# Patient Record
Sex: Female | Born: 1960 | Race: Black or African American | Hispanic: No | Marital: Single | Smoking: Former smoker
Health system: Southern US, Community
[De-identification: ages and names within clinical notes are randomized; demographics above are authoritative.]

## PROBLEM LIST (undated history)

## (undated) DIAGNOSIS — K219 Gastro-esophageal reflux disease without esophagitis: Secondary | ICD-10-CM

## (undated) DIAGNOSIS — I10 Essential (primary) hypertension: Secondary | ICD-10-CM

## (undated) DIAGNOSIS — IMO0001 Reserved for inherently not codable concepts without codable children: Secondary | ICD-10-CM

## (undated) DIAGNOSIS — K279 Peptic ulcer, site unspecified, unspecified as acute or chronic, without hemorrhage or perforation: Secondary | ICD-10-CM

## (undated) HISTORY — PX: UPPER GI ENDOSCOPY: SHX6162

## (undated) HISTORY — PX: COLONOSCOPY: SHX174

## (undated) HISTORY — PX: TUBAL LIGATION: SHX77

## (undated) HISTORY — PX: ESOPHAGEAL DILATION: SHX303

---

## 2002-02-09 ENCOUNTER — Emergency Department (HOSPITAL_COMMUNITY): Admission: EM | Admit: 2002-02-09 | Discharge: 2002-02-09 | Payer: Self-pay | Admitting: Emergency Medicine

## 2003-12-27 ENCOUNTER — Emergency Department (HOSPITAL_COMMUNITY): Admission: EM | Admit: 2003-12-27 | Discharge: 2003-12-27 | Payer: Self-pay | Admitting: Emergency Medicine

## 2004-03-10 ENCOUNTER — Emergency Department (HOSPITAL_COMMUNITY): Admission: EM | Admit: 2004-03-10 | Discharge: 2004-03-10 | Payer: Self-pay | Admitting: Emergency Medicine

## 2004-06-29 ENCOUNTER — Emergency Department (HOSPITAL_COMMUNITY): Admission: EM | Admit: 2004-06-29 | Discharge: 2004-06-29 | Payer: Self-pay | Admitting: Emergency Medicine

## 2004-07-14 ENCOUNTER — Encounter: Payer: Self-pay | Admitting: Internal Medicine

## 2004-07-18 ENCOUNTER — Encounter: Payer: Self-pay | Admitting: Internal Medicine

## 2004-07-18 ENCOUNTER — Encounter: Admission: RE | Admit: 2004-07-18 | Discharge: 2004-07-18 | Payer: Self-pay | Admitting: Gastroenterology

## 2004-07-21 ENCOUNTER — Encounter: Payer: Self-pay | Admitting: Internal Medicine

## 2005-12-31 ENCOUNTER — Emergency Department (HOSPITAL_COMMUNITY): Admission: EM | Admit: 2005-12-31 | Discharge: 2005-12-31 | Payer: Self-pay | Admitting: Emergency Medicine

## 2006-10-22 ENCOUNTER — Emergency Department (HOSPITAL_COMMUNITY): Admission: EM | Admit: 2006-10-22 | Discharge: 2006-10-22 | Payer: Self-pay | Admitting: Emergency Medicine

## 2007-01-09 ENCOUNTER — Emergency Department (HOSPITAL_COMMUNITY): Admission: EM | Admit: 2007-01-09 | Discharge: 2007-01-09 | Payer: Self-pay | Admitting: Emergency Medicine

## 2007-04-23 ENCOUNTER — Encounter: Payer: Self-pay | Admitting: Internal Medicine

## 2007-06-05 ENCOUNTER — Ambulatory Visit (HOSPITAL_COMMUNITY): Admission: RE | Admit: 2007-06-05 | Discharge: 2007-06-05 | Payer: Self-pay | Admitting: Obstetrics

## 2008-09-10 ENCOUNTER — Encounter: Payer: Self-pay | Admitting: Internal Medicine

## 2008-09-10 ENCOUNTER — Ambulatory Visit: Payer: Self-pay | Admitting: Internal Medicine

## 2008-09-10 DIAGNOSIS — D509 Iron deficiency anemia, unspecified: Secondary | ICD-10-CM | POA: Insufficient documentation

## 2008-09-10 DIAGNOSIS — I10 Essential (primary) hypertension: Secondary | ICD-10-CM | POA: Insufficient documentation

## 2008-09-10 DIAGNOSIS — K219 Gastro-esophageal reflux disease without esophagitis: Secondary | ICD-10-CM | POA: Insufficient documentation

## 2008-09-10 LAB — CONVERTED CEMR LAB
BUN: 14 mg/dL (ref 6–23)
CO2: 25 meq/L (ref 19–32)
Calcium: 9.1 mg/dL (ref 8.4–10.5)
Chloride: 106 meq/L (ref 96–112)
Creatinine, Ser: 0.7 mg/dL (ref 0.40–1.20)
Glucose, Bld: 98 mg/dL (ref 70–99)
HCT: 37.4 % (ref 36.0–46.0)
Hemoglobin: 11.4 g/dL — ABNORMAL LOW (ref 12.0–15.0)
MCHC: 30.5 g/dL (ref 30.0–36.0)
MCV: 83.7 fL (ref 78.0–100.0)
Platelets: 333 10*3/uL (ref 150–400)
Potassium: 5 meq/L (ref 3.5–5.3)
RBC: 4.47 M/uL (ref 3.87–5.11)
RDW: 20.3 % — ABNORMAL HIGH (ref 11.5–15.5)
Sodium: 141 meq/L (ref 135–145)
WBC: 7.9 10*3/uL (ref 4.0–10.5)

## 2009-11-09 ENCOUNTER — Emergency Department (HOSPITAL_COMMUNITY): Admission: EM | Admit: 2009-11-09 | Discharge: 2009-11-09 | Payer: Self-pay | Admitting: Family Medicine

## 2010-05-27 LAB — POCT URINALYSIS DIPSTICK
Glucose, UA: 100 mg/dL — AB
Ketones, ur: 15 mg/dL — AB
Nitrite: POSITIVE — AB
Protein, ur: >=300 mg/dL — AB
Specific Gravity, Urine: >=1.03 (ref 1.005–1.030)
Urobilinogen, UA: 2 mg/dL — ABNORMAL HIGH (ref 0.0–1.0)
pH: 5 (ref 5.0–8.0)

## 2010-07-29 NOTE — Op Note (Signed)
NAMEMARIABELEN, Wendy Jensen NO.:  [id number]   MEDICAL RECORD NO.:  [id number]          PATIENT TYPE:  EMS   LOCATION:  MAJO                         FACILITY:  [facility]   PHYSICIAN:  Anselmo Rod, M.D.  DATE OF BIRTH:  1961/01/31   DATE OF PROCEDURE:  06/29/2004  DATE OF DISCHARGE:  06/29/2004                                 OPERATIVE REPORT   PROCEDURE PERFORMED:  Esophagogastroduodenoscopy with disimpaction of a meat  bolus.   ENDOSCOPIST:  Charna Elizabeth, M.D.   INSTRUMENT USED:  Olympus video panendoscope.   INDICATIONS FOR PROCEDURE:  50 year old African American female with a  history of recurrent meat impactions undergoing EGD to remove the meat  bolus.   PREPROCEDURE PREPARATION:  Informed consent was procured from the patient.  The patient was in a fasting state for four hours prior to the procedure and  received Glucagon which did not help her symptoms.   PREPROCEDURE PHYSICAL:  Patient with stable vital signs.  Neck supple.  Chest clear to auscultation.  S1 and S2 regular.  Abdomen soft with normal  bowel sounds.   DESCRIPTION OF PROCEDURE:  The patient was placed in the left lateral  decubitus position, sedated with 75 mg of Demerol and 10 mg Versed in slow,  incremental doses.  Once the patient was adequately sedated, maintained on  low flow oxygen and continuous cardiac monitoring, the Olympus video  panendoscope was advanced through the mouth piece over the tongue into the  esophagus under direct vision.  A large piece of chicken was impacted in the  distal esophagus and was gently pushed into the stomach.  There was a large  amount of debris in the stomach and the procedure was aborted at this point  with plans to rescope the patient at a later date.   IMPRESSION:  1.  A large piece of chicken moved from distal esophagus into the stomach.  2.  A large amount of food in the stomach.  3.  Procedure aborted at that point.   RECOMMENDATIONS:  OTC  Prilosec has been advised.  Outpatient follow up  advised in the next two weeks, earlier if need be.  A soft diet is  recommended in the interim.      JNM/MEDQ  D:  07/01/2004  T:  07/01/2004  Job:  [id number]   cc:   Janine Limbo, M.D.  [address]  [place]  [place] [address]  Fax: [phone]

## 2010-12-26 LAB — WET PREP, GENITAL
Trich, Wet Prep: NONE SEEN
WBC, Wet Prep HPF POC: NONE SEEN
Yeast Wet Prep HPF POC: NONE SEEN

## 2010-12-26 LAB — CBC
HCT: 34.6 — ABNORMAL LOW
Hemoglobin: 11.5 — ABNORMAL LOW
MCHC: 33.3
MCV: 86.5
Platelets: 369
RBC: 4.01
RDW: 18.4 — ABNORMAL HIGH
WBC: 7.2

## 2010-12-26 LAB — DIFFERENTIAL
Basophils Absolute: 0.1
Basophils Relative: 1
Eosinophils Absolute: 0.3
Eosinophils Relative: 4
Lymphocytes Relative: 36
Lymphs Abs: 2.6
Monocytes Absolute: 0.5
Monocytes Relative: 6
Neutro Abs: 3.8
Neutrophils Relative %: 54

## 2010-12-26 LAB — GC/CHLAMYDIA PROBE AMP, GENITAL
Chlamydia, DNA Probe: NEGATIVE
GC Probe Amp, Genital: NEGATIVE

## 2010-12-26 LAB — POCT PREGNANCY, URINE
Operator id: 28833
Preg Test, Ur: NEGATIVE

## 2010-12-26 LAB — RPR: RPR Ser Ql: NONREACTIVE

## 2013-04-04 ENCOUNTER — Other Ambulatory Visit (HOSPITAL_COMMUNITY): Payer: Self-pay | Admitting: Obstetrics

## 2013-04-04 DIAGNOSIS — Z1231 Encounter for screening mammogram for malignant neoplasm of breast: Secondary | ICD-10-CM

## 2013-04-10 ENCOUNTER — Ambulatory Visit (HOSPITAL_COMMUNITY)
Admission: RE | Admit: 2013-04-10 | Discharge: 2013-04-10 | Disposition: A | Discharge status: Home / Self Care | Payer: BC Managed Care – PPO | Source: Ambulatory Visit | Attending: Obstetrics | Admitting: Obstetrics

## 2013-04-10 DIAGNOSIS — Z1231 Encounter for screening mammogram for malignant neoplasm of breast: Secondary | ICD-10-CM | POA: Insufficient documentation

## 2014-07-30 ENCOUNTER — Emergency Department (HOSPITAL_COMMUNITY)
Admission: EM | Admit: 2014-07-30 | Discharge: 2014-07-30 | Disposition: A | Discharge status: Home / Self Care | Payer: 59 | Attending: Emergency Medicine | Admitting: Emergency Medicine

## 2014-07-30 ENCOUNTER — Encounter (HOSPITAL_COMMUNITY): Payer: Self-pay | Admitting: Emergency Medicine

## 2014-07-30 DIAGNOSIS — S161XXA Strain of muscle, fascia and tendon at neck level, initial encounter: Secondary | ICD-10-CM | POA: Insufficient documentation

## 2014-07-30 DIAGNOSIS — Y939 Activity, unspecified: Secondary | ICD-10-CM | POA: Diagnosis not present

## 2014-07-30 DIAGNOSIS — S0990XA Unspecified injury of head, initial encounter: Secondary | ICD-10-CM | POA: Insufficient documentation

## 2014-07-30 DIAGNOSIS — S3992XA Unspecified injury of lower back, initial encounter: Secondary | ICD-10-CM | POA: Diagnosis present

## 2014-07-30 DIAGNOSIS — S46911A Strain of unspecified muscle, fascia and tendon at shoulder and upper arm level, right arm, initial encounter: Secondary | ICD-10-CM | POA: Insufficient documentation

## 2014-07-30 DIAGNOSIS — S46912A Strain of unspecified muscle, fascia and tendon at shoulder and upper arm level, left arm, initial encounter: Secondary | ICD-10-CM | POA: Insufficient documentation

## 2014-07-30 DIAGNOSIS — Y999 Unspecified external cause status: Secondary | ICD-10-CM | POA: Diagnosis not present

## 2014-07-30 DIAGNOSIS — Y92410 Unspecified street and highway as the place of occurrence of the external cause: Secondary | ICD-10-CM | POA: Diagnosis not present

## 2014-07-30 DIAGNOSIS — S39012A Strain of muscle, fascia and tendon of lower back, initial encounter: Secondary | ICD-10-CM | POA: Insufficient documentation

## 2014-07-30 DIAGNOSIS — Z8719 Personal history of other diseases of the digestive system: Secondary | ICD-10-CM | POA: Insufficient documentation

## 2014-07-30 DIAGNOSIS — S29019A Strain of muscle and tendon of unspecified wall of thorax, initial encounter: Secondary | ICD-10-CM | POA: Diagnosis not present

## 2014-07-30 DIAGNOSIS — T148XXA Other injury of unspecified body region, initial encounter: Secondary | ICD-10-CM

## 2014-07-30 HISTORY — DX: Reserved for inherently not codable concepts without codable children: IMO0001

## 2014-07-30 HISTORY — DX: Gastro-esophageal reflux disease without esophagitis: K21.9

## 2014-07-30 MED ORDER — METHOCARBAMOL 500 MG PO TABS: 500.0000 mg | ORAL_TABLET | Freq: Four times a day (QID) | ORAL | Status: DC
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
## 2014-07-30 NOTE — ED Notes (Signed)
Patient reports pain on the left side of the lower back. Patient ambulated into the room. Patient alert and oriented x4.

## 2014-07-30 NOTE — ED Notes (Signed)
PA at the bedside.

## 2015-10-05 ENCOUNTER — Emergency Department (HOSPITAL_COMMUNITY)
Admission: EM | Admit: 2015-10-05 | Discharge: 2015-10-05 | Disposition: A | Discharge status: Home / Self Care | Payer: Self-pay | Attending: Emergency Medicine | Admitting: Emergency Medicine

## 2015-10-05 ENCOUNTER — Encounter (HOSPITAL_COMMUNITY): Payer: Self-pay | Admitting: Vascular Surgery

## 2015-10-05 DIAGNOSIS — K029 Dental caries, unspecified: Secondary | ICD-10-CM | POA: Insufficient documentation

## 2015-10-05 DIAGNOSIS — K0889 Other specified disorders of teeth and supporting structures: Secondary | ICD-10-CM

## 2015-10-05 DIAGNOSIS — F17210 Nicotine dependence, cigarettes, uncomplicated: Secondary | ICD-10-CM | POA: Insufficient documentation

## 2015-10-05 DIAGNOSIS — Z79899 Other long term (current) drug therapy: Secondary | ICD-10-CM | POA: Insufficient documentation

## 2015-10-05 DIAGNOSIS — I10 Essential (primary) hypertension: Secondary | ICD-10-CM | POA: Insufficient documentation

## 2015-10-05 MED ORDER — PENICILLIN V POTASSIUM 500 MG PO TABS: 500.0000 mg | ORAL_TABLET | Freq: Four times a day (QID) | ORAL | 0 refills | Status: DC

## 2015-10-05 NOTE — ED Provider Notes (Signed)
[facility] Provider Note   CSN: [id number] Arrival date & time: 10/05/15  4098  First Provider Contact: First MD initiated contact with patient 10/05/15 9:30 AM     By signing my name below, I, Doreatha Martin, attest that this documentation has been prepared under the direction and in the presence of Roxy Horseman, PA-C. Electronically Signed: Doreatha Martin, ED Scribe. 10/05/15. 9:34 AM.   History   Chief Complaint Chief Complaint  Patient presents with  . Dental Pain    HPI Wendy Jensen is a 55 y.o. female who presents to the Emergency Department complaining of moderate, chronic bilateral dental pain onset this week. Pt notes that she has loose tooth and cavities that contribute to her pain. She reports her pain is worsened with closing her jaw or eating. She notes that her pain is not relieved by [facility] Powder. She is not currently followed by a dentist. She denies fever, difficulty swallowing or tolerating secretions. NKDA.   The history is provided by the patient. No language interpreter was used.    Past Medical History:  Diagnosis Date  . Reflux     Patient Active Problem List   Diagnosis Date Noted  . ANEMIA, IRON DEFICIENCY 09/10/2008  . HYPERTENSION 09/10/2008  . GERD 09/10/2008    Past Surgical History:  Procedure Laterality Date  . ESOPHAGEAL DILATION    . TUBAL LIGATION      OB History    No data available       Home Medications    Prior to Admission medications   Medication Sig Start Date End Date Taking? Authorizing Provider  methocarbamol (ROBAXIN) 500 MG tablet Take 1 tablet (500 mg total) by mouth 4 (four) times daily. 07/30/14   Renne Crigler, PA-C    Family History No family history on file.  Social History Social History  Substance Use Topics  . Smoking status: Current Every Day Smoker    Packs/day: 0.50    Types: Cigarettes  . Smokeless tobacco: Never Used  . Alcohol use Yes     Comment: 1 glass beer per day      Allergies   Review of patient's allergies indicates no known allergies.   Review of Systems Review of Systems  Constitutional: Negative for fever.  HENT: Positive for dental problem. Negative for trouble swallowing.      Physical Exam Updated Vital Signs BP 147/100 (BP Location: Right Arm)   Pulse 90   Temp 98.7 F (37.1 C) (Oral)   Resp 18   Ht  (1.626 m)   Wt 180 lb (81.6 kg)   SpO2 97%   BMI 30.90 kg/m   Physical Exam Physical Exam  Constitutional: Pt appears well-developed and well-nourished.  HENT:  Head: Normocephalic.  Right Ear: Tympanic membrane, external ear and ear canal normal.  Left Ear: Tympanic membrane, external ear and ear canal normal.  Nose: Nose normal. Right sinus exhibits no maxillary sinus tenderness and no frontal sinus tenderness. Left sinus exhibits no maxillary sinus tenderness and no frontal sinus tenderness.  Mouth/Throat: Uvula is midline, oropharynx is clear and moist and mucous membranes are normal. No oral lesions. No uvula swelling or lacerations. No oropharyngeal exudate, posterior oropharyngeal edema, posterior oropharyngeal erythema or tonsillar abscesses.  Poor dentition No gingival swelling, fluctuance or induration No gross abscess  No sublingual edema, tenderness to palpation, or sign of Ludwig's angina, or deep space infection Pain at right rear lower molars Eyes: Conjunctivae are normal. Pupils are equal, round, and reactive  to light. Right eye exhibits no discharge. Left eye exhibits no discharge.  Neck: Normal range of motion. Neck supple.  No stridor Handling secretions without difficulty No nuchal rigidity No cervical lymphadenopathy Cardiovascular: Normal rate, regular rhythm and normal heart sounds.   Pulmonary/Chest: Effort normal. No respiratory distress.  Equal chest rise  Abdominal: Soft. Bowel sounds are normal. Pt exhibits no distension. There is no tenderness.  Lymphadenopathy: Pt has no cervical  adenopathy.  Neurological: Pt is alert and oriented x 4  Skin: Skin is warm and dry.  Psychiatric: Pt has a normal mood and affect.  Nursing note and vitals reviewed.    ED Treatments / Results  Labs (all labs ordered are listed, but only abnormal results are displayed) Labs Reviewed - No data to display  EKG  EKG Interpretation None       Radiology No results found.  Procedures Procedures (including critical care time) DIAGNOSTIC STUDIES: Oxygen Saturation is 97% on RA, normal by my interpretation.    COORDINATION OF CARE: 9:32 AM Discussed treatment plan with pt at bedside which includes dental referral and pt agreed to plan.    Medications Ordered in ED Medications - No data to display   Initial Impression / Assessment and Plan / ED Course  I have reviewed the triage vital signs and the nursing notes.  Pertinent labs & imaging results that were available during my care of the patient were reviewed by me and considered in my medical decision making (see chart for details).  Clinical Course    Patient with dentalgia.  No abscess requiring immediate incision and drainage.  Exam not concerning for Ludwig's angina or pharyngeal abscess.  Will treat with Penicillin. Pt instructed to follow-up with dentist.  Discussed return precautions. Pt safe for discharge.   Final Clinical Impressions(s) / ED Diagnoses   Final diagnoses:  Pain, dental  Dental caries    New Prescriptions New Prescriptions   No medications on file    I personally performed the services described in this documentation, which was scribed in my presence. The recorded information has been reviewed and is accurate.      Roxy Horseman, PA-C 10/05/15 8756    Raeford Razor, MD 10/09/15 1500

## 2015-10-05 NOTE — ED Triage Notes (Signed)
Pt reports to the ED for eval of dental pain x 1 week. She reports she has 3 loose teeth and it is so painful that is makes it hard to eat. Pt requesting dental resource guide. Pt A&Ox4, resp e/u, and skin warm and dry.

## 2015-11-02 ENCOUNTER — Emergency Department (HOSPITAL_COMMUNITY)
Admission: EM | Admit: 2015-11-02 | Discharge: 2015-11-02 | Disposition: A | Discharge status: Home / Self Care | Payer: BLUE CROSS/BLUE SHIELD | Attending: Emergency Medicine | Admitting: Emergency Medicine

## 2015-11-02 ENCOUNTER — Encounter (HOSPITAL_COMMUNITY): Payer: Self-pay | Admitting: Emergency Medicine

## 2015-11-02 DIAGNOSIS — I10 Essential (primary) hypertension: Secondary | ICD-10-CM | POA: Insufficient documentation

## 2015-11-02 DIAGNOSIS — F17210 Nicotine dependence, cigarettes, uncomplicated: Secondary | ICD-10-CM | POA: Diagnosis not present

## 2015-11-02 DIAGNOSIS — Z79899 Other long term (current) drug therapy: Secondary | ICD-10-CM | POA: Insufficient documentation

## 2015-11-02 DIAGNOSIS — R591 Generalized enlarged lymph nodes: Secondary | ICD-10-CM | POA: Insufficient documentation

## 2015-11-02 LAB — CBC WITH DIFFERENTIAL/PLATELET
Basophils Absolute: 0.1 10*3/uL (ref 0.0–0.1)
Basophils Relative: 2 %
Eosinophils Absolute: 0.1 10*3/uL (ref 0.0–0.7)
Eosinophils Relative: 2 %
HCT: 43.8 % (ref 36.0–46.0)
Hemoglobin: 14.3 g/dL (ref 12.0–15.0)
Lymphocytes Relative: 40 %
Lymphs Abs: 2.9 10*3/uL (ref 0.7–4.0)
MCH: 30.6 pg (ref 26.0–34.0)
MCHC: 32.6 g/dL (ref 30.0–36.0)
MCV: 93.6 fL (ref 78.0–100.0)
Monocytes Absolute: 0.5 10*3/uL (ref 0.1–1.0)
Monocytes Relative: 7 %
Neutro Abs: 3.6 10*3/uL (ref 1.7–7.7)
Neutrophils Relative %: 49 %
Platelets: 221 10*3/uL (ref 150–400)
RBC: 4.68 MIL/uL (ref 3.87–5.11)
RDW: 13.2 % (ref 11.5–15.5)
WBC: 7.2 10*3/uL (ref 4.0–10.5)

## 2015-11-02 LAB — BASIC METABOLIC PANEL
Anion gap: 5 (ref 5–15)
BUN: 13 mg/dL (ref 6–20)
CO2: 28 mmol/L (ref 22–32)
Calcium: 9.1 mg/dL (ref 8.9–10.3)
Chloride: 106 mmol/L (ref 101–111)
Creatinine, Ser: 0.71 mg/dL (ref 0.44–1.00)
GFR calc Af Amer: >60 mL/min (ref >60)
GFR calc non Af Amer: >60 mL/min (ref >60)
Glucose, Bld: 91 mg/dL (ref 65–99)
Potassium: 4 mmol/L (ref 3.5–5.1)
Sodium: 139 mmol/L (ref 135–145)

## 2015-11-02 NOTE — ED Provider Notes (Signed)
[facility] Provider Note   CSN: [id number] Arrival date & time: 11/02/15  1112     History   Chief Complaint Chief Complaint  Patient presents with  . Lymphadenopathy    HPI Wendy Jensen is a 55 y.o. female.  HPI   Pt is a 55 y.o. Female with a history of anemia, HTN and GERD who presents to the emergency department with swollen lymph nodes on both sides of her neck for 3 days. She states they are only painful to touch. She has not tried anything and nothing makes them better. Pt had teeth removed roughly 3 weeks ago and only took part of her penicillin. She took 2 days worth of penicillin in the last 2 days without change in the lymph nodes. Pt is complaining of neck tightness on the right side since this morning. Pt believes she slept on her neck wrong as she was trying to avoid putting pressure on the nodes. Pt denies headache, sore throat, dental pain, swelling of her gingiva, fevers, nausea, vomiting, visual changes.   Past Medical History:  Diagnosis Date  . Reflux     Patient Active Problem List   Diagnosis Date Noted  . ANEMIA, IRON DEFICIENCY 09/10/2008  . HYPERTENSION 09/10/2008  . GERD 09/10/2008    Past Surgical History:  Procedure Laterality Date  . ESOPHAGEAL DILATION    . TUBAL LIGATION      OB History    No data available       Home Medications    Prior to Admission medications   Medication Sig Start Date End Date Taking? Authorizing Provider  OMEPRAZOLE PO Take by mouth.   Yes Historical Provider, MD  methocarbamol (ROBAXIN) 500 MG tablet Take 1 tablet (500 mg total) by mouth 4 (four) times daily. 07/30/14   Renne CriglerJoshua Geiple, PA-C  penicillin v potassium (VEETID) 500 MG tablet Take 1 tablet (500 mg total) by mouth 4 (four) times daily. 10/05/15   Roxy Horsemanobert Browning, PA-C    Family History History reviewed. No pertinent family history.  Social History Social History  Substance Use Topics  . Smoking status: Current Every Day Smoker      Packs/day: 0.50    Types: Cigarettes  . Smokeless tobacco: Never Used  . Alcohol use Yes     Comment: 1 glass beer per day     Allergies   Review of patient's allergies indicates no known allergies.   Review of Systems Review of Systems  Constitutional: Negative for chills, diaphoresis and fever.  HENT: Positive for congestion and postnasal drip. Negative for dental problem, ear pain, sore throat and trouble swallowing.        Swollen lymph nodes  Eyes: Negative for visual disturbance.  Respiratory: Negative for chest tightness and shortness of breath.   Cardiovascular: Negative for chest pain.  Gastrointestinal: Negative for abdominal pain, nausea and vomiting.  Genitourinary: Negative for dysuria and hematuria.  Musculoskeletal: Negative for back pain.  Skin: Negative for rash.  Neurological: Negative for dizziness, syncope and headaches.     Physical Exam Updated Vital Signs BP (!) 159/105 (BP Location: Right Arm)   Pulse 71   Temp 98.1 F (36.7 C) (Oral)   Resp 18   SpO2 96%   Physical Exam  Constitutional: She appears well-developed and well-nourished. No distress.  HENT:  Head: Normocephalic and atraumatic.  Right Ear: Tympanic membrane and ear canal normal.  Left Ear: Tympanic membrane and ear canal normal.  Mouth/Throat: Uvula is midline, oropharynx is clear  and moist and mucous membranes are normal. No trismus in the jaw. Abnormal dentition. No dental abscesses or uvula swelling.  Eyes: Conjunctivae are normal.  Neck: Trachea normal and normal range of motion. Muscular tenderness (trapezius right > left) present. No tracheal tenderness and no spinous process tenderness present. No neck rigidity. No tracheal deviation, no erythema and normal range of motion present. No thyroid mass and no thyromegaly present.  Cardiovascular: Normal rate and normal heart sounds.  Exam reveals no gallop and no friction rub.   No murmur heard. Pulmonary/Chest: Effort normal.  No respiratory distress.  Musculoskeletal: Normal range of motion.  Lymphadenopathy:       Head (right side): Submandibular and preauricular adenopathy present.       Head (left side): Submandibular and preauricular adenopathy present.    She has cervical adenopathy.       Right cervical: Superficial cervical adenopathy present. No posterior cervical adenopathy present.      Left cervical: Superficial cervical adenopathy present. No posterior cervical adenopathy present.       Right: No supraclavicular adenopathy present.       Left: No supraclavicular adenopathy present.  Neurological: She is alert. Coordination normal.  Skin: Skin is warm and dry. She is not diaphoretic.  Psychiatric: She has a normal mood and affect. Her behavior is normal.  Nursing note and vitals reviewed.    ED Treatments / Results  Labs (all labs ordered are listed, but only abnormal results are displayed) Labs Reviewed  CBC WITH DIFFERENTIAL/PLATELET  BASIC METABOLIC PANEL    EKG  EKG Interpretation None       Radiology No results found.  Procedures Procedures (including critical care time)  Medications Ordered in ED Medications - No data to display   Initial Impression / Assessment and Plan / ED Course  I have reviewed the triage vital signs and the nursing notes.  Pertinent labs & imaging results that were available during my care of the patient were reviewed by me and considered in my medical decision making (see chart for details).  Clinical Course   Patient with bilateral lymphadenopathy of her neck. This is likely viral in nature. Patient also states she had a rash that resolved on her face around the time of the onset of lymphadenopathy. This could be reactive in nature. I did discuss with the patient the different etiologies of swollen lymph nodes. I discussed this could be viral, bacterial, reactive, or cancer. Patient's labs are unremarkable. Her CBC did show atypical lymphocytes  although WBC was normal. Less likely that this is lymphoma. PT endorses chronic night sweats due to menopause. No change in weight or frequency of night sweats.  I discussed with the patient that if symptoms do not improve in 3-4 days she needs to be seen by her primary care provider or return to emergency department to be reevaluated. I also discussed strict return precautions to include but not limited to B symptoms. Instructed pt to discuss her elevated BP with her PCP.   Pt expressed understanding to the discharge instructions.   Case discussed with Dr. Clayborne DanaMesner who agrees with the above plan.   Final Clinical Impressions(s) / ED Diagnoses   Final diagnoses:  Lymphadenopathy    New Prescriptions New Prescriptions   No medications on file     Wendy Jensen, GeorgiaPA 11/02/15 1458    Marily MemosJason Mesner, MD 11/03/15 1622

## 2015-11-02 NOTE — Discharge Instructions (Signed)
You were seen today for swollen lymph nodes. Your blood work was reassuring except for atypical lymphocytes. The cause is likely a virus but if your symptoms do not improve in 3-4 days you need to follow up with your primary care provider or return to the emergency department. Return to the emergency department sooner if you experience weakness, fevers, increased lymph node swelling, difficulty swallowing or any other concerning symptoms.

## 2015-11-02 NOTE — ED Triage Notes (Signed)
Pt has lymph node swelling in bilateral neck x 3 days with pain

## 2015-11-02 NOTE — ED Notes (Signed)
Pain in jaw and throar staes she has some swelling in her the sides of her neck had some teeth pulled about 7/17 and did not finish her antibiotics but when this started on sat she did

## 2015-12-07 ENCOUNTER — Other Ambulatory Visit: Payer: Self-pay | Admitting: Gastroenterology

## 2015-12-07 DIAGNOSIS — R1012 Left upper quadrant pain: Secondary | ICD-10-CM

## 2015-12-07 DIAGNOSIS — R634 Abnormal weight loss: Secondary | ICD-10-CM

## 2015-12-14 ENCOUNTER — Other Ambulatory Visit: Payer: BLUE CROSS/BLUE SHIELD

## 2016-10-23 ENCOUNTER — Ambulatory Visit: Payer: BLUE CROSS/BLUE SHIELD | Admitting: Family Medicine

## 2017-06-12 ENCOUNTER — Other Ambulatory Visit: Payer: Self-pay | Admitting: Gastroenterology

## 2017-06-12 DIAGNOSIS — R1011 Right upper quadrant pain: Secondary | ICD-10-CM

## 2017-07-03 ENCOUNTER — Ambulatory Visit
Admission: RE | Admit: 2017-07-03 | Discharge: 2017-07-03 | Disposition: A | Discharge status: Home / Self Care | Payer: BLUE CROSS/BLUE SHIELD | Source: Ambulatory Visit | Attending: Gastroenterology | Admitting: Gastroenterology

## 2017-07-03 DIAGNOSIS — R1011 Right upper quadrant pain: Secondary | ICD-10-CM

## 2017-07-09 ENCOUNTER — Other Ambulatory Visit: Payer: BLUE CROSS/BLUE SHIELD

## 2017-07-31 DIAGNOSIS — F411 Generalized anxiety disorder: Secondary | ICD-10-CM | POA: Insufficient documentation

## 2017-07-31 DIAGNOSIS — F17200 Nicotine dependence, unspecified, uncomplicated: Secondary | ICD-10-CM | POA: Insufficient documentation

## 2017-07-31 DIAGNOSIS — J309 Allergic rhinitis, unspecified: Secondary | ICD-10-CM | POA: Insufficient documentation

## 2017-11-30 ENCOUNTER — Encounter (HOSPITAL_COMMUNITY): Payer: Self-pay | Admitting: Emergency Medicine

## 2017-11-30 ENCOUNTER — Emergency Department (HOSPITAL_COMMUNITY)
Admission: EM | Admit: 2017-11-30 | Discharge: 2017-12-01 | Disposition: A | Discharge status: Home / Self Care | Payer: BLUE CROSS/BLUE SHIELD | Attending: Emergency Medicine | Admitting: Emergency Medicine

## 2017-11-30 DIAGNOSIS — I10 Essential (primary) hypertension: Secondary | ICD-10-CM | POA: Diagnosis present

## 2017-11-30 DIAGNOSIS — F17210 Nicotine dependence, cigarettes, uncomplicated: Secondary | ICD-10-CM | POA: Insufficient documentation

## 2017-11-30 DIAGNOSIS — Z79899 Other long term (current) drug therapy: Secondary | ICD-10-CM | POA: Diagnosis not present

## 2017-11-30 DIAGNOSIS — R42 Dizziness and giddiness: Secondary | ICD-10-CM | POA: Diagnosis not present

## 2017-11-30 HISTORY — DX: Gastro-esophageal reflux disease without esophagitis: K21.9

## 2017-11-30 HISTORY — DX: Essential (primary) hypertension: I10

## 2017-11-30 LAB — CBC WITH DIFFERENTIAL/PLATELET
Abs Immature Granulocytes: 0 10*3/uL (ref 0.0–0.1)
Basophils Absolute: 0.1 10*3/uL (ref 0.0–0.1)
Basophils Relative: 1 %
Eosinophils Absolute: 0.2 10*3/uL (ref 0.0–0.7)
Eosinophils Relative: 2 %
HCT: 41 % (ref 36.0–46.0)
Hemoglobin: 13.2 g/dL (ref 12.0–15.0)
Immature Granulocytes: 0 %
Lymphocytes Relative: 33 %
Lymphs Abs: 3.2 10*3/uL (ref 0.7–4.0)
MCH: 31.4 pg (ref 26.0–34.0)
MCHC: 32.2 g/dL (ref 30.0–36.0)
MCV: 97.6 fL (ref 78.0–100.0)
Monocytes Absolute: 0.6 10*3/uL (ref 0.1–1.0)
Monocytes Relative: 6 %
Neutro Abs: 5.6 10*3/uL (ref 1.7–7.7)
Neutrophils Relative %: 58 %
Platelets: 273 10*3/uL (ref 150–400)
RBC: 4.2 MIL/uL (ref 3.87–5.11)
RDW: 13 % (ref 11.5–15.5)
WBC: 9.6 10*3/uL (ref 4.0–10.5)

## 2017-11-30 LAB — COMPREHENSIVE METABOLIC PANEL
ALT: 16 U/L (ref 0–44)
AST: 21 U/L (ref 15–41)
Albumin: 3.8 g/dL (ref 3.5–5.0)
Alkaline Phosphatase: 82 U/L (ref 38–126)
Anion gap: 9 (ref 5–15)
BUN: 15 mg/dL (ref 6–20)
CO2: 26 mmol/L (ref 22–32)
Calcium: 8.9 mg/dL (ref 8.9–10.3)
Chloride: 105 mmol/L (ref 98–111)
Creatinine, Ser: 0.82 mg/dL (ref 0.44–1.00)
GFR calc Af Amer: >60 mL/min (ref >60)
GFR calc non Af Amer: >60 mL/min (ref >60)
Glucose, Bld: 108 mg/dL — ABNORMAL HIGH (ref 70–99)
Potassium: 3.3 mmol/L — ABNORMAL LOW (ref 3.5–5.1)
Sodium: 140 mmol/L (ref 135–145)
Total Bilirubin: 0.6 mg/dL (ref 0.3–1.2)
Total Protein: 7.3 g/dL (ref 6.5–8.1)

## 2017-11-30 NOTE — ED Triage Notes (Signed)
Patient reports elevated blood pressure at today systolic 200+ , BP= 205/112 at triage , denies pain or discomfort , respirations unlabored . Currently on antihypertensive medication.

## 2017-11-30 NOTE — ED Notes (Signed)
Dr. Silverio LayYao ( EDP) notified on pt.'s hypertension .

## 2017-11-30 NOTE — ED Notes (Signed)
Nurse notified about Pt's blood pressure 199/105.

## 2017-12-01 NOTE — Discharge Instructions (Addendum)
Take 2 of your lisinopril (blood pressure) tablets daily.  Check your blood pressure once a day, write it down and bring it to your doctor's appointment.  Start taking the potassium that your doctor prescribed.

## 2017-12-01 NOTE — ED Notes (Signed)
ED Provider at bedside. 

## 2017-12-01 NOTE — ED Provider Notes (Signed)
[facility] EMERGENCY DEPARTMENT Provider Note   CSN: [id number] Arrival date & time: 11/30/17  1856     History   Chief Complaint Chief Complaint  Patient presents with  . Hypertension    HPI Wendy Jensen is a 57 y.o. female.  Patient presents to the ER for evaluation of elevated blood pressure.  Patient has a history of hypertension, was placed on lisinopril 4 months ago by her primary doctor.  She reports that she started to feel dizzy at work today, when checked her blood pressure at a drugstore.  It was elevated.  She took an extra lisinopril (she takes 10 mg tablets) and came to the ER.  Dizziness has resolved, she has not had any chest pain, heart palpitations, shortness of breath, syncope.     Past Medical History:  Diagnosis Date  . GERD (gastroesophageal reflux disease)   . Hypertension   . Reflux     Patient Active Problem List   Diagnosis Date Noted  . ANEMIA, IRON DEFICIENCY 09/10/2008  . HYPERTENSION 09/10/2008  . GERD 09/10/2008    Past Surgical History:  Procedure Laterality Date  . ESOPHAGEAL DILATION    . TUBAL LIGATION       OB History   None      Home Medications    Prior to Admission medications   Medication Sig Start Date End Date Taking? Authorizing Provider  lisinopril-hydrochlorothiazide (PRINZIDE,ZESTORETIC) 20-25 MG tablet Take 1 tablet by mouth daily. 10/24/17  Yes [provider]  potassium chloride (K-DUR) 10 MEQ tablet Take 10 mEq by mouth daily. 10/24/17  Yes [provider]  methocarbamol (ROBAXIN) 500 MG tablet Take 1 tablet (500 mg total) by mouth 4 (four) times daily. Patient not taking: Reported on 12/01/2017 07/30/14   Renne Crigler, PA-C  penicillin v potassium (VEETID) 500 MG tablet Take 1 tablet (500 mg total) by mouth 4 (four) times daily. Patient not taking: Reported on 12/01/2017 10/05/15   Roxy Horseman, PA-C    Family History No family history on file.  Social  History Social History   Tobacco Use  . Smoking status: Current Every Day Smoker    Packs/day: 0.50    Types: Cigarettes  . Smokeless tobacco: Never Used  Substance Use Topics  . Alcohol use: Yes    Comment: 1 glass beer per day  . Drug use: No     Allergies   Patient has no known allergies.   Review of Systems Review of Systems  Neurological: Positive for dizziness.  All other systems reviewed and are negative.    Physical Exam Updated Vital Signs BP (!) 161/107   Pulse 89   Temp 98.5 F (36.9 C) (Oral)   Resp 16   Ht 5\' 4"  (1.626 m)   Wt 79.4 kg   SpO2 98%   BMI 30.04 kg/m   Physical Exam  Constitutional: She is oriented to person, place, and time. She appears well-developed and well-nourished. No distress.  HENT:  Head: Normocephalic and atraumatic.  Right Ear: Hearing normal.  Left Ear: Hearing normal.  Nose: Nose normal.  Mouth/Throat: Oropharynx is clear and moist and mucous membranes are normal.  Eyes: Pupils are equal, round, and reactive to light. Conjunctivae and EOM are normal.  Neck: Normal range of motion. Neck supple.  Cardiovascular: Regular rhythm, S1 normal and S2 normal. Exam reveals no gallop and no friction rub.  No murmur heard. Pulmonary/Chest: Effort normal and breath sounds normal. No respiratory distress. She exhibits no  tenderness.  Abdominal: Soft. Normal appearance and bowel sounds are normal. There is no hepatosplenomegaly. There is no tenderness. There is no rebound, no guarding, no tenderness at McBurney's point and negative Murphy's sign. No hernia.  Musculoskeletal: Normal range of motion.  Neurological: She is alert and oriented to person, place, and time. She has normal strength. No cranial nerve deficit or sensory deficit. Coordination normal. GCS eye subscore is 4. GCS verbal subscore is 5. GCS motor subscore is 6.  Skin: Skin is warm, dry and intact. No rash noted. No cyanosis.  Psychiatric: She has a normal mood and  affect. Her speech is normal and behavior is normal. Thought content normal.  Nursing note and vitals reviewed.    ED Treatments / Results  Labs (all labs ordered are listed, but only abnormal results are displayed) Labs Reviewed  COMPREHENSIVE METABOLIC PANEL - Abnormal; Notable for the following components:      Result Value   Potassium 3.3 (*)    Glucose, Bld 108 (*)    All other components within normal limits  CBC WITH DIFFERENTIAL/PLATELET    EKG None  Radiology No results found.  Procedures Procedures (including critical care time)  Medications Ordered in ED Medications - No data to display   Initial Impression / Assessment and Plan / ED Course  I have reviewed the triage vital signs and the nursing notes.  Pertinent labs & imaging results that were available during my care of the patient were reviewed by me and considered in my medical decision making (see chart for details).     Patient presents to the emergency department for evaluation of elevated blood pressure.  She checked her pressure prior to arrival because of dizziness.  She has a normal neurologic exam, labs are unremarkable.  She did have borderline low potassium.  She has been given potassium supplementation by her primary doctor but is not taking it.  She was counseled that she needs to start taking the potassium and will double her lisinopril.  She will check her blood pressure daily.  She has follow-up scheduled within the next week with her primary doctor for recheck.  Final Clinical Impressions(s) / ED Diagnoses   Final diagnoses:  Essential hypertension    ED Discharge Orders    None       Pollina, Canary Brimhristopher J, MD 12/01/17 [phone]

## 2017-12-06 ENCOUNTER — Encounter (HOSPITAL_COMMUNITY): Payer: Self-pay

## 2017-12-06 ENCOUNTER — Other Ambulatory Visit: Payer: Self-pay

## 2017-12-06 ENCOUNTER — Emergency Department (HOSPITAL_COMMUNITY)
Admission: EM | Admit: 2017-12-06 | Discharge: 2017-12-06 | Disposition: A | Discharge status: Home / Self Care | Payer: BLUE CROSS/BLUE SHIELD | Attending: Emergency Medicine | Admitting: Emergency Medicine

## 2017-12-06 DIAGNOSIS — R197 Diarrhea, unspecified: Secondary | ICD-10-CM | POA: Insufficient documentation

## 2017-12-06 DIAGNOSIS — F17210 Nicotine dependence, cigarettes, uncomplicated: Secondary | ICD-10-CM | POA: Insufficient documentation

## 2017-12-06 DIAGNOSIS — Z79899 Other long term (current) drug therapy: Secondary | ICD-10-CM | POA: Insufficient documentation

## 2017-12-06 DIAGNOSIS — I10 Essential (primary) hypertension: Secondary | ICD-10-CM

## 2017-12-06 NOTE — ED Triage Notes (Signed)
Patient states she took Sertraline 25 mg 2 tabs at 0600 today. Patient states she has been feeling drowsy and tired today.  Patient realized that her BP was high because she did not take her Lisonipril today

## 2017-12-06 NOTE — ED Provider Notes (Signed)
[facility]-EMERGENCY DEPT Provider Note   CSN: [id number] Arrival date & time: 12/06/17  1654     History   Chief Complaint Chief Complaint  Patient presents with  . Hypertension  . took wrong medication    HPI Wendy Jensen is a 57 y.o. female.  HPI Patient presents because her blood pressure was elevated.  States she came in because her blood pressure been running high.  States she is on the blood pressure medicine but now realizes she did not take it this morning.  States she accidentally took 2 of her sertraline instead.  States she had not been taking the sertraline but thinks she took out of the wrong bottle today.  Had had some diarrhea today.  States she felt a little off but is feeling somewhat better now.  She is nervous about the blood pressure and taking the extra medicine.  She has appointment with her primary care doctor and is recently seen him for blood pressure change.  States she has not refilled the new medicine yet however. Past Medical History:  Diagnosis Date  . GERD (gastroesophageal reflux disease)   . Hypertension   . Reflux     Patient Active Problem List   Diagnosis Date Noted  . ANEMIA, IRON DEFICIENCY 09/10/2008  . HYPERTENSION 09/10/2008  . GERD 09/10/2008    Past Surgical History:  Procedure Laterality Date  . ESOPHAGEAL DILATION    . TUBAL LIGATION       OB History   None      Home Medications    Prior to Admission medications   Medication Sig Start Date End Date Taking? Authorizing Provider  lisinopril-hydrochlorothiazide (PRINZIDE,ZESTORETIC) 20-25 MG tablet Take 1 tablet by mouth daily. 10/24/17   [provider]  methocarbamol (ROBAXIN) 500 MG tablet Take 1 tablet (500 mg total) by mouth 4 (four) times daily. Patient not taking: Reported on 12/01/2017 07/30/14   Renne Crigler, PA-C  penicillin v potassium (VEETID) 500 MG tablet Take 1 tablet (500 mg total) by mouth 4 (four) times  daily. Patient not taking: Reported on 12/01/2017 10/05/15   Roxy Horseman, PA-C  potassium chloride (K-DUR) 10 MEQ tablet Take 10 mEq by mouth daily. 10/24/17   [provider]    Family History Family History  Problem Relation Age of Onset  . Hypertension Father     Social History Social History   Tobacco Use  . Smoking status: Current Every Day Smoker    Packs/day: 0.50    Types: Cigarettes  . Smokeless tobacco: Never Used  Substance Use Topics  . Alcohol use: Yes    Comment: 1 glass beer per day  . Drug use: No     Allergies   Patient has no known allergies.   Review of Systems Review of Systems  Constitutional: Negative for activity change.  HENT: Negative for congestion.   Respiratory: Negative for shortness of breath.   Cardiovascular: Negative for chest pain.  Gastrointestinal: Positive for diarrhea.  Genitourinary: Negative for flank pain.  Musculoskeletal: Negative for back pain.  Skin: Negative for rash.  Neurological: Negative for weakness.  Hematological: Negative for adenopathy.  Psychiatric/Behavioral: Negative for confusion.     Physical Exam Updated Vital Signs BP (!) 178/98   Pulse 74   Temp 98.2 F (36.8 C) (Oral)   Resp 18   Ht 5\' 4"  (1.626 m)   Wt 74.3 kg   SpO2 98%   BMI 28.10 kg/m   Physical Exam  Constitutional:  She appears well-developed.  HENT:  Head: Atraumatic.  Neck: Neck supple.  Cardiovascular: Normal rate.  Pulmonary/Chest: Effort normal.  Abdominal: Soft. There is no tenderness.  Musculoskeletal: She exhibits no edema.  Neurological: She is alert.  Skin: Skin is warm. Capillary refill takes less than 2 seconds.     ED Treatments / Results  Labs (all labs ordered are listed, but only abnormal results are displayed) Labs Reviewed - No data to display  EKG None  Radiology No results found.  Procedures Procedures (including critical care time)  Medications Ordered in ED Medications - No data  to display   Initial Impression / Assessment and Plan / ED Course  I have reviewed the triage vital signs and the nursing notes.  Pertinent labs & imaging results that were available during my care of the patient were reviewed by me and considered in my medical decision making (see chart for details).     Patient with hypertension.  Had not had her blood pressure medicine.  Doubt endorgan damage.  Also not worried about the sertraline dose.  Has follow-up for blood pressure.  Will discharge home per  Final Clinical Impressions(s) / ED Diagnoses   Final diagnoses:  Hypertension, unspecified type    ED Discharge Orders    None       Benjiman Core, MD 12/06/17 2038

## 2017-12-06 NOTE — Discharge Instructions (Addendum)
Take care with your medications.  Follow-up with your doctor for further management of your blood pressure.

## 2017-12-10 DIAGNOSIS — E782 Mixed hyperlipidemia: Secondary | ICD-10-CM | POA: Insufficient documentation

## 2017-12-27 ENCOUNTER — Encounter (HOSPITAL_COMMUNITY): Payer: Self-pay

## 2017-12-27 ENCOUNTER — Emergency Department (HOSPITAL_COMMUNITY)
Admission: EM | Admit: 2017-12-27 | Discharge: 2017-12-27 | Disposition: A | Discharge status: Home / Self Care | Payer: BLUE CROSS/BLUE SHIELD | Attending: Emergency Medicine | Admitting: Emergency Medicine

## 2017-12-27 ENCOUNTER — Other Ambulatory Visit: Payer: Self-pay

## 2017-12-27 DIAGNOSIS — F17210 Nicotine dependence, cigarettes, uncomplicated: Secondary | ICD-10-CM | POA: Diagnosis not present

## 2017-12-27 DIAGNOSIS — I10 Essential (primary) hypertension: Secondary | ICD-10-CM | POA: Insufficient documentation

## 2017-12-27 DIAGNOSIS — R112 Nausea with vomiting, unspecified: Secondary | ICD-10-CM | POA: Diagnosis present

## 2017-12-27 DIAGNOSIS — Z79899 Other long term (current) drug therapy: Secondary | ICD-10-CM | POA: Diagnosis not present

## 2017-12-27 DIAGNOSIS — R197 Diarrhea, unspecified: Secondary | ICD-10-CM | POA: Insufficient documentation

## 2017-12-27 LAB — COMPREHENSIVE METABOLIC PANEL
ALT: 18 U/L (ref 0–44)
AST: 24 U/L (ref 15–41)
Albumin: 3.4 g/dL — ABNORMAL LOW (ref 3.5–5.0)
Alkaline Phosphatase: 62 U/L (ref 38–126)
Anion gap: 9 (ref 5–15)
BUN: 9 mg/dL (ref 6–20)
CO2: 28 mmol/L (ref 22–32)
Calcium: 8.9 mg/dL (ref 8.9–10.3)
Chloride: 101 mmol/L (ref 98–111)
Creatinine, Ser: 0.68 mg/dL (ref 0.44–1.00)
GFR calc Af Amer: >60 mL/min (ref >60)
GFR calc non Af Amer: >60 mL/min (ref >60)
Glucose, Bld: 83 mg/dL (ref 70–99)
Potassium: 3.8 mmol/L (ref 3.5–5.1)
Sodium: 138 mmol/L (ref 135–145)
Total Bilirubin: 0.3 mg/dL (ref 0.3–1.2)
Total Protein: 6.9 g/dL (ref 6.5–8.1)

## 2017-12-27 LAB — CBC
HCT: 39.4 % (ref 36.0–46.0)
Hemoglobin: 12.8 g/dL (ref 12.0–15.0)
MCH: 30.3 pg (ref 26.0–34.0)
MCHC: 32.5 g/dL (ref 30.0–36.0)
MCV: 93.1 fL (ref 80.0–100.0)
Platelets: 294 10*3/uL (ref 150–400)
RBC: 4.23 MIL/uL (ref 3.87–5.11)
RDW: 11.6 % (ref 11.5–15.5)
WBC: 7.5 10*3/uL (ref 4.0–10.5)
nRBC: 0 % (ref 0.0–0.2)

## 2017-12-27 LAB — LIPASE, BLOOD: Lipase: 30 U/L (ref 11–51)

## 2017-12-27 LAB — URINALYSIS, ROUTINE W REFLEX MICROSCOPIC
Bilirubin Urine: NEGATIVE
Glucose, UA: NEGATIVE mg/dL
Ketones, ur: NEGATIVE mg/dL
Nitrite: NEGATIVE
Protein, ur: NEGATIVE mg/dL
Specific Gravity, Urine: 1.001 — ABNORMAL LOW (ref 1.005–1.030)
pH: 6 (ref 5.0–8.0)

## 2017-12-27 MED ORDER — LOPERAMIDE HCL 2 MG PO CAPS: 2.0000 mg | ORAL_CAPSULE | Freq: Two times a day (BID) | ORAL | 0 refills | Status: AC

## 2017-12-27 MED ORDER — SODIUM CHLORIDE 0.9 % IV BOLUS
1000.0000 mL | Freq: Once | INTRAVENOUS | Status: AC
  Administered 2017-12-27: 1000 mL via INTRAVENOUS

## 2017-12-27 NOTE — Discharge Instructions (Signed)
Monitor your condition carefully, stay well-hydrated and do not hesitate to return here for concerning changes.

## 2017-12-27 NOTE — ED Triage Notes (Signed)
Patient arrives with c/o nausea and diarrhea that started on Sunday. Patient states symptoms subsided and returned on Tuesday. Patient reports abdominal pain umbilical area. Patient given acid reflux meds and bentyl on Monday. Denies blood in stool or dysuria.

## 2017-12-27 NOTE — ED Provider Notes (Signed)
[facility]-EMERGENCY DEPT Provider Note   CSN: [id number] Arrival date & time: 12/27/17  1105     History   Chief Complaint Chief Complaint  Patient presents with  . Nausea  . Diarrhea  . Abdominal Pain    HPI Wendy Jensen is a 57 y.o. female.  HPI Presents with concern of weakness, nausea, anorexia, diarrhea. Onset was about 5 days ago, patient was transiently better after taking OTC antidiarrheal medication. However, over the past day the patient has had recurrence of multiple episodes of loose stool, worsening weakness, persistent anorexia, without fever, without abdominal pain. She states that she is generally well, was well prior to the onset of symptoms.  When she did eat 1 memorable meal, but no other individuals at that experience experienced similar illness.  Past Medical History:  Diagnosis Date  . GERD (gastroesophageal reflux disease)   . Hypertension   . Reflux     Patient Active Problem List   Diagnosis Date Noted  . ANEMIA, IRON DEFICIENCY 09/10/2008  . HYPERTENSION 09/10/2008  . GERD 09/10/2008    Past Surgical History:  Procedure Laterality Date  . ESOPHAGEAL DILATION    . TUBAL LIGATION       OB History   None      Home Medications    Prior to Admission medications   Medication Sig Start Date End Date Taking? Authorizing Provider  ALPRAZolam Prudy Feeler) 0.5 MG tablet Take 0.5 mg by mouth 2 (two) times daily as needed for anxiety.  12/11/17  Yes [provider]  clindamycin (CLEOCIN) 300 MG capsule Take 300 mg by mouth 4 (four) times daily.   Yes [provider]  dicyclomine (BENTYL) 10 MG capsule Take 10 mg by mouth 3 (three) times daily. 12/24/17  Yes [provider]  hydrochlorothiazide (HYDRODIURIL) 25 MG tablet Take 25 mg by mouth daily. 12/04/17  Yes [provider]  lisinopril (PRINIVIL,ZESTRIL) 30 MG tablet Take 30 mg by mouth daily. 12/04/17  Yes [provider]    loperamide (IMODIUM) 2 MG capsule Take 2 mg by mouth every 4 (four) hours as needed for diarrhea or loose stools.   Yes [provider]  omeprazole (PRILOSEC) 40 MG capsule Take 40 mg by mouth daily.   Yes [provider]  potassium chloride (K-DUR) 10 MEQ tablet Take 10 mEq by mouth daily. 10/24/17  Yes [provider]  atorvastatin (LIPITOR) 20 MG tablet Take 20 mg by mouth daily. 12/10/17   [provider]  methocarbamol (ROBAXIN) 500 MG tablet Take 1 tablet (500 mg total) by mouth 4 (four) times daily. Patient not taking: Reported on 12/01/2017 07/30/14   Renne Crigler, PA-C  penicillin v potassium (VEETID) 500 MG tablet Take 1 tablet (500 mg total) by mouth 4 (four) times daily. Patient not taking: Reported on 12/01/2017 10/05/15   Roxy Horseman, PA-C    Family History Family History  Problem Relation Age of Onset  . Hypertension Father     Social History Social History   Tobacco Use  . Smoking status: Current Every Day Smoker    Packs/day: 0.50    Types: Cigarettes  . Smokeless tobacco: Never Used  Substance Use Topics  . Alcohol use: Yes    Comment: 1 glass beer per day  . Drug use: No     Allergies   Patient has no known allergies.   Review of Systems Review of Systems  Constitutional:       Per HPI, otherwise negative  HENT:       Per HPI, otherwise negative  Respiratory:       Per HPI, otherwise negative  Cardiovascular:       Per HPI, otherwise negative  Gastrointestinal: Positive for diarrhea, nausea and vomiting. Negative for abdominal pain.  Endocrine:       Negative aside from HPI  Genitourinary:       Neg aside from HPI   Musculoskeletal:       Per HPI, otherwise negative  Skin: Negative.   Neurological: Negative for syncope.     Physical Exam Updated Vital Signs BP (!) 146/85   Pulse 66   Temp 99 F (37.2 C) (Oral)   Resp 18   Ht 5\' 4"  (1.626 m)   Wt 71.7 kg   SpO2 100%   BMI 27.12 kg/m    Physical Exam  Constitutional: She is oriented to person, place, and time. She appears well-developed and well-nourished. No distress.  HENT:  Head: Normocephalic and atraumatic.  Eyes: Conjunctivae and EOM are normal.  Cardiovascular: Normal rate and regular rhythm.  Pulmonary/Chest: Effort normal and breath sounds normal. No stridor. No respiratory distress.  Abdominal: She exhibits no distension. There is no tenderness. There is no rigidity and no guarding.  Musculoskeletal: She exhibits no edema.  Neurological: She is alert and oriented to person, place, and time. No cranial nerve deficit.  Skin: Skin is warm and dry.  Psychiatric: She has a normal mood and affect.  Nursing note and vitals reviewed.    ED Treatments / Results  Labs (all labs ordered are listed, but only abnormal results are displayed) Labs Reviewed  COMPREHENSIVE METABOLIC PANEL - Abnormal; Notable for the following components:      Result Value   Albumin 3.4 (*)    All other components within normal limits  URINALYSIS, ROUTINE W REFLEX MICROSCOPIC - Abnormal; Notable for the following components:   Color, Urine STRAW (*)    Specific Gravity, Urine 1.001 (*)    Hgb urine dipstick SMALL (*)    Leukocytes, UA SMALL (*)    Bacteria, UA RARE (*)    All other components within normal limits  LIPASE, BLOOD  CBC    Procedures Procedures (including critical care time)  Medications Ordered in ED Medications  sodium chloride 0.9 % bolus 1,000 mL (has no administration in time range)     Initial Impression / Assessment and Plan / ED Course  I have reviewed the triage vital signs and the nursing notes.  Pertinent labs & imaging results that were available during my care of the patient were reviewed by me and considered in my medical decision making (see chart for details).  Generally well patient presents with ongoing episodic diarrhea, weakness, without abdominal pain. Patient is afebrile, awake, alert,  with normal lab panel, no leukocytosis, and absent abdominal pain, no indication for imaging. Patient may have food reaction, but given her reassuring findings, patient is appropriate for fluid resuscitation, antidiarrheal, following up with primary care.   Final Clinical Impressions(s) / ED Diagnoses  Nausea, vomiting, and diarrhea.   Gerhard Munch, MD 12/27/17 [phone]

## 2017-12-27 NOTE — ED Notes (Signed)
Pt is alert and orinted x 4 and is verbally responsive. Pt reports 6/10 generalized abdominal pain cramping. Pt reports that symptoms started Sundays went away and came back pt reports that shebhas had associated nausea and diarrhea.

## 2018-04-11 ENCOUNTER — Encounter (HOSPITAL_COMMUNITY): Payer: Self-pay

## 2018-04-11 ENCOUNTER — Other Ambulatory Visit: Payer: Self-pay

## 2018-04-11 ENCOUNTER — Emergency Department (HOSPITAL_COMMUNITY)
Admission: EM | Admit: 2018-04-11 | Discharge: 2018-04-11 | Disposition: A | Discharge status: Home / Self Care | Payer: BLUE CROSS/BLUE SHIELD | Attending: Emergency Medicine | Admitting: Emergency Medicine

## 2018-04-11 DIAGNOSIS — F17210 Nicotine dependence, cigarettes, uncomplicated: Secondary | ICD-10-CM | POA: Diagnosis not present

## 2018-04-11 DIAGNOSIS — I160 Hypertensive urgency: Secondary | ICD-10-CM | POA: Diagnosis not present

## 2018-04-11 DIAGNOSIS — R42 Dizziness and giddiness: Secondary | ICD-10-CM | POA: Diagnosis present

## 2018-04-11 DIAGNOSIS — Z79899 Other long term (current) drug therapy: Secondary | ICD-10-CM | POA: Insufficient documentation

## 2018-04-11 HISTORY — DX: Peptic ulcer, site unspecified, unspecified as acute or chronic, without hemorrhage or perforation: K27.9

## 2018-04-11 LAB — CBC
HCT: 41.7 % (ref 36.0–46.0)
Hemoglobin: 12.9 g/dL (ref 12.0–15.0)
MCH: 29.9 pg (ref 26.0–34.0)
MCHC: 30.9 g/dL (ref 30.0–36.0)
MCV: 96.8 fL (ref 80.0–100.0)
Platelets: 262 10*3/uL (ref 150–400)
RBC: 4.31 MIL/uL (ref 3.87–5.11)
RDW: 12.4 % (ref 11.5–15.5)
WBC: 6.2 10*3/uL (ref 4.0–10.5)
nRBC: 0 % (ref 0.0–0.2)

## 2018-04-11 LAB — BASIC METABOLIC PANEL
Anion gap: 5 (ref 5–15)
BUN: 21 mg/dL — ABNORMAL HIGH (ref 6–20)
CO2: 26 mmol/L (ref 22–32)
Calcium: 8.9 mg/dL (ref 8.9–10.3)
Chloride: 105 mmol/L (ref 98–111)
Creatinine, Ser: 0.7 mg/dL (ref 0.44–1.00)
GFR calc Af Amer: >60 mL/min (ref >60)
GFR calc non Af Amer: >60 mL/min (ref >60)
Glucose, Bld: 176 mg/dL — ABNORMAL HIGH (ref 70–99)
Potassium: 4.8 mmol/L (ref 3.5–5.1)
Sodium: 136 mmol/L (ref 135–145)

## 2018-04-11 MED ORDER — HYDRALAZINE HCL 10 MG PO TABS: 10.0000 mg | ORAL_TABLET | Freq: Three times a day (TID) | ORAL | 0 refills | Status: DC

## 2018-04-11 NOTE — ED Triage Notes (Signed)
Patient states she has been compliant with her BP meds. Patient c/o a throbbing headache and pressure behind her eyes. BP in triage 190/103.

## 2018-04-11 NOTE — ED Notes (Signed)
Verbalized understanding discharge instructions, prescriptions, and follow-up. In no acute distress.   

## 2018-04-11 NOTE — ED Provider Notes (Signed)
[facility]-EMERGENCY DEPT Provider Note   CSN: [id number] Arrival date & time: 04/11/18  4268     History   Chief Complaint Chief Complaint  Patient presents with  . Hypertension    HPI Wendy Jensen is a 58 y.o. female.  HPI Presents with concern of lightheadedness, hypertension. Patient has a history of hypertension, refractory to multiple different medications for control.  Patient most recently has been taking what sounds like amlodipine, 50 mg, twice daily, in spite of this she has persistent lightheadedness, without syncope, without vision changes, without vomiting, nausea, anorexia, diarrhea, pain.  Notably, the patient accompanied to her fianc here, for evaluation of near syncope, and while here, she decided to have evaluation.  Past Medical History:  Diagnosis Date  . GERD (gastroesophageal reflux disease)   . Hypertension   . Peptic ulcer   . Reflux     Patient Active Problem List   Diagnosis Date Noted  . ANEMIA, IRON DEFICIENCY 09/10/2008  . HYPERTENSION 09/10/2008  . GERD 09/10/2008    Past Surgical History:  Procedure Laterality Date  . COLONOSCOPY    . ESOPHAGEAL DILATION    . TUBAL LIGATION    . UPPER GI ENDOSCOPY       OB History   No obstetric history on file.      Home Medications    Prior to Admission medications   Medication Sig Start Date End Date Taking? Authorizing Provider  ALPRAZolam Prudy Feeler) 0.5 MG tablet Take 0.5 mg by mouth 2 (two) times daily as needed for anxiety.  12/11/17  Yes [provider]  losartan (COZAAR) 100 MG tablet Take 100 mg by mouth daily. 03/15/18  Yes [provider]  potassium chloride (K-DUR) 10 MEQ tablet Take 10 mEq by mouth 3 (three) times a week.  10/24/17  Yes [provider]  methocarbamol (ROBAXIN) 500 MG tablet Take 1 tablet (500 mg total) by mouth 4 (four) times daily. Patient not taking: Reported on 12/01/2017 07/30/14   Renne Crigler, PA-C    penicillin v potassium (VEETID) 500 MG tablet Take 1 tablet (500 mg total) by mouth 4 (four) times daily. Patient not taking: Reported on 12/01/2017 10/05/15   Roxy Horseman, PA-C    Family History Family History  Problem Relation Age of Onset  . Hypertension Father     Social History Social History   Tobacco Use  . Smoking status: Current Every Day Smoker    Packs/day: 0.15    Types: Cigarettes  . Smokeless tobacco: Never Used  Substance Use Topics  . Alcohol use: Yes    Comment: 1 glass beer per day  . Drug use: No     Allergies   Patient has no known allergies.   Review of Systems Review of Systems  Constitutional:       Per HPI, otherwise negative  HENT:       Per HPI, otherwise negative  Respiratory:       Per HPI, otherwise negative  Cardiovascular:       Per HPI, otherwise negative  Gastrointestinal: Negative for vomiting.  Endocrine:       Negative aside from HPI  Genitourinary:       Neg aside from HPI   Musculoskeletal:       Per HPI, otherwise negative  Skin: Negative.   Neurological: Negative for syncope.     Physical Exam Updated Vital Signs BP (!) 166/95 (BP Location: Left Arm)   Pulse 63   Temp 98.7  F (37.1 C) (Oral)   Resp 16   Ht 5\' 4"  (1.626 m)   Wt 74.4 kg   SpO2 97%   BMI 28.15 kg/m   Physical Exam Vitals signs and nursing note reviewed.  Constitutional:      General: She is not in acute distress.    Appearance: She is well-developed.  HENT:     Head: Normocephalic and atraumatic.  Eyes:     Conjunctiva/sclera: Conjunctivae normal.  Cardiovascular:     Rate and Rhythm: Normal rate and regular rhythm.  Pulmonary:     Effort: Pulmonary effort is normal. No respiratory distress.     Breath sounds: Normal breath sounds. No stridor.  Abdominal:     General: There is no distension.  Skin:    General: Skin is warm and dry.  Neurological:     Mental Status: She is alert and oriented to person, place, and time.      Cranial Nerves: No cranial nerve deficit.      ED Treatments / Results  Labs (all labs ordered are listed, but only abnormal results are displayed) Labs Reviewed  BASIC METABOLIC PANEL - Abnormal; Notable for the following components:      Result Value   Glucose, Bld 176 (*)    BUN 21 (*)    All other components within normal limits  CBC    EKG None  Radiology No results found.  Procedures Procedures (including critical care time)  Medications Ordered in ED Medications - No data to display   Initial Impression / Assessment and Plan / ED Course  I have reviewed the triage vital signs and the nursing notes.  Pertinent labs & imaging results that were available during my care of the patient were reviewed by me and considered in my medical decision making (see chart for details).  Repeat exam the patient is in no distress. Vital signs remain essentially the same, though she does have mild hypertension, she has no evidence for endorgan damage, or complication. She has had no new development of new weakness, or other focal neurologic complaints. Given the patient's acknowledgment of using multiple prior medications for blood pressure control, but single agent use currently, the patient was start a second medication, will follow-up with primary care.  Final Clinical Impressions(s) / ED Diagnoses  Hypertensive urgency   Gerhard Munch, MD 04/11/18 1425

## 2018-04-11 NOTE — Discharge Instructions (Signed)
As discussed, today's evaluation has been generally reassuring. However, with your blood pressure issues and the new medication that you are initiating, it is important that you follow-up with a physician.  If you elect to see a different individual, you may consider the office listed above.  Return here for concerning changes in your condition.

## 2018-04-18 ENCOUNTER — Other Ambulatory Visit: Payer: Self-pay | Admitting: Internal Medicine

## 2018-04-18 DIAGNOSIS — I160 Hypertensive urgency: Secondary | ICD-10-CM

## 2018-04-30 DIAGNOSIS — F17210 Nicotine dependence, cigarettes, uncomplicated: Secondary | ICD-10-CM | POA: Insufficient documentation

## 2018-04-30 DIAGNOSIS — L720 Epidermal cyst: Secondary | ICD-10-CM | POA: Insufficient documentation

## 2018-05-06 DIAGNOSIS — E7800 Pure hypercholesterolemia, unspecified: Secondary | ICD-10-CM | POA: Insufficient documentation

## 2018-05-20 DIAGNOSIS — H1132 Conjunctival hemorrhage, left eye: Secondary | ICD-10-CM | POA: Insufficient documentation

## 2018-09-13 DIAGNOSIS — L600 Ingrowing nail: Secondary | ICD-10-CM | POA: Insufficient documentation

## 2018-09-13 DIAGNOSIS — M546 Pain in thoracic spine: Secondary | ICD-10-CM | POA: Insufficient documentation

## 2018-11-13 ENCOUNTER — Other Ambulatory Visit: Payer: Self-pay

## 2018-11-13 DIAGNOSIS — Z20822 Contact with and (suspected) exposure to covid-19: Secondary | ICD-10-CM

## 2018-11-14 LAB — NOVEL CORONAVIRUS, NAA: SARS-CoV-2, NAA: NOT DETECTED

## 2019-04-17 ENCOUNTER — Other Ambulatory Visit: Payer: Self-pay | Admitting: Gastroenterology

## 2019-04-17 DIAGNOSIS — R140 Abdominal distension (gaseous): Secondary | ICD-10-CM

## 2019-04-23 ENCOUNTER — Ambulatory Visit
Admission: RE | Admit: 2019-04-23 | Discharge: 2019-04-23 | Disposition: A | Discharge status: Home / Self Care | Payer: BLUE CROSS/BLUE SHIELD | Source: Ambulatory Visit | Attending: Gastroenterology | Admitting: Gastroenterology

## 2019-04-23 DIAGNOSIS — R140 Abdominal distension (gaseous): Secondary | ICD-10-CM

## 2019-06-23 ENCOUNTER — Telehealth: Payer: Self-pay

## 2019-06-23 NOTE — Telephone Encounter (Signed)

## 2019-06-23 NOTE — Patient Instructions (Addendum)
I would ask that since you are not taking medications for your blood pressure that you monitor at home. Please take your blood pressure at home in the morning prior to drinking any caffeine. Sit with both feet flat on the floor. Rest for at least 2 minutes prior to checking your blood pressure to allow it to normalize after any movement. Write this number down on your log and please bring your log to every office visit. Call the clinic if you have numbers consistently greater than 150 on the top or 100 on the bottom.     Thank you for choosing Primary Care at [facility] to be your medical home!    Ciani Stamps was seen by De Hollingshead, DO today.   Arella Curnow's primary care provider is Marcy Siren, DO.   For the best care possible, you should try to see Marcy Siren, DO whenever you come to the clinic.   We look forward to seeing you again soon!  If you have any questions about your visit today, please call us at [phone] or feel free to reach your primary care provider via MyChart.

## 2019-06-24 ENCOUNTER — Encounter: Payer: Self-pay | Admitting: Internal Medicine

## 2019-06-24 ENCOUNTER — Ambulatory Visit (INDEPENDENT_AMBULATORY_CARE_PROVIDER_SITE_OTHER): Payer: BC Managed Care – PPO | Admitting: Internal Medicine

## 2019-06-24 ENCOUNTER — Ambulatory Visit (INDEPENDENT_AMBULATORY_CARE_PROVIDER_SITE_OTHER): Payer: BC Managed Care – PPO

## 2019-06-24 ENCOUNTER — Other Ambulatory Visit: Payer: Self-pay

## 2019-06-24 VITALS — BP 151/87 | HR 67 | Temp 97.2°F | Resp 17 | Ht 65.0 in | Wt 165.0 lb

## 2019-06-24 DIAGNOSIS — M549 Dorsalgia, unspecified: Secondary | ICD-10-CM

## 2019-06-24 DIAGNOSIS — G8929 Other chronic pain: Secondary | ICD-10-CM

## 2019-06-24 DIAGNOSIS — Z1231 Encounter for screening mammogram for malignant neoplasm of breast: Secondary | ICD-10-CM

## 2019-06-24 DIAGNOSIS — Z114 Encounter for screening for human immunodeficiency virus [HIV]: Secondary | ICD-10-CM

## 2019-06-24 DIAGNOSIS — M542 Cervicalgia: Secondary | ICD-10-CM

## 2019-06-24 DIAGNOSIS — E7800 Pure hypercholesterolemia, unspecified: Secondary | ICD-10-CM

## 2019-06-24 DIAGNOSIS — I10 Essential (primary) hypertension: Secondary | ICD-10-CM | POA: Diagnosis not present

## 2019-06-24 DIAGNOSIS — Z7689 Persons encountering health services in other specified circumstances: Secondary | ICD-10-CM | POA: Diagnosis not present

## 2019-06-24 DIAGNOSIS — Z1159 Encounter for screening for other viral diseases: Secondary | ICD-10-CM
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
## 2019-06-25 ENCOUNTER — Other Ambulatory Visit: Payer: Self-pay | Admitting: Internal Medicine

## 2019-06-25 ENCOUNTER — Encounter: Payer: Self-pay | Admitting: Internal Medicine

## 2019-06-25 DIAGNOSIS — M47813 Spondylosis without myelopathy or radiculopathy, cervicothoracic region: Secondary | ICD-10-CM | POA: Insufficient documentation

## 2019-06-25 DIAGNOSIS — I700 Atherosclerosis of aorta: Secondary | ICD-10-CM | POA: Insufficient documentation

## 2019-06-25 LAB — CBC WITH DIFFERENTIAL/PLATELET
Basophils Absolute: 0 10*3/uL (ref 0.0–0.2)
Basos: 1 %
EOS (ABSOLUTE): 0.2 10*3/uL (ref 0.0–0.4)
Eos: 3 %
Hematocrit: 43.3 % (ref 34.0–46.6)
Hemoglobin: 14.4 g/dL (ref 11.1–15.9)
Immature Grans (Abs): 0 10*3/uL (ref 0.0–0.1)
Immature Granulocytes: 0 %
Lymphocytes Absolute: 2.5 10*3/uL (ref 0.7–3.1)
Lymphs: 37 %
MCH: 30.3 pg (ref 26.6–33.0)
MCHC: 33.3 g/dL (ref 31.5–35.7)
MCV: 91 fL (ref 79–97)
Monocytes Absolute: 0.4 10*3/uL (ref 0.1–0.9)
Monocytes: 6 %
Neutrophils Absolute: 3.6 10*3/uL (ref 1.4–7.0)
Neutrophils: 53 %
Platelets: 300 10*3/uL (ref 150–450)
RBC: 4.75 x10E6/uL (ref 3.77–5.28)
RDW: 12.7 % (ref 11.7–15.4)
WBC: 6.8 10*3/uL (ref 3.4–10.8)

## 2019-06-25 LAB — COMPREHENSIVE METABOLIC PANEL
ALT: 15 IU/L (ref 0–32)
AST: 26 IU/L (ref 0–40)
Albumin/Globulin Ratio: 1.6 (ref 1.2–2.2)
Albumin: 4.6 g/dL (ref 3.8–4.9)
Alkaline Phosphatase: 110 IU/L (ref 39–117)
BUN/Creatinine Ratio: 18 (ref 9–23)
BUN: 18 mg/dL (ref 6–24)
Bilirubin Total: 0.5 mg/dL (ref 0.0–1.2)
CO2: 22 mmol/L (ref 20–29)
Calcium: 9.6 mg/dL (ref 8.7–10.2)
Chloride: 104 mmol/L (ref 96–106)
Creatinine, Ser: 1 mg/dL (ref 0.57–1.00)
GFR calc Af Amer: 72 mL/min/{1.73_m2} (ref >59)
GFR calc non Af Amer: 63 mL/min/{1.73_m2} (ref >59)
Globulin, Total: 2.9 g/dL (ref 1.5–4.5)
Glucose: 94 mg/dL (ref 65–99)
Potassium: 4.9 mmol/L (ref 3.5–5.2)
Sodium: 142 mmol/L (ref 134–144)
Total Protein: 7.5 g/dL (ref 6.0–8.5)

## 2019-06-25 LAB — LDL CHOLESTEROL, DIRECT: LDL Direct: 179 mg/dL — ABNORMAL HIGH (ref 0–99)

## 2019-06-25 LAB — HEPATITIS C ANTIBODY: Hep C Virus Ab: <0.1 s/co ratio (ref 0.0–0.9)

## 2019-06-25 LAB — HIV ANTIBODY (ROUTINE TESTING W REFLEX): HIV Screen 4th Generation wRfx: NONREACTIVE

## 2019-06-25 MED ORDER — ROSUVASTATIN CALCIUM 20 MG PO TABS: 20.0000 mg | ORAL_TABLET | Freq: Every day | ORAL | 0 refills | Status: DC

## 2019-06-26 ENCOUNTER — Ambulatory Visit: Payer: BC Managed Care – PPO

## 2019-06-27 ENCOUNTER — Ambulatory Visit
Admission: RE | Admit: 2019-06-27 | Discharge: 2019-06-27 | Disposition: A | Discharge status: Home / Self Care | Payer: BC Managed Care – PPO | Source: Ambulatory Visit | Attending: Internal Medicine | Admitting: Internal Medicine

## 2019-06-27 ENCOUNTER — Other Ambulatory Visit: Payer: Self-pay

## 2019-06-27 DIAGNOSIS — Z1231 Encounter for screening mammogram for malignant neoplasm of breast: Secondary | ICD-10-CM

## 2019-06-27 NOTE — Progress Notes (Signed)
Patient notified of results & recommendations. Expressed understanding.

## 2019-09-24 ENCOUNTER — Telehealth: Payer: Self-pay

## 2019-09-24 NOTE — Telephone Encounter (Signed)

## 2019-09-25 ENCOUNTER — Telehealth (INDEPENDENT_AMBULATORY_CARE_PROVIDER_SITE_OTHER): Payer: BC Managed Care – PPO | Admitting: Internal Medicine

## 2019-09-25 ENCOUNTER — Encounter: Payer: Self-pay | Admitting: Internal Medicine

## 2019-09-25 VITALS — BP 136/80 | HR 70

## 2019-09-25 DIAGNOSIS — M542 Cervicalgia: Secondary | ICD-10-CM

## 2019-09-25 DIAGNOSIS — E7800 Pure hypercholesterolemia, unspecified: Secondary | ICD-10-CM

## 2019-09-25 DIAGNOSIS — I10 Essential (primary) hypertension: Secondary | ICD-10-CM | POA: Diagnosis not present

## 2019-09-25 DIAGNOSIS — M549 Dorsalgia, unspecified: Secondary | ICD-10-CM

## 2019-09-25 DIAGNOSIS — G8929 Other chronic pain: Secondary | ICD-10-CM

## 2019-09-25 MED ORDER — ROSUVASTATIN CALCIUM 20 MG PO TABS: 20.0000 mg | ORAL_TABLET | Freq: Every day | ORAL | 0 refills | Status: DC

## 2019-09-25 NOTE — Progress Notes (Signed)
Virtual Visit via Telephone Note  I connected with Wendy Jensen, on 09/25/2019 at 2:27 PM by telephone due to the COVID-19 pandemic and verified that I am speaking with the correct person using two identifiers.   Consent: I discussed the limitations, risks, security and privacy concerns of performing an evaluation and management service by telephone and the availability of in person appointments. I also discussed with the patient that there may be a patient responsible charge related to this service. The patient expressed understanding and agreed to proceed.   Location of Patient: Home   Location of Provider: Clinic    Persons participating in Telemedicine visit: Wendy Jensen Dr. Earlene Plater    History of Present Illness: Patient has a visit to follow up on HTN. She was seen on 4/13 and reported at that time she had discontinued Amlodipine and Hydralazine due to side effects. She wanted to continue to monitor her BP before trying any other medications. She reports home BPs 130/70-80s.    Past Medical History:  Diagnosis Date  . GERD (gastroesophageal reflux disease)   . Hypertension   . Peptic ulcer   . Reflux    Allergies  Allergen Reactions  . Hydralazine Other (See Comments)  . Hydrochlorothiazide Other (See Comments)  . Losartan Other (See Comments)    Current Outpatient Medications on File Prior to Visit  Medication Sig Dispense Refill  . rosuvastatin (CRESTOR) 20 MG tablet Take 1 tablet (20 mg total) by mouth daily. 90 tablet 0   No current facility-administered medications on file prior to visit.    Observations/Objective: NAD. Speaking clearly.  Work of breathing normal.  Alert and oriented. Mood appropriate.   Assessment and Plan: 1. Essential hypertension BP fairly well controlled with home monitoring. Not currently on medications. Asymptomatic. Will continue to monitor.   2. Pure hypercholesterolemia LDL was increased at  last visit and Crestor was increased to 20 mg. She has not yet started the new dose. Will give Rx and plan to monitor LDL.  - rosuvastatin (CRESTOR) 20 MG tablet; Take 1 tablet (20 mg total) by mouth daily.  Dispense: 90 tablet; Refill: 0  3. Chronic neck and back pain Mild degenerative changes in cervical and thoracic spine noted on April 2021. Will refer to PT.  - Ambulatory referral to Physical Therapy   Follow Up Instructions: 3 month f/u    I discussed the assessment and treatment plan with the patient. The patient was provided an opportunity to ask questions and all were answered. The patient agreed with the plan and demonstrated an understanding of the instructions.   The patient was advised to call back or seek an in-person evaluation if the symptoms worsen or if the condition fails to improve as anticipated.     I provided 14 minutes total of non-face-to-face time during this encounter including median intraservice time, reviewing previous notes, investigations, ordering medications, medical decision making, coordinating care and patient verbalized understanding at the end of the visit.    Marcy Siren, D.O. Primary Care at [facility]  09/25/2019, 2:27 PM

## 2019-10-20 ENCOUNTER — Ambulatory Visit: Payer: BC Managed Care – PPO | Attending: Internal Medicine | Admitting: Physical Therapy

## 2019-10-20 ENCOUNTER — Other Ambulatory Visit: Payer: Self-pay

## 2019-10-20 ENCOUNTER — Encounter: Payer: Self-pay | Admitting: Physical Therapy

## 2019-10-20 DIAGNOSIS — M546 Pain in thoracic spine: Secondary | ICD-10-CM | POA: Insufficient documentation

## 2019-10-20 DIAGNOSIS — M542 Cervicalgia: Secondary | ICD-10-CM | POA: Diagnosis present

## 2019-10-20 DIAGNOSIS — G8929 Other chronic pain: Secondary | ICD-10-CM | POA: Diagnosis present

## 2019-10-20 DIAGNOSIS — M6281 Muscle weakness (generalized): Secondary | ICD-10-CM

## 2019-10-20 DIAGNOSIS — M545 Low back pain, unspecified: Secondary | ICD-10-CM

## 2019-10-20 NOTE — Patient Instructions (Signed)
Access Code: [email or web address] URL: [email or web address] Date: 10/20/2019 Prepared by: Rosana Hoes  Exercises Sidelying Thoracic Lumbar Rotation - 2 x daily - 7 x weekly - 10-15 reps - 5 seconds hold Supine Shoulder Horizontal Abduction with Resistance - 1 x daily - 7 x weekly - 2 sets - 10 reps Shoulder External Rotation and Scapular Retraction with Resistance - 1 x daily - 7 x weekly - 2 sets - 10 reps Banded Row - 1 x daily - 7 x weekly - 2 sets - 10 reps Seated Cervical Sidebending Stretch - 1 x daily - 7 x weekly - 3 sets - 10 reps Gentle Levator Scapulae Stretch - 2 x daily - 7 x weekly - 2 reps - 30 seconds hold

## 2019-10-21 NOTE — Therapy (Signed)
[facility] Outpatient Rehabilitation [facility] [address] [place], [place], [address] Phone: [phone]   Fax:  [phone]  Physical Therapy Evaluation  Patient Details  Name: Wendy Jensen MRN: [id number] Date of Birth: Jun 16, 1960 Referring Provider (PT): Arvilla Market, DO   Encounter Date: 10/20/2019   PT End of Session - 10/20/19 1608    Visit Number 1    Number of Visits 8    Date for PT Re-Evaluation 12/15/19    Authorization Type BCBS    PT Start Time 1610    PT Stop Time 1655    PT Time Calculation (min) 45 min    Activity Tolerance Patient tolerated treatment well    Behavior During Therapy [facility] for tasks assessed/performed           Past Medical History:  Diagnosis Date  . GERD (gastroesophageal reflux disease)   . Hypertension   . Peptic ulcer   . Reflux     Past Surgical History:  Procedure Laterality Date  . COLONOSCOPY    . ESOPHAGEAL DILATION    . TUBAL LIGATION    . UPPER GI ENDOSCOPY      There were no vitals filed for this visit.    Subjective Assessment - 10/20/19 1612    Subjective Patient reports back pain that has been going on for over a year. Her pain is located to lower neck and upper back, but she will also have some lower back pain and has experienced some left shoulder pain. She had an x-ray that showed she had arthritis for neck and upper back per patient. States that she has stiffness in the morning and it takes her a while to get moving. She has been out from work for the past 2 months due to summer break and doesn't want any complications when she returns to work and has to begin lifting again. She denies and numbness or tingling, pain referred past the shoulder, or other neurologic signs/symptoms.    Pertinent History Anxiety    Limitations Sitting;Lifting;Standing;Walking;House hold activities    How long can you sit comfortably? 1 hour    How long can you stand comfortably? 30 minutes    How  long can you walk comfortably? 30 minutes    Diagnostic tests X-ray cervical and thoracic    Patient Stated Goals Patient "wants life back," she wants to be able to lift and keep working without limitation    Currently in Pain? Yes    Pain Score 3     Pain Location Back    Pain Orientation Upper    Pain Descriptors / Indicators Spasm    Pain Type Chronic pain    Pain Radiating Towards Lower cervical and thoracic region    Pain Onset More than a month ago    Pain Frequency Intermittent    Aggravating Factors  Pain can come on randomly, no specific agg factor, sometimes lifting can bring on pain    Pain Relieving Factors Hot shower, stretching    Effect of Pain on Daily Activities Patient feels like she will be limited and have more pain when she returns to work, she has been avoiding lifting              [facility] PT Assessment - 10/21/19 0001      Assessment   Medical Diagnosis Chronic neck and back pain    Referring Provider (PT) Arvilla Market, DO    Onset Date/Surgical Date --   > 1 year  Hand Dominance Left    Next MD Visit Not scheduled    Prior Therapy None      Precautions   Precautions None      Restrictions   Weight Bearing Restrictions No      Balance Screen   Has the patient fallen in the past 6 months No    Has the patient had a decrease in activity level because of a fear of falling?  No    Is the patient reluctant to leave their home because of a fear of falling?  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, has to do a lot of lifting and work requires her to lift >75#    Leisure Playing with grandbabies, walking       Cognition   Overall Cognitive Status Within Functional Limits for tasks assessed      Observation/Other Assessments   Observations Patient appears in no apparent distress    Focus on Therapeutic Outcomes (FOTO)  Lumbar: 40% limitation; Neck: 34% limitation      Sensation   Light Touch Appears Intact      Coordination   Gross Motor Movements are Fluid and Coordinated Yes      Posture/Postural Control   Posture Comments Patient exhibits rounded shoulder and forward head posture      ROM / Strength   AROM / PROM / Strength AROM;Strength      AROM   Overall AROM Comments Shoulder AROM grossly WFL and non-painful, thoracic rotation limited bilaterally with patient reporting tightness    AROM Assessment Site Cervical    Cervical Flexion 60    Cervical Extension 40    Cervical - Right Side Bend 30    Cervical - Left Side Bend 25    Cervical - Right Rotation 60    Cervical - Left Rotation 60      Strength   Overall Strength Comments Periscapular strength grossly 4-/5 MMT bilaterally    Strength Assessment Site Shoulder    Right/Left Shoulder Right;Left    Right Shoulder Flexion 4+/5    Right Shoulder Extension 4/5    Right Shoulder ABduction 4+/5    Right Shoulder External Rotation 4/5    Right Shoulder Horizontal ABduction 4/5    Left Shoulder Flexion 4/5    Left Shoulder Extension 4/5    Left Shoulder ABduction 4/5    Left Shoulder External Rotation 4/5    Left Shoulder Horizontal ABduction 4/5      Flexibility   Soft Tissue Assessment /Muscle Length --   Bilateral upper trap tightness     Palpation   Spinal mobility Not assessed    Palpation comment TTP bilateral upper trap region, rhomboid and mid trap region      Special Tests    Special Tests Cervical    Cervical Tests Spurling's      Spurling's   Findings Negative      Transfers   Transfers Independent with all Transfers  Objective measurements completed on  examination: See above findings.       [facility] Adult PT Treatment/Exercise - 10/21/19 0001      Exercises   Exercises Neck      Neck Exercises: Theraband   Rows 10 reps;Red    Shoulder External Rotation 10 reps    Shoulder External Rotation Limitations yellow    Horizontal ABduction 10 reps    Horizontal ABduction Limitations supine, yellow      Neck Exercises: Stretches   Upper Trapezius Stretch 30 seconds    Levator Stretch 30 seconds    Other Neck Stretches Sidelying thoracic rotation stretch x10 each                  PT Education - 10/20/19 1607    Education Details Exam findings, POC, HEP    Person(s) Educated Patient    Methods Explanation;Demonstration;Tactile cues;Verbal cues;Handout    Comprehension Verbalized understanding;Returned demonstration;Verbal cues required;Tactile cues required;Need further instruction            PT Short Term Goals - 10/20/19 1608      PT SHORT TERM GOAL #1   Title Patient will be I with initial HEP to progress with PT    Time 4    Period Weeks    Status New    Target Date 11/17/19      PT SHORT TERM GOAL #2   Title PT will review FOTO with patient and they will express understanding    Time 2    Period Weeks    Status New    Target Date 11/03/19      PT SHORT TERM GOAL #3   Title Patient will report 50% improvement in morning tightness to allow for improve mobility while going to work    Time 4    Period Weeks    Status New    Target Date 11/17/19             PT Long Term Goals - 10/20/19 1609      PT LONG TERM GOAL #1   Title Patient will be I with final HEP to maintain progress from PT    Time 8    Period Weeks    Status New    Target Date 12/15/19      PT LONG TERM GOAL #2   Title Patient will report improved functional level of </= 29% limitation for lumbar region and </= 27% limitation for neck region on FOTO    Time 8    Period Weeks    Status New    Target Date 12/15/19      PT LONG TERM  GOAL #3   Title Patient will exhibit periscapular and shoulder strength grossly >/= 4+/5 MMT to allow for lifting without limitation while at work    Time 8    Period Weeks    Status New    Target Date 12/15/19      PT LONG TERM GOAL #4   Title Patient will report no limitation with sitting or standing due to neck or upper back pain to allow improved ability to perform household tasks    Time 8    Period Weeks    Status New    Target Date 12/15/19                  Plan - 10/20/19 1608    Clinical Impression Statement Patient presents to PT with report of chronic lower cervical, thoracic region discomfort.  Patient also notes lower back and left shoulder pain on occasions. Patient exhibits postural deviations with upper trap and levator tightness, limited thoracic motion, and poor periscapular strength that are likely contributing to her pain. Todays sessions mainly focused on eval of neck and thoracic region but can further assess lower back pain in future sessions. She was provided with initial exercise program to address impairments noted. Patient would benefit from continued skilled PT to progress her postural control and strength to reduce pain of cervical and thoracic spine and allow patient to return to work and lifting activities without pain or limitation.    Personal Factors and Comorbidities Time since onset of injury/illness/exacerbation;Profession    Examination-Activity Limitations Sit;Stand;Lift;Carry    Examination-Participation Restrictions Occupation;Meal Prep;Cleaning;Shop;Driving    Stability/Clinical Decision Making Stable/Uncomplicated    Clinical Decision Making Low    Rehab Potential Good    PT Duration 8 weeks    PT Treatment/Interventions ADLs/Self Care Home Management;Cryotherapy;Electrical Stimulation;Iontophoresis 4mg /ml Dexamethasone;Moist Heat;Neuromuscular re-education;Therapeutic exercise;Therapeutic activities;Functional mobility  training;Patient/family education;Manual techniques;Dry needling;Passive range of motion;Taping;Spinal Manipulations;Joint Manipulations    PT Next Visit Plan Assess HEP and progress PRN, manual/dry needling for neck and thoracic region, progress postural strengthening, lifting mechanics    PT Home Exercise Plan [email or web address]    Consulted and Agree with Plan of Care Patient           Patient will benefit from skilled therapeutic intervention in order to improve the following deficits and impairments:  Postural dysfunction, Decreased strength, Pain, Decreased activity tolerance, Impaired flexibility, Improper body mechanics, Decreased range of motion  Visit Diagnosis: Cervicalgia  Pain in thoracic spine  Chronic bilateral low back pain without sciatica  Muscle weakness (generalized)     Problem List Patient Active Problem List   Diagnosis Date Noted  . Aortic calcification (HCC) 06/25/2019  . Osteoarthritis of cervicothoracic spine 06/25/2019  . Epidermoid cyst 04/30/2018  . Mixed hyperlipidemia 12/10/2017  . Allergic rhinitis 07/31/2017  . Generalized anxiety disorder 07/31/2017  . Nicotine dependence 07/31/2017  . Hypertension 09/10/2008  . GERD 09/10/2008    11/11/2008, PT, DPT, LAT, ATC 10/21/19  8:29 AM Phone: [phone] Fax: [phone]   [facility] Outpatient Rehabilitation [facility] [address] [place], [place], [place] Phone: [phone]   Fax:  [phone]  Name: Wendy Jensen MRN: Laurena Bering Date of Birth: Sep 19, 1960

## 2019-11-05 ENCOUNTER — Ambulatory Visit: Payer: BC Managed Care – PPO | Admitting: Physical Therapy

## 2019-11-05 ENCOUNTER — Telehealth: Payer: Self-pay | Admitting: Physical Therapy

## 2019-11-05 NOTE — Telephone Encounter (Signed)
Called pt in regards to missed 4:15pm appointment.  Pt states she's been having some family emergency. Pt apologetic about missing her appointment and states she will try and come on her next appointment. Pt reminded of her appointment next Wednesday on 9/1 at 4:15pm.   Kanchan Gal April Dell Ponto, PT, DPT

## 2019-11-07 DIAGNOSIS — Z0279 Encounter for issue of other medical certificate: Secondary | ICD-10-CM

## 2019-11-12 ENCOUNTER — Ambulatory Visit: Payer: BC Managed Care – PPO | Attending: Internal Medicine | Admitting: Physical Therapy

## 2019-11-12 ENCOUNTER — Telehealth: Payer: Self-pay | Admitting: Physical Therapy

## 2019-11-12 NOTE — Telephone Encounter (Signed)
Attempted to contact patient due to missed PT appointment. Left VM informing patient of missed appointment and reminding her of next scheduled appointment on 11/19/2019 at 4:15pm. Patient was also reminded of attendance policy, that she would only be allowed to schedule 1 visit at a time and that she would be discharged from PT if she missed her next appointment.   Rosana Hoes, PT, DPT, LAT, ATC 11/12/19  4:46 PM Phone: [phone] Fax: [phone]

## 2019-11-19 ENCOUNTER — Ambulatory Visit: Payer: BC Managed Care – PPO | Admitting: Physical Therapy

## 2019-11-19 ENCOUNTER — Telehealth: Payer: Self-pay | Admitting: Physical Therapy

## 2019-11-19 NOTE — Telephone Encounter (Signed)
Attempted to contact patient due to missed PT appointment. Left VM informing patient of missed appointment, and since this was her 3rd consecutive no-show missed PT appointment without prior contact she would be discharged from PT and would require a new PT referral from her provider in order to schedule further PT visit.   Rosana Hoes, PT, DPT, LAT, ATC 11/19/19  4:51 PM Phone: [phone] Fax: [phone]

## 2019-12-11 ENCOUNTER — Telehealth: Payer: Self-pay

## 2019-12-11 NOTE — Telephone Encounter (Signed)
Patient contacted office concerningprovider's recommendation for pain medications for tooth pain.

## 2019-12-12 NOTE — Telephone Encounter (Signed)
Please advise 

## 2019-12-15 NOTE — Telephone Encounter (Signed)
Can you clarify---is patient requesting pain medications for tooth?   Marcy Siren, D.O. Primary Care at [facility]  12/15/2019, 8:56 AM

## 2019-12-19 NOTE — Telephone Encounter (Signed)
Patient states that she took OTC meds for the tooth pain, which has resolved.

## 2020-01-15 ENCOUNTER — Encounter: Payer: Self-pay | Admitting: Internal Medicine

## 2020-01-15 ENCOUNTER — Other Ambulatory Visit: Payer: Self-pay

## 2020-01-15 ENCOUNTER — Ambulatory Visit (INDEPENDENT_AMBULATORY_CARE_PROVIDER_SITE_OTHER): Payer: BC Managed Care – PPO | Admitting: Internal Medicine

## 2020-01-15 VITALS — BP 180/105 | HR 84 | Temp 97.3°F | Resp 17 | Wt 156.0 lb

## 2020-01-15 DIAGNOSIS — E7800 Pure hypercholesterolemia, unspecified: Secondary | ICD-10-CM

## 2020-01-15 DIAGNOSIS — Z566 Other physical and mental strain related to work: Secondary | ICD-10-CM | POA: Diagnosis not present

## 2020-01-15 DIAGNOSIS — F4321 Adjustment disorder with depressed mood: Secondary | ICD-10-CM

## 2020-01-15 DIAGNOSIS — I10 Essential (primary) hypertension: Secondary | ICD-10-CM

## 2020-01-15 MED ORDER — CARVEDILOL 6.25 MG PO TABS: 6.2500 mg | ORAL_TABLET | Freq: Two times a day (BID) | ORAL | 3 refills | Status: DC
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
## 2020-02-03 ENCOUNTER — Ambulatory Visit (INDEPENDENT_AMBULATORY_CARE_PROVIDER_SITE_OTHER): Payer: BC Managed Care – PPO | Admitting: Licensed Clinical Social Worker

## 2020-02-03 ENCOUNTER — Other Ambulatory Visit: Payer: Self-pay

## 2020-02-03 DIAGNOSIS — F411 Generalized anxiety disorder: Secondary | ICD-10-CM

## 2020-02-09 NOTE — BH Specialist Note (Signed)
Integrated Behavioral Health Initial In-Person Visit  MRN: [id number] Name: Wendy Jensen  Number of Integrated Behavioral Health Clinician visits:: 1/6 Session Start time: 2:30 PM  Session End time: 3:05 PM Total time: 35  minutes  Types of Service: Individual psychotherapy  Interpretor:No. Interpretor Name and Language: NA   Subjective: Wendy Jensen is a 59 y.o. female accompanied by self Patient was referred by Dr. Earlene Plater for anxiety. Patient reports the following symptoms/concerns: Pt reports difficulty managing anxiety symptoms triggered by grief, financial strain, and difficulty managing medical conditions. Symptoms include crying, feeling overwhelmed, heart palpitations, excessive worrying, withdrawn behavior, and low appetite Duration of problem: 1 year; Severity of problem: moderate  Objective: Mood: Anxious and Affect: Appropriate Risk of harm to self or others: No plan to harm self or others  Life Context: Family and Social: Pt is grieving the loss of mother School/Work: Pt reports that she has had to go out on FMLA twice in one year Self-Care: Pt is interested in medication management Life Changes: Pt is coping with the loss of mother, financial strain, and difficulty managing chronic medical conditions  Patient and/or Family's Strengths/Protective Factors: Social and Emotional competence, Concrete supports in place (healthy food, safe environments, etc.) and Sense of purpose  Goals Addressed: Patient will: 1. Reduce symptoms of: anxiety Pt is interested in medication management to assist with management of symptoms 2. Increase knowledge and/or ability of: coping skills Pt agreed to continue taking medications for blood pressure and track readings conducted at home. Pt agreed to spend time outside, garden, play music, or pray to cope with stressors 3. Demonstrate ability to: Increase healthy adjustment to current life circumstances and Increase adequate  support systems for patient/family Pt agreed to follow up on dental resources   Progress towards Goals: Ongoing  Interventions: Interventions utilized: Solution-Focused Strategies, Supportive Counseling and Link to [facility]  Standardized Assessments completed: GAD-7 and PHQ 2&9  Patient and/or Family Response: Pt was engaged during session and was successful in identifying strategies to assist in management of symptoms. Supportive resources were provided  Patient Centered Plan: Patient is on the following Treatment Plan(s):  Anxiety  Assessment: Patient currently experiencing difficulty managing increase in anxiety symptoms.   Patient may benefit from therapy and medication management. Healthy strategies identified and supportive resources provided to assist with dental needs.  Plan: 1. Follow up with behavioral health clinician on : 02/24/2020 2. Behavioral recommendations: Utilize strategies and resources discussed 3. Referral(s): Integrated Art gallery manager (In Clinic) and [employer] [facility]:  Dental 4. "From scale of 1-10, how likely are you to follow plan?":   Bridgett Larsson, LCSW 02/09/2020 11:18 AM

## 2020-02-10 ENCOUNTER — Ambulatory Visit (INDEPENDENT_AMBULATORY_CARE_PROVIDER_SITE_OTHER): Payer: BC Managed Care – PPO | Admitting: Physician Assistant

## 2020-02-10 ENCOUNTER — Other Ambulatory Visit: Payer: Self-pay

## 2020-02-10 ENCOUNTER — Encounter: Payer: Self-pay | Admitting: Physician Assistant

## 2020-02-10 VITALS — BP 175/96 | HR 60 | Temp 97.4°F | Resp 18 | Ht 64.0 in | Wt 155.0 lb

## 2020-02-10 DIAGNOSIS — E7800 Pure hypercholesterolemia, unspecified: Secondary | ICD-10-CM | POA: Diagnosis not present

## 2020-02-10 DIAGNOSIS — F4321 Adjustment disorder with depressed mood: Secondary | ICD-10-CM | POA: Diagnosis not present

## 2020-02-10 DIAGNOSIS — F411 Generalized anxiety disorder: Secondary | ICD-10-CM | POA: Diagnosis not present

## 2020-02-10 DIAGNOSIS — I10 Essential (primary) hypertension: Secondary | ICD-10-CM | POA: Diagnosis not present

## 2020-02-10 MED ORDER — SERTRALINE HCL 50 MG PO TABS: 50.0000 mg | ORAL_TABLET | Freq: Every day | ORAL | 3 refills | Status: DC

## 2020-02-10 MED ORDER — HYDROCHLOROTHIAZIDE 25 MG PO TABS: 25.0000 mg | ORAL_TABLET | Freq: Every day | ORAL | 0 refills | Status: DC

## 2020-02-10 MED ORDER — ROSUVASTATIN CALCIUM 20 MG PO TABS: 20.0000 mg | ORAL_TABLET | Freq: Every day | ORAL | 0 refills | Status: DC

## 2020-02-10 MED ORDER — HYDROXYZINE HCL 10 MG PO TABS: 10.0000 mg | ORAL_TABLET | Freq: Three times a day (TID) | ORAL | 0 refills | Status: DC | PRN

## 2020-02-10 NOTE — Progress Notes (Signed)
Patient verified DOB Patient reports new BP medication giving a "drawn up" feeling in her right arm. Patient has taken BP this morning reporting no relief. Patient denies pain at this time. Patient reports home BP's at 170-200's.

## 2020-02-10 NOTE — Patient Instructions (Addendum)
Start taking Vit D 2,000 units over the counter  Start Zoloft 50 mg once daily Start taking hydroxyzine 10 mg three times a day as needed for increased anxiety Start taking HCTZ 25mg  once daily and keep taking Coreg 6.25mg  twice a day   I hope that you feel better soon  , PA-C Physician Assistant [facility]    Generalized Anxiety Disorder, Adult Generalized anxiety disorder (GAD) is a mental health disorder. People with this condition constantly worry about everyday events. Unlike normal anxiety, worry related to GAD is not triggered by a specific event. These worries also do not fade or get better with time. GAD interferes with life functions, including relationships, work, and school. GAD can vary from mild to severe. People with severe GAD can have intense waves of anxiety with physical symptoms (panic attacks). What are the causes? The exact cause of GAD is not known. What increases the risk? This condition is more likely to develop in:  Women.  People who have a family history of anxiety disorders.  People who are very shy.  People who experience very stressful life events, such as the death of a loved one.  People who have a very stressful family environment. What are the signs or symptoms? People with GAD often worry excessively about many things in their lives, such as their health and family. They may also be overly concerned about:  Doing well at work.  Being on time.  Natural disasters.  Friendships. Physical symptoms of GAD include:  Fatigue.  Muscle tension or having muscle twitches.  Trembling or feeling shaky.  Being easily startled.  Feeling like your heart is pounding or racing.  Feeling out of breath or like you cannot take a deep breath.  Having trouble falling asleep or staying asleep.  Sweating.  Nausea, diarrhea, or irritable bowel syndrome  (IBS).  Headaches.  Trouble concentrating or remembering facts.  Restlessness.  Irritability. How is this diagnosed? Your health care provider can diagnose GAD based on your symptoms and medical history. You will also have a physical exam. The health care provider will ask specific questions about your symptoms, including how severe they are, when they started, and if they come and go. Your health care provider may ask you about your use of alcohol or drugs, including prescription medicines. Your health care provider may refer you to a mental health specialist for further evaluation. Your health care provider will do a thorough examination and may perform additional tests to rule out other possible causes of your symptoms. To be diagnosed with GAD, a person must have anxiety that:  Is out of his or her control.  Affects several different aspects of his or her life, such as work and relationships.  Causes distress that makes him or her unable to take part in normal activities.  Includes at least three physical symptoms of GAD, such as restlessness, fatigue, trouble concentrating, irritability, muscle tension, or sleep problems. Before your health care provider can confirm a diagnosis of GAD, these symptoms must be present more days than they are not, and they must last for six months or longer. How is this treated? The following therapies are usually used to treat GAD:  Medicine. Antidepressant medicine is usually prescribed for long-term daily control. Antianxiety medicines may be added in severe cases, especially when panic attacks occur.  Talk therapy (psychotherapy). Certain types of talk therapy can be helpful in treating GAD by providing support, education, and guidance. Options  include: ? Cognitive behavioral therapy (CBT). People learn coping skills and techniques to ease their anxiety. They learn to identify unrealistic or negative thoughts and behaviors and to replace them with  positive ones. ? Acceptance and commitment therapy (ACT). This treatment teaches people how to be mindful as a way to cope with unwanted thoughts and feelings. ? Biofeedback. This process trains you to manage your body's response (physiological response) through breathing techniques and relaxation methods. You will work with a therapist while machines are used to monitor your physical symptoms.  Stress management techniques. These include yoga, meditation, and exercise. A mental health specialist can help determine which treatment is best for you. Some people see improvement with one type of therapy. However, other people require a combination of therapies. Follow these instructions at home:  Take over-the-counter and prescription medicines only as told by your health care provider.  Try to maintain a normal routine.  Try to anticipate stressful situations and allow extra time to manage them.  Practice any stress management or self-calming techniques as taught by your health care provider.  Do not punish yourself for setbacks or for not making progress.  Try to recognize your accomplishments, even if they are small.  Keep all follow-up visits as told by your health care provider. This is important. Contact a health care provider if:  Your symptoms do not get better.  Your symptoms get worse.  You have signs of depression, such as: ? A persistently sad, cranky, or irritable mood. ? Loss of enjoyment in activities that used to bring you joy. ? Change in weight or eating. ? Changes in sleeping habits. ? Avoiding friends or family members. ? Loss of energy for normal tasks. ? Feelings of guilt or worthlessness. Get help right away if:  You have serious thoughts about hurting yourself or others. If you ever feel like you may hurt yourself or others, or have thoughts about taking your own life, get help right away. You can go to your nearest emergency department or call:  Your local  emergency services (911 in the [place]  A suicide crisis helpline, such as the National Suicide Prevention Lifeline at 726 608 1482. This is open 24 hours a day. Summary  Generalized anxiety disorder (GAD) is a mental health disorder that involves worry that is not triggered by a specific event.  People with GAD often worry excessively about many things in their lives, such as their health and family.  GAD may cause physical symptoms such as restlessness, trouble concentrating, sleep problems, frequent sweating, nausea, diarrhea, headaches, and trembling or muscle twitching.  A mental health specialist can help determine which treatment is best for you. Some people see improvement with one type of therapy. However, other people require a combination of therapies. This information is not intended to replace advice given to you by your health care provider. Make sure you discuss any questions you have with your health care provider. Document Revised: 02/09/2017 Document Reviewed: 01/18/2016 Elsevier Patient Education  2020 [employer].

## 2020-02-10 NOTE — Progress Notes (Signed)
Established Patient Office Visit  Subjective:  Patient ID: Wendy Jensen, female    DOB: 04/16/1960  Age: 59 y.o. MRN: [id number]  CC:  Chief Complaint  Patient presents with  . Blood Pressure Check    HPI Wendy Jensen states that she was started on Coreg 6.25 mg BID  at the beginning of the month.   Reports that she took 1 dose and states her right arm felt abnormal so she did not continue.  Reports that she was having elevated blood pressure readings so she started taking 25 mg of hydrochlorothiazide that was previously prescribed to her.  Reports that she only took this for 1 week and then decided last week to restart the carvedilol.  Reports she has been taking it twice a day, has not had any abnormal feelings in her right arm.  Reports blood pressure readings at home have continue to be elevated, earlier today was 165/107.  Had previously reported having a reaction to hydrochlorothiazide, but today states that she is able to tolerate the medication.  States that she does not like taking any medication in general.  Reports that she lost her mother in August, is still suffering from intense grief, states that she has difficulty doing anything she previously found pleasurable, states that she wakes up several times throughout the night with racing thoughts.  Reports that she has been having many episodes of crying, has lack of appetite.      Past Medical History:  Diagnosis Date  . GERD (gastroesophageal reflux disease)   . Hypertension   . Peptic ulcer   . Reflux     Past Surgical History:  Procedure Laterality Date  . COLONOSCOPY    . ESOPHAGEAL DILATION    . TUBAL LIGATION    . UPPER GI ENDOSCOPY      Family History  Problem Relation Age of Onset  . Hypertension Father     Social History   Socioeconomic History  . Marital status: Single    Spouse name: Not on file  . Number of children: Not on file  . Years of education: Not on file  . Highest  education level: Not on file  Occupational History  . Not on file  Tobacco Use  . Smoking status: Former Smoker    Packs/day: 0.15    Types: Cigarettes    Quit date: 09/10/2018    Years since quitting: 1.4  . Smokeless tobacco: Never Used  Vaping Use  . Vaping Use: Never used  Substance and Sexual Activity  . Alcohol use: Yes    Comment: 1 glass beer per day  . Drug use: No  . Sexual activity: Not Currently  Other Topics Concern  . Not on file  Social History Narrative  . Not on file   Social Determinants of Health   Financial Resource Strain:   . Difficulty of Paying Living Expenses: Not on file  Food Insecurity:   . Worried About Programme researcher, broadcasting/film/videounning Out of Food in the Last Year: Not on file  . Ran Out of Food in the Last Year: Not on file  Transportation Needs:   . Lack of Transportation (Medical): Not on file  . Lack of Transportation (Non-Medical): Not on file  Physical Activity:   . Days of Exercise per Week: Not on file  . Minutes of Exercise per Session: Not on file  Stress:   . Feeling of Stress : Not on file  Social Connections:   . Frequency of Communication with  Friends and Family: Not on file  . Frequency of Social Gatherings with Friends and Family: Not on file  . Attends Religious Services: Not on file  . Active Member of Clubs or Organizations: Not on file  . Attends Banker Meetings: Not on file  . Marital Status: Not on file  Intimate Partner Violence:   . Fear of Current or Ex-Partner: Not on file  . Emotionally Abused: Not on file  . Physically Abused: Not on file  . Sexually Abused: Not on file    Outpatient Medications Prior to Visit  Medication Sig Dispense Refill  . carvedilol (COREG) 6.25 MG tablet Take 1 tablet (6.25 mg total) by mouth 2 (two) times daily with a meal. 60 tablet 3  . rosuvastatin (CRESTOR) 20 MG tablet Take 1 tablet (20 mg total) by mouth daily. 90 tablet 0   No facility-administered medications prior to visit.     Allergies  Allergen Reactions  . Hydralazine Other (See Comments)  . Hydrochlorothiazide Other (See Comments)  . Losartan Other (See Comments)    ROS Review of Systems  Constitutional: Positive for fatigue.  HENT: Negative.   Eyes: Negative.   Respiratory: Negative.   Cardiovascular: Negative.   Gastrointestinal: Negative.   Endocrine: Negative.   Genitourinary: Negative.   Musculoskeletal: Negative.   Skin: Negative.   Allergic/Immunologic: Negative.   Neurological: Negative.   Hematological: Negative.   Psychiatric/Behavioral: Positive for dysphoric mood and sleep disturbance. Negative for self-injury and suicidal ideas. The patient is nervous/anxious.       Objective:    Physical Exam Vitals and nursing note reviewed.  Constitutional:      Appearance: Normal appearance.  HENT:     Head: Normocephalic and atraumatic.     Right Ear: External ear normal.     Left Ear: External ear normal.     Nose: Nose normal.     Mouth/Throat:     Mouth: Mucous membranes are moist.     Pharynx: Oropharynx is clear.  Eyes:     Extraocular Movements: Extraocular movements intact.     Conjunctiva/sclera: Conjunctivae normal.     Pupils: Pupils are equal, round, and reactive to light.  Cardiovascular:     Rate and Rhythm: Normal rate and regular rhythm.     Pulses: Normal pulses.     Heart sounds: Normal heart sounds.  Pulmonary:     Effort: Pulmonary effort is normal.     Breath sounds: Normal breath sounds.  Abdominal:     General: Abdomen is flat.     Palpations: Abdomen is soft.  Musculoskeletal:        General: Normal range of motion.     Cervical back: Normal range of motion and neck supple.  Skin:    General: Skin is warm and dry.  Neurological:     General: No focal deficit present.     Mental Status: She is alert and oriented to person, place, and time.  Psychiatric:        Attention and Perception: Attention normal.        Mood and Affect: Affect is  tearful.        Speech: Speech normal.        Behavior: Behavior normal. Behavior is cooperative.        Thought Content: Thought content normal.        Cognition and Memory: Cognition and memory normal.        Judgment: Judgment normal.  BP (!) 175/96 (BP Location: Right Arm, Patient Position: Sitting, Cuff Size: Normal)   Pulse 60   Temp (!) 97.4 F (36.3 C) (Oral)   Resp 18   Ht 5\' 4"  (1.626 m)   Wt 155 lb (70.3 kg)   SpO2 100%   BMI 26.61 kg/m  Wt Readings from Last 3 Encounters:  02/10/20 155 lb (70.3 kg)  01/15/20 156 lb (70.8 kg)  06/24/19 165 lb (74.8 kg)     Health Maintenance Due  Topic Date Due  . COVID-19 Vaccine (1) Never done  . INFLUENZA VACCINE  Never done    There are no preventive care reminders to display for this patient.  No results found for: TSH Lab Results  Component Value Date   WBC 6.8 06/24/2019   HGB 14.4 06/24/2019   HCT 43.3 06/24/2019   MCV 91 06/24/2019   PLT 300 06/24/2019   Lab Results  Component Value Date   NA 142 06/24/2019   K 4.9 06/24/2019   CO2 22 06/24/2019   GLUCOSE 94 06/24/2019   BUN 18 06/24/2019   CREATININE 1.00 06/24/2019   BILITOT 0.5 06/24/2019   ALKPHOS 110 06/24/2019   AST 26 06/24/2019   ALT 15 06/24/2019   PROT 7.5 06/24/2019   ALBUMIN 4.6 06/24/2019   CALCIUM 9.6 06/24/2019   ANIONGAP 5 04/11/2018   No results found for: CHOL No results found for: HDL No results found for: LDLCALC No results found for: TRIG No results found for: CHOLHDL No results found for: 04/13/2018    Assessment & Plan:   Problem List Items Addressed This Visit    None    Visit Diagnoses    GAD (generalized anxiety disorder)    -  Primary   Relevant Medications   sertraline (ZOLOFT) 50 MG tablet   hydrOXYzine (ATARAX/VISTARIL) 10 MG tablet   Pure hypercholesterolemia       Relevant Medications   hydrochlorothiazide (HYDRODIURIL) 25 MG tablet   rosuvastatin (CRESTOR) 20 MG tablet   Essential hypertension        Relevant Medications   hydrochlorothiazide (HYDRODIURIL) 25 MG tablet   rosuvastatin (CRESTOR) 20 MG tablet   Grief        1. Pure hypercholesterolemia Refilled medication, continue current regimen - rosuvastatin (CRESTOR) 20 MG tablet; Take 1 tablet (20 mg total) by mouth daily.  Dispense: 90 tablet; Refill: 0  2. GAD (generalized anxiety disorder) Patient agreeable to trial of Zoloft, trial hydroxyzine every 8 hours as needed, continue with counseling services - sertraline (ZOLOFT) 50 MG tablet; Take 1 tablet (50 mg total) by mouth daily.  Dispense: 30 tablet; Refill: 3 - hydrOXYzine (ATARAX/VISTARIL) 10 MG tablet; Take 1 tablet (10 mg total) by mouth 3 (three) times daily as needed.  Dispense: 30 tablet; Refill: 0  3. Essential hypertension Continue carvedilol, trial hydrochlorothiazide 25 mg, patient education given on checking blood pressure, keeping written log Denies any hypertensive symptoms, patient has follow-up appointment with Dr. [id number] next week.  Red flag warnings given for prompt evaluation at emergency department - hydrochlorothiazide (HYDRODIURIL) 25 MG tablet; Take 1 tablet (25 mg total) by mouth daily.  Dispense: 30 tablet; Refill: 0  4. Grief Continue current counseling   Meds ordered this encounter  Medications  . hydrochlorothiazide (HYDRODIURIL) 25 MG tablet    Sig: Take 1 tablet (25 mg total) by mouth daily.    Dispense:  30 tablet    Refill:  0    Patient states she is able  to take this medication without issue - lowered dose    Order Specific Question:   Supervising Provider    Answer:   Storm Frisk [[id number]]  . sertraline (ZOLOFT) 50 MG tablet    Sig: Take 1 tablet (50 mg total) by mouth daily.    Dispense:  30 tablet    Refill:  3    Order Specific Question:   Supervising Provider    Answer:   Delford Field, PATRICK E [[id number]]  . hydrOXYzine (ATARAX/VISTARIL) 10 MG tablet    Sig: Take 1 tablet (10 mg total) by mouth 3 (three) times daily as needed.     Dispense:  30 tablet    Refill:  0    Order Specific Question:   Supervising Provider    Answer:   Delford Field, PATRICK E [[id number]]  . rosuvastatin (CRESTOR) 20 MG tablet    Sig: Take 1 tablet (20 mg total) by mouth daily.    Dispense:  90 tablet    Refill:  0    Order Specific Question:   Supervising Provider    Answer:   Storm Frisk [[id number]]    I have reviewed the patient's medical history (PMH, PSH, Social History, Family History, Medications, and allergies) , and have been updated if relevant. I spent 30 minutes reviewing chart and  face to face time with patient.    Follow-up: No follow-ups on file.    Kasandra Knudsen Mayers, PA-C

## 2020-02-17 ENCOUNTER — Ambulatory Visit: Payer: BC Managed Care – PPO | Admitting: Internal Medicine

## 2020-02-24 ENCOUNTER — Ambulatory Visit: Payer: BC Managed Care – PPO | Admitting: Licensed Clinical Social Worker

## 2020-03-03 ENCOUNTER — Other Ambulatory Visit: Payer: Self-pay

## 2020-03-03 ENCOUNTER — Encounter: Payer: Self-pay | Admitting: Family

## 2020-03-03 ENCOUNTER — Ambulatory Visit (INDEPENDENT_AMBULATORY_CARE_PROVIDER_SITE_OTHER): Payer: BC Managed Care – PPO | Admitting: Family

## 2020-03-03 VITALS — BP 171/97 | HR 73 | Wt 157.0 lb

## 2020-03-03 DIAGNOSIS — K0889 Other specified disorders of teeth and supporting structures: Secondary | ICD-10-CM | POA: Diagnosis not present

## 2020-03-03 DIAGNOSIS — I10 Essential (primary) hypertension: Secondary | ICD-10-CM | POA: Diagnosis not present

## 2020-03-03 MED ORDER — TORSEMIDE 5 MG PO TABS: 5.0000 mg | ORAL_TABLET | Freq: Every day | ORAL | 0 refills | Status: DC

## 2020-03-03 MED ORDER — CARVEDILOL 3.125 MG PO TABS: 3.1250 mg | ORAL_TABLET | Freq: Two times a day (BID) | ORAL | 0 refills | Status: DC

## 2020-03-03 NOTE — Progress Notes (Signed)
BP f/u Pt tooth cracked during rooming experiencing tooth pain  Pain scale-9 Medications pt taking 1/2 stated makes her sick that's why she takes 1/2

## 2020-03-03 NOTE — Progress Notes (Signed)
Patient ID: Wendy Jensen, female    DOB: [birth year]  MRN: [id number]  CC: Hypertension   Subjective: Wendy Jensen is a 59 y.o. female who presents for hypertension follow-up.  1. HYPERTENSION FOLLOW-UP:  01/15/2020: Visit with Dr. Earlene Plater. Discussed with patient that given significantly elevated blood pressure and history of hypertension medication needed to be started at that time. Intolerant of several medications in the past. Began on beta-blocker. Return in 2 weeks for blood pressure checkup.   02/10/2020:  Visit with physician assistant Cari Mayers. Continued on Carvedilol, trial Hydrochlorothiazide. Follow-up next week for blood pressure checkup.  03/03/2020: Today reports she quit taking both Carvedilol and Hydrochlorothiazide 1 week ago because both medications cause her to be sick. States today she hasn't been feeling well with a headache and took a half dose of Carvedilol and a half dose of Hydrochlorothiazide and still not feeling well. Requesting to discontinue both Carvedilol and Hydrochlorothiazide and for provider to replace both medications with 1 pill that will successfully bring her blood pressure down. Also says that she would be willing to take half dose of Carvedilol but that she is definitely finished with Hydrochlorothiazide.   Currently taking: see medication list Have you taken your blood pressure medication today: Only took half dose of both medications  Med Adherence: []  Yes    [x]  No Medication side effects: [x]  Yes, reports medications make her feel sick Adherence with salt restriction: [x]  Yes    []  No Exercise: Yes []  No [x]  Home Monitoring?: [x]  Yes    []  No Monitoring Frequency: [x]  Yes    []  No Home BP results range: 150's-170's/90's Smoking []  Yes [x]  No SOB? []  Yes    [x]  No Chest Pain?: []  Yes    [x]  No Leg swelling?: []  Yes    [x]  No Headaches?: [x]  Yes, sometimes  Dizziness? [x]  Yes    []  No  2. TOOTH PAIN: Requests referral  to Dentist.  Patient Active Problem List   Diagnosis Date Noted  . Aortic calcification (HCC) 06/25/2019  . Osteoarthritis of cervicothoracic spine 06/25/2019  . Epidermoid cyst 04/30/2018  . Mixed hyperlipidemia 12/10/2017  . Allergic rhinitis 07/31/2017  . Generalized anxiety disorder 07/31/2017  . Nicotine dependence 07/31/2017  . Hypertension 09/10/2008  . GERD 09/10/2008     Current Outpatient Medications on File Prior to Visit  Medication Sig Dispense Refill  . hydrochlorothiazide (HYDRODIURIL) 25 MG tablet Take 1 tablet (25 mg total) by mouth daily. (Patient taking differently: Take 25 mg by mouth daily. Pt taking 1/2 tab) 30 tablet 0  . hydrOXYzine (ATARAX/VISTARIL) 10 MG tablet Take 1 tablet (10 mg total) by mouth 3 (three) times daily as needed. 30 tablet 0  . rosuvastatin (CRESTOR) 20 MG tablet Take 1 tablet (20 mg total) by mouth daily. 90 tablet 0  . sertraline (ZOLOFT) 50 MG tablet Take 1 tablet (50 mg total) by mouth daily. 30 tablet 3   No current facility-administered medications on file prior to visit.    Allergies  Allergen Reactions  . Amlodipine Benzoate Other (See Comments)    Patient reports this made her feel sick.  . Hydralazine Other (See Comments)  . Hydrochlorothiazide Other (See Comments)  . Lisinopril Other (See Comments)    Patient reports this made her feel sick.  . Losartan Other (See Comments)    Social History   Socioeconomic History  . Marital status: Single    Spouse name: Not on file  . Number of children: Not  on file  . Years of education: Not on file  . Highest education level: Not on file  Occupational History  . Not on file  Tobacco Use  . Smoking status: Former Smoker    Packs/day: 0.15    Types: Cigarettes    Quit date: 09/10/2018    Years since quitting: 1.4  . Smokeless tobacco: Never Used  Vaping Use  . Vaping Use: Never used  Substance and Sexual Activity  . Alcohol use: Yes    Comment: 1 glass beer per day  .  Drug use: No  . Sexual activity: Not Currently  Other Topics Concern  . Not on file  Social History Narrative  . Not on file   Social Determinants of Health   Financial Resource Strain: Not on file  Food Insecurity: Not on file  Transportation Needs: Not on file  Physical Activity: Not on file  Stress: Not on file  Social Connections: Not on file  Intimate Partner Violence: Not on file    Family History  Problem Relation Age of Onset  . Hypertension Father     Past Surgical History:  Procedure Laterality Date  . COLONOSCOPY    . ESOPHAGEAL DILATION    . TUBAL LIGATION    . UPPER GI ENDOSCOPY      ROS: Review of Systems Negative except as stated above  PHYSICAL EXAM: BP (!) 171/97 (BP Location: Left Arm, Patient Position: Sitting)   Pulse 73   Wt 157 lb (71.2 kg)   SpO2 94%   BMI 26.95 kg/m   Physical Exam Constitutional:      Appearance: Normal appearance.  Eyes:     Extraocular Movements: Extraocular movements intact.     Pupils: Pupils are equal, round, and reactive to light.  Cardiovascular:     Rate and Rhythm: Normal rate and regular rhythm.     Pulses: Normal pulses.     Heart sounds: Normal heart sounds.  Pulmonary:     Effort: Pulmonary effort is normal.     Breath sounds: Normal breath sounds.  Neurological:     Mental Status: She is alert.  Psychiatric:        Mood and Affect: Affect is angry.        Behavior: Behavior is agitated.     ASSESSMENT AND PLAN: 1. Essential hypertension: - Blood pressure not at goal during today's visit. Patient asymptomatic without chest pressure, chest pain, palpitations, and shortness of breath. - Today reports she quit taking both Carvedilol and Hydrochlorothiazide 1 week ago because both medications cause her to become sick. States prior to today's appointment she took a half dose of Carvedilol and a half dose of Hydrochlorothiazide.  - Patient requesting to discontinue both Carvedilol and  Hydrochlorothiazide and for provider to replace both medications with 1 pill that will successfully bring her blood pressure down. Also, says that she would be willing to take half dose of Carvedilol because it doesn't make her feel too sick but that she is definitely finished with Hydrochlorothiazide. Counseled patient that given the significant elevation of blood pressure it is recommended that she take at lest two medications, patient agreeable.  -Patient with chronic history of allergic reactions to the following hypertensive medications: Amlodipine, Hydralazine, Lisinopril, Losartan, and Metoprolol. - Hydrochlorothiazide discontinued and added to allergies list. - Decrease Carvedilol from  6.25 mg twice daily to 3.125 mg twice daily. 1 month supply prescribed. - Begin Torsemide as prescribed. 1 month supply prescribed. - BMP to check kidney  function and electrolyte balance. Patient will return to lab within 1 week for this.  - Follow-up with primary physician in 2 weeks for blood pressure checkup. - Patient was given clear instructions to go to Emergency Department or return to medical center if symptoms don't improve, worsen, or new problems develop.The patient verbalized understanding. - carvedilol (COREG) 3.125 MG tablet; Take 1 tablet (3.125 mg total) by mouth 2 (two) times daily with a meal.  Dispense: 60 tablet; Refill: 0 - Basic Metabolic Panel - torsemide (DEMADEX) 5 MG tablet; Take 1 tablet (5 mg total) by mouth daily.  Dispense: 30 tablet; Refill: 0  2. Tooth pain: - Referral to Dentistry for further evaluation and management. - Ambulatory referral to Dentistry  Patient was given the opportunity to ask questions.  Patient verbalized understanding of the plan and was able to repeat key elements of the plan. Patient was given clear instructions to go to Emergency Department or return to medical center if symptoms don't improve, worsen, or new problems develop.The patient verbalized  understanding.   Orders Placed This Encounter  Procedures  . Basic Metabolic Panel  . Ambulatory referral to Dentistry     Requested Prescriptions   Signed Prescriptions Disp Refills  . carvedilol (COREG) 3.125 MG tablet 60 tablet 0    Sig: Take 1 tablet (3.125 mg total) by mouth 2 (two) times daily with a meal.  . torsemide (DEMADEX) 5 MG tablet 30 tablet 0    Sig: Take 1 tablet (5 mg total) by mouth daily.    Return in about 2 weeks (around 03/17/2020) for Dr. Earlene Plater.  Rema Fendt, NP

## 2020-03-03 NOTE — Patient Instructions (Signed)

## 2020-03-08 ENCOUNTER — Other Ambulatory Visit: Payer: Self-pay | Admitting: Physician Assistant

## 2020-03-08 DIAGNOSIS — I10 Essential (primary) hypertension: Secondary | ICD-10-CM

## 2020-03-16 ENCOUNTER — Encounter: Payer: Self-pay | Admitting: Internal Medicine

## 2020-03-16 ENCOUNTER — Ambulatory Visit (INDEPENDENT_AMBULATORY_CARE_PROVIDER_SITE_OTHER): Payer: Self-pay | Admitting: Internal Medicine

## 2020-03-16 ENCOUNTER — Other Ambulatory Visit: Payer: Self-pay

## 2020-03-16 VITALS — BP 148/97 | HR 67 | Temp 97.3°F | Resp 18 | Ht 64.0 in | Wt 152.6 lb

## 2020-03-16 DIAGNOSIS — I10 Essential (primary) hypertension: Secondary | ICD-10-CM

## 2020-03-16 NOTE — Progress Notes (Signed)
Reports throbbing/pulsating to middle of head, notices after taking BP meds

## 2020-03-16 NOTE — Progress Notes (Signed)
  Subjective:    Wendy Jensen - 60 y.o. female MRN [id number]  Date of birth: [birth year]  HPI  Wendy Jensen is here for follow up of HTN. Patient with chronic history of allergic reactions to the following hypertensive medications: Amlodipine, Hydralazine, Lisinopril, Losartan, and Metoprolol. At last visit with provider Ricky Stabs NP, HCTZ was discontinued due to patient reporting that it made her "feel sick". Coreg was cut in half to a 3.125 mg BID dose and Torsemide 5 mg was started.   Since that visit, patient has resumed taking HCTZ. She increased Coreg back to 6.25 mg BID. She just started Torsemide this past week. Very worried about her BP shooting up and not being taken seriously over the past 2 years. Reports BPs at home typically 170s/90s but also has seen 120/80s. Today was 143/92 at 11 am.     Health Maintenance:  Health Maintenance Due  Topic Date Due  . COVID-19 Vaccine (1) Never done    -  reports that she quit smoking about 18 months ago. Her smoking use included cigarettes. She smoked 0.15 packs per day. She has never used smokeless tobacco. - Review of Systems: Per HPI. - Past Medical History: Patient Active Problem List   Diagnosis Date Noted  . Aortic calcification (HCC) 06/25/2019  . Osteoarthritis of cervicothoracic spine 06/25/2019  . Epidermoid cyst 04/30/2018  . Mixed hyperlipidemia 12/10/2017  . Allergic rhinitis 07/31/2017  . Generalized anxiety disorder 07/31/2017  . Nicotine dependence 07/31/2017  . Hypertension 09/10/2008  . GERD 09/10/2008   - Medications: reviewed and updated   Objective:   Physical Exam BP (!) 148/97 (BP Location: Right Arm, Patient Position: Sitting, Cuff Size: Normal)   Pulse 67   Temp (!) 97.3 F (36.3 C) (Temporal)   Resp 18   Ht 5\' 4"  (1.626 m)   Wt 152 lb 9.6 oz (69.2 kg)   SpO2 97%   BMI 26.19 kg/m  Physical Exam Constitutional:      General: She is not in acute distress.    Appearance: She is  not diaphoretic.  Cardiovascular:     Rate and Rhythm: Normal rate.  Pulmonary:     Effort: Pulmonary effort is normal. No respiratory distress.  Musculoskeletal:        General: Normal range of motion.  Skin:    General: Skin is warm and dry.  Neurological:     Mental Status: She is alert and oriented to person, place, and time.  Psychiatric:        Mood and Affect: Affect normal.        Judgment: Judgment normal.            Assessment & Plan:   1. Primary hypertension BP elevated today, but much better control than at recent previous office visits. Given patient's intolerance to several medications and difficult control of BP, discussed option of increasing Torsemide dose and monitoring at this office versus referral to cardiology HTN clinic. Patient elected for the latter. Referral placed. Continue current regimen as is given improved control and asymptomatic. Strict precautions to seek emergency care discussed.  - Ambulatory referral to Cardiology   , D.O. 03/16/2020, 2:53 PM Primary Care at [facility]

## 2020-03-23 ENCOUNTER — Telehealth: Payer: Self-pay

## 2020-03-23 NOTE — Telephone Encounter (Signed)
Spoke w/ pt and she reports home readings ranging from 170-180s systolic and in low 100s diastolic, pt not interested in any medication changes or further appts w/ PCP, wants to go to the specialist/cardiology as referred, reached out to CVD and got pt scheduled for 03/26/20 @ 8:40a at the [facility] location to establish care, per scheduling pt can come in to establish w/ general cardiologist and then they can set her up with HTN clinic, pt aware of appt date/time and was also given address/phone #. Pt also requested to speak w/ LCSW again due to increased anxiety causing elevated BP readings, sent message to LCSW to reach out/schedule.

## 2020-03-23 NOTE — Telephone Encounter (Signed)
Pt requesting call, previously spoke w/ you in 01/2020 and is having increased anxiety causing elevated BP readings.

## 2020-03-26 ENCOUNTER — Telehealth: Payer: Self-pay

## 2020-03-26 ENCOUNTER — Ambulatory Visit: Payer: Self-pay | Admitting: Cardiology

## 2020-03-26 NOTE — Progress Notes (Deleted)
Cardiology Office Note:    Date:  03/26/2020   ID:  Wendy Jensen, DOB [birth year], MRN [id number]  PCP:  Arvilla Market, DO  Cardiologist:  No primary care provider on file.  Electrophysiologist:  None   Referring MD: Leary Roca*   No chief complaint on file. ***  History of Present Illness:    Wendy Jensen is a 60 y.o. female with a hx of hypertension, PUD, hyperlipidemia who is referred by Dr. Earlene Plater for evaluation of hypertension.  She has history of allergy to amlodipine, hydralazine, lisinopril, losartan, and metoprolol.   Past Medical History:  Diagnosis Date  . GERD (gastroesophageal reflux disease)   . Hypertension   . Peptic ulcer   . Reflux     Past Surgical History:  Procedure Laterality Date  . COLONOSCOPY    . ESOPHAGEAL DILATION    . TUBAL LIGATION    . UPPER GI ENDOSCOPY      Current Medications: No outpatient medications have been marked as taking for the 03/26/20 encounter (Appointment) with Little Ishikawa, MD.     Allergies:   Amlodipine benzoate, Amlodipine besylate, Hydralazine, Hydrochlorothiazide, Lisinopril, Losartan, and Metoprolol tartrate   Social History   Socioeconomic History  . Marital status: Single    Spouse name: Not on file  . Number of children: Not on file  . Years of education: Not on file  . Highest education level: Not on file  Occupational History  . Not on file  Tobacco Use  . Smoking status: Former Smoker    Packs/day: 0.15    Types: Cigarettes    Quit date: 09/10/2018    Years since quitting: 1.5  . Smokeless tobacco: Never Used  Vaping Use  . Vaping Use: Never used  Substance and Sexual Activity  . Alcohol use: Yes    Comment: 1 glass beer per day  . Drug use: No  . Sexual activity: Not Currently  Other Topics Concern  . Not on file  Social History Narrative  . Not on file   Social Determinants of Health   Financial Resource Strain: Not on file  Food  Insecurity: Not on file  Transportation Needs: Not on file  Physical Activity: Not on file  Stress: Not on file  Social Connections: Not on file     Family History: The patient's ***family history includes Hypertension in her father.  ROS:   Please see the history of present illness.    *** All other systems reviewed and are negative.  EKGs/Labs/Other Studies Reviewed:    The following studies were reviewed today: ***  EKG:  EKG is *** ordered today.  The ekg ordered today demonstrates ***  Recent Labs: 06/24/2019: ALT 15; BUN 18; Creatinine, Ser 1.00; Hemoglobin 14.4; Platelets 300; Potassium 4.9; Sodium 142  Recent Lipid Panel    Component Value Date/Time   LDLDIRECT 179 (H) 06/24/2019 1412    Physical Exam:    VS:  There were no vitals taken for this visit.    Wt Readings from Last 3 Encounters:  03/16/20 152 lb 9.6 oz (69.2 kg)  03/03/20 157 lb (71.2 kg)  02/10/20 155 lb (70.3 kg)     GEN: *** Well nourished, well developed in no acute distress HEENT: Normal NECK: No JVD; No carotid bruits LYMPHATICS: No lymphadenopathy CARDIAC: ***RRR, no murmurs, rubs, gallops RESPIRATORY:  Clear to auscultation without rales, wheezing or rhonchi  ABDOMEN: Soft, non-tender, non-distended MUSCULOSKELETAL:  No edema; No deformity  SKIN: Warm and dry  NEUROLOGIC:  Alert and oriented x 3 PSYCHIATRIC:  Normal affect   ASSESSMENT:    No diagnosis found. PLAN:    Hypertension: Allergies to multiple antihypertensives.  Currently on hydrochlorothiazide 25 mg daily, carvedilol 3.125 mg twice daily, and torsemide 5 mg daily. -Check BMP  Hyperlipidemia: On rosuvastatin 20 mg daily.  LDL 179 on 06/24/2019  RTC in***  Medication Adjustments/Labs and Tests Ordered: Current medicines are reviewed at length with the patient today.  Concerns regarding medicines are outlined above.  No orders of the defined types were placed in this encounter.  No orders of the defined types were  placed in this encounter.   There are no Patient Instructions on file for this visit.   Signed, Little Ishikawa, MD  03/26/2020 6:15 AM    [facility]

## 2020-03-26 NOTE — Telephone Encounter (Signed)
Left voice message for Wendy Jensen to please call office

## 2020-03-29 NOTE — Telephone Encounter (Signed)
Call placed to patient. Patient reports anxiety/depression symptoms have been fluctuating, in addition, to her blood pressure. Pt has been spending time with family and celebrated late mother's birthday yesterday by reflecting and creating shrine to honor mother. Pt scheduled appointment with LCSW for 04/08/20

## 2020-04-01 ENCOUNTER — Other Ambulatory Visit: Payer: Self-pay | Admitting: Family

## 2020-04-01 DIAGNOSIS — I10 Essential (primary) hypertension: Secondary | ICD-10-CM

## 2020-04-03 NOTE — Progress Notes (Signed)
Cardiology Office Note:    Date:  04/05/2020   ID:  Wendy Jensen, DOB 1960-10-28, MRN [id number]  PCP:  Arvilla Market, DO  Cardiologist:  No primary care provider on file.  Electrophysiologist:  None   Referring MD: Leary Roca*   Chief Complaint  Patient presents with  . Hypertension    History of Present Illness:    Wendy Jensen is a 60 y.o. female with a hx of hypertension, PUD, hyperlipidemia who is referred by Dr. Earlene Plater for evaluation of hypertension.  She has history of allergy to amlodipine, hydralazine, lisinopril, losartan, and metoprolol.  HCTZ is also listed in allergies, but reports she tolerates OK.  States that she currently is only taking carvedilol 3.125 mg p.o. BID, but sometimes will take the 6.25 mg dose if her BP is elevated.  She is not taking hydrochlorothiazide and takes torsemide 5 mg only about 4 days/week.  She denies any shortness of breath.  Reports occasional chest pain that she describes as left-sided pain that last for few seconds and resolves.  Reports occasional lightheadedness.  Denies any syncope.  No lower extremity edema.  Quit smoking last year.  No known history of heart disease in her immediate family.   Past Medical History:  Diagnosis Date  . GERD (gastroesophageal reflux disease)   . Hypertension   . Peptic ulcer   . Reflux     Past Surgical History:  Procedure Laterality Date  . COLONOSCOPY    . ESOPHAGEAL DILATION    . TUBAL LIGATION    . UPPER GI ENDOSCOPY      Current Medications: Current Meds  Medication Sig  . chlorthalidone (HYGROTON) 25 MG tablet Take 0.5 tablets (12.5 mg total) by mouth daily.  . hydrOXYzine (ATARAX/VISTARIL) 10 MG tablet Take 1 tablet (10 mg total) by mouth 3 (three) times daily as needed.  . rosuvastatin (CRESTOR) 20 MG tablet Take 1 tablet (20 mg total) by mouth daily.  . sertraline (ZOLOFT) 50 MG tablet Take 1 tablet (50 mg total) by mouth daily.  .  [DISCONTINUED] carvedilol (COREG) 3.125 MG tablet Take 1 tablet (3.125 mg total) by mouth 2 (two) times daily with a meal. (Patient taking differently: Take 6.25 mg by mouth 2 (two) times daily with a meal. Pt reports takiing 6.25mg  BID)  . [DISCONTINUED] hydrochlorothiazide (HYDRODIURIL) 25 MG tablet TAKE 1 TABLET(25 MG) BY MOUTH DAILY  . [DISCONTINUED] torsemide (DEMADEX) 5 MG tablet Take 1 tablet (5 mg total) by mouth daily.     Allergies:   Amlodipine benzoate, Amlodipine besylate, Hydralazine, Hydrochlorothiazide, Lisinopril, Losartan, and Metoprolol tartrate   Social History   Socioeconomic History  . Marital status: Single    Spouse name: Not on file  . Number of children: Not on file  . Years of education: Not on file  . Highest education level: Not on file  Occupational History  . Not on file  Tobacco Use  . Smoking status: Former Smoker    Packs/day: 0.15    Types: Cigarettes    Quit date: 09/10/2018    Years since quitting: 1.5  . Smokeless tobacco: Never Used  Vaping Use  . Vaping Use: Never used  Substance and Sexual Activity  . Alcohol use: Yes    Comment: 1 glass beer per day  . Drug use: No  . Sexual activity: Not Currently  Other Topics Concern  . Not on file  Social History Narrative  . Not on file   Social Determinants of Health  Financial Resource Strain: Not on file  Food Insecurity: Not on file  Transportation Needs: Not on file  Physical Activity: Not on file  Stress: Not on file  Social Connections: Not on file     Family History: The patient's family history includes Hypertension in her father.  ROS:   Please see the history of present illness.     All other systems reviewed and are negative.  EKGs/Labs/Other Studies Reviewed:    The following studies were reviewed today:   EKG:  EKG is ordered today.  The ekg ordered today demonstrates sinus rhythm, rate 65, first-degree AV block, nonspecific T wave flattening  Recent  Labs: 06/24/2019: ALT 15; BUN 18; Creatinine, Ser 1.00; Hemoglobin 14.4; Platelets 300; Potassium 4.9; Sodium 142  Recent Lipid Panel    Component Value Date/Time   LDLDIRECT 179 (H) 06/24/2019 1412    Physical Exam:    VS:  BP (!) 160/70   Pulse 65   Ht 5\' 4"  (1.626 m)   Wt 159 lb (72.1 kg)   BMI 27.29 kg/m     Wt Readings from Last 3 Encounters:  04/05/20 159 lb (72.1 kg)  03/16/20 152 lb 9.6 oz (69.2 kg)  03/03/20 157 lb (71.2 kg)     GEN:  in no acute distress HEENT: Normal NECK: No JVD; No carotid bruits LYMPHATICS: No lymphadenopathy CARDIAC: RRR, no murmurs, rubs, gallops RESPIRATORY:  Clear to auscultation without rales, wheezing or rhonchi  ABDOMEN: Soft, non-tender, non-distended MUSCULOSKELETAL:  No edema; No deformity  SKIN: Warm and dry NEUROLOGIC:  Alert and oriented x 3 PSYCHIATRIC:  Normal affect   ASSESSMENT:    1. Essential hypertension   2. Mixed hyperlipidemia   3. Medication management   4. Chest pain of uncertain etiology    PLAN:    Hypertension: Allergies to multiple antihypertensives.  Currently on carvedilol 3.125 mg twice daily, and takes torsemide 5 mg daily intermittently -Increase carvedilol to 6.25 mg twice daily -Start chlorthalidone 12.5 mg daily.  Stop torsemide. -Check CMET -Asked to check BP twice daily for the next 2 weeks and bring log to future clinic appointments.  Will schedule follow-up in pharmacy hypertension clinic in 2 weeks  Hyperlipidemia: On rosuvastatin 20 mg daily.  LDL 179 on 06/24/2019.  Will recheck lipid panel  Chest pain: Description suggest noncardiac chest pain, as describes for left-sided chest pain that lasts for few seconds and resolves.  No further cardiac work-up recommended at this time  RTC in 2 months  Medication Adjustments/Labs and Tests Ordered: Current medicines are reviewed at length with the patient today.  Concerns regarding medicines are outlined above.  Orders Placed This Encounter   Procedures  . Comprehensive metabolic panel  . CBC  . Lipid panel  . TSH  . Hemoglobin A1c  . EKG 12-Lead   Meds ordered this encounter  Medications  . carvedilol (COREG) 6.25 MG tablet    Sig: Take 1 tablet (6.25 mg total) by mouth 2 (two) times daily with a meal.    Dispense:  180 tablet    Refill:  3  . chlorthalidone (HYGROTON) 25 MG tablet    Sig: Take 0.5 tablets (12.5 mg total) by mouth daily.    Dispense:  15 tablet    Refill:  1    STOP hctz and torsemide    Patient Instructions  Medication Instructions:  STOP HCTZ STOP torsemide   START chlorthalidone 12.5 mg (1/2 tablet) daily  *If you need a refill on your  cardiac medications before your next appointment, please call your pharmacy*   Lab Work: CMET, CBC, Lipid, TSH, HmgA1C  If you have labs (blood work) drawn today and your tests are completely normal, you will receive your results only by: Marland Kitchen MyChart Message (if you have MyChart) OR . A paper copy in the mail If you have any lab test that is abnormal or we need to change your treatment, we will call you to review the results.  Follow-Up: At [facility], you and your health needs are our priority.  As part of our continuing mission to provide you with exceptional heart care, we have created designated Provider Care Teams.  These Care Teams include your primary Cardiologist (physician) and Advanced Practice Providers (APPs -  Physician Assistants and Nurse Practitioners) who all work together to provide you with the care you need, when you need it.  We recommend signing up for the patient portal called "MyChart".  Sign up information is provided on this After Visit Summary.  MyChart is used to connect with patients for Virtual Visits (Telemedicine).  Patients are able to view lab/test results, encounter notes, upcoming appointments, etc.  Non-urgent messages can be sent to your provider as well.   To learn more about what you can do with MyChart, go to  [email or web address].    Your next appointment:   2 weeks with pharmacist-bring blood pressure log and blood pressure cuff.  Bring all medications.  2 months with Dr. Bjorn Pippin   Other Instructions Please check your blood pressure at home daily, write it down.  Call the office or send message via Mychart with the readings in 2 weeks for Dr. Bjorn Pippin to review.       Signed, Little Ishikawa, MD  04/05/2020 5:40 PM    [facility]

## 2020-04-05 ENCOUNTER — Encounter: Payer: Self-pay | Admitting: Cardiology

## 2020-04-05 ENCOUNTER — Other Ambulatory Visit: Payer: Self-pay

## 2020-04-05 ENCOUNTER — Ambulatory Visit (INDEPENDENT_AMBULATORY_CARE_PROVIDER_SITE_OTHER): Payer: Self-pay | Admitting: Cardiology

## 2020-04-05 VITALS — BP 160/70 | HR 65 | Ht 64.0 in | Wt 159.0 lb

## 2020-04-05 DIAGNOSIS — Z79899 Other long term (current) drug therapy: Secondary | ICD-10-CM

## 2020-04-05 DIAGNOSIS — E782 Mixed hyperlipidemia: Secondary | ICD-10-CM

## 2020-04-05 DIAGNOSIS — I10 Essential (primary) hypertension: Secondary | ICD-10-CM

## 2020-04-05 DIAGNOSIS — R079 Chest pain, unspecified: Secondary | ICD-10-CM

## 2020-04-05 MED ORDER — CARVEDILOL 6.25 MG PO TABS: 6.2500 mg | ORAL_TABLET | Freq: Two times a day (BID) | ORAL | 3 refills | Status: DC

## 2020-04-05 MED ORDER — CHLORTHALIDONE 25 MG PO TABS: 12.5000 mg | ORAL_TABLET | Freq: Every day | ORAL | 1 refills | Status: DC

## 2020-04-05 NOTE — Patient Instructions (Signed)
Medication Instructions:  STOP HCTZ STOP torsemide   START chlorthalidone 12.5 mg (1/2 tablet) daily  *If you need a refill on your cardiac medications before your next appointment, please call your pharmacy*   Lab Work: CMET, CBC, Lipid, TSH, HmgA1C  If you have labs (blood work) drawn today and your tests are completely normal, you will receive your results only by: Marland Kitchen MyChart Message (if you have MyChart) OR . A paper copy in the mail If you have any lab test that is abnormal or we need to change your treatment, we will call you to review the results.  Follow-Up: At [facility], you and your health needs are our priority.  As part of our continuing mission to provide you with exceptional heart care, we have created designated Provider Care Teams.  These Care Teams include your primary Cardiologist (physician) and Advanced Practice Providers (APPs -  Physician Assistants and Nurse Practitioners) who all work together to provide you with the care you need, when you need it.  We recommend signing up for the patient portal called "MyChart".  Sign up information is provided on this After Visit Summary.  MyChart is used to connect with patients for Virtual Visits (Telemedicine).  Patients are able to view lab/test results, encounter notes, upcoming appointments, etc.  Non-urgent messages can be sent to your provider as well.   To learn more about what you can do with MyChart, go to [email or web address].    Your next appointment:   2 weeks with pharmacist-bring blood pressure log and blood pressure cuff.  Bring all medications.  2 months with Dr. Bjorn Pippin   Other Instructions Please check your blood pressure at home daily, write it down.  Call the office or send message via Mychart with the readings in 2 weeks for Dr. Bjorn Pippin to review.

## 2020-04-06 LAB — COMPREHENSIVE METABOLIC PANEL
ALT: 14 IU/L (ref 0–32)
AST: 22 IU/L (ref 0–40)
Albumin/Globulin Ratio: 1.5 (ref 1.2–2.2)
Albumin: 4.3 g/dL (ref 3.8–4.9)
Alkaline Phosphatase: 90 IU/L (ref 44–121)
BUN/Creatinine Ratio: 27 — ABNORMAL HIGH (ref 9–23)
BUN: 22 mg/dL (ref 6–24)
Bilirubin Total: 0.4 mg/dL (ref 0.0–1.2)
CO2: 22 mmol/L (ref 20–29)
Calcium: 9.2 mg/dL (ref 8.7–10.2)
Chloride: 103 mmol/L (ref 96–106)
Creatinine, Ser: 0.81 mg/dL (ref 0.57–1.00)
GFR calc Af Amer: 93 mL/min/{1.73_m2} (ref >59)
GFR calc non Af Amer: 80 mL/min/{1.73_m2} (ref >59)
Globulin, Total: 2.9 g/dL (ref 1.5–4.5)
Glucose: 153 mg/dL — ABNORMAL HIGH (ref 65–99)
Potassium: 4.3 mmol/L (ref 3.5–5.2)
Sodium: 138 mmol/L (ref 134–144)
Total Protein: 7.2 g/dL (ref 6.0–8.5)

## 2020-04-06 LAB — LIPID PANEL
Chol/HDL Ratio: 3.3 ratio (ref 0.0–4.4)
Cholesterol, Total: 261 mg/dL — ABNORMAL HIGH (ref 100–199)
HDL: 78 mg/dL (ref >39)
LDL Chol Calc (NIH): 169 mg/dL — ABNORMAL HIGH (ref 0–99)
Triglycerides: 86 mg/dL (ref 0–149)
VLDL Cholesterol Cal: 14 mg/dL (ref 5–40)

## 2020-04-06 LAB — TSH: TSH: 0.855 u[IU]/mL (ref 0.450–4.500)

## 2020-04-06 LAB — CBC
Hematocrit: 39.9 % (ref 34.0–46.6)
Hemoglobin: 12.9 g/dL (ref 11.1–15.9)
MCH: 29.2 pg (ref 26.6–33.0)
MCHC: 32.3 g/dL (ref 31.5–35.7)
MCV: 90 fL (ref 79–97)
Platelets: 295 10*3/uL (ref 150–450)
RBC: 4.42 x10E6/uL (ref 3.77–5.28)
RDW: 12.4 % (ref 11.7–15.4)
WBC: 6.8 10*3/uL (ref 3.4–10.8)

## 2020-04-06 LAB — HEMOGLOBIN A1C
Est. average glucose Bld gHb Est-mCnc: 123 mg/dL
Hgb A1c MFr Bld: 5.9 % — ABNORMAL HIGH (ref 4.8–5.6)

## 2020-04-08 ENCOUNTER — Ambulatory Visit (INDEPENDENT_AMBULATORY_CARE_PROVIDER_SITE_OTHER): Payer: Self-pay | Admitting: Licensed Clinical Social Worker

## 2020-04-08 DIAGNOSIS — F411 Generalized anxiety disorder: Secondary | ICD-10-CM

## 2020-04-15 ENCOUNTER — Ambulatory Visit: Payer: Self-pay | Admitting: Licensed Clinical Social Worker

## 2020-04-16 ENCOUNTER — Other Ambulatory Visit: Payer: Self-pay | Admitting: *Deleted

## 2020-04-16 ENCOUNTER — Telehealth: Payer: Self-pay | Admitting: Cardiology

## 2020-04-16 DIAGNOSIS — Z79899 Other long term (current) drug therapy: Secondary | ICD-10-CM

## 2020-04-16 DIAGNOSIS — I10 Essential (primary) hypertension: Secondary | ICD-10-CM

## 2020-04-16 MED ORDER — CARVEDILOL 6.25 MG PO TABS: 12.5000 mg | ORAL_TABLET | Freq: Two times a day (BID) | ORAL | 3 refills | Status: DC

## 2020-04-16 NOTE — Telephone Encounter (Signed)
Recommend increasing coreg to 12.5 mg BID.  Continue to check BP twice daily, has appointment with pharmacy clinic next week.

## 2020-04-16 NOTE — Telephone Encounter (Signed)
Pt c/o BP issue: STAT if pt c/o blurred vision, one-sided weakness or slurred speech  1. What are your last 5 BP readings?  04/15/20: 176/83 04/16/20: 183/90  2. Are you having any other symptoms (ex. Dizziness, headache, blurred vision, passed out)? "all of it" per Daughter   3. What is your BP issue? Patient's BP still spikes at times.  Daughter called. Patient is concerned because she has been on BP medication but her BP still spikes at times. Daughter is not sure what to do but wants [place] to reach out to the patient

## 2020-04-16 NOTE — Telephone Encounter (Signed)
Patient aware and verbalized understanding, lab results also discussed and aware will repeat labs at appt with pharmacist on 2/7

## 2020-04-16 NOTE — Telephone Encounter (Signed)
Spoke with patient. Patient's blood pressure continues to be elevated after medication adjustments. She has pulsating headaches and doesn't feel good. She takes her medication at 05:30am and 8pm. Yesterday she took it at 5:30am and 3pm because it was high and she felt awful.   1/31: 179/93p, 142/83a 2/1: 181/115p, 170/97a 2/2: 172/98p, 170/97a 2/3: 183/115p, 173/98a 2/4: 178/103, HR 75 this morning  She reports no chest pain but gas and burping. No numbness or tingling. No blurred vision. No dizziness.   Will route to [facility] pharm D and Dr. Bjorn Pippin for review.

## 2020-04-19 ENCOUNTER — Other Ambulatory Visit: Payer: Self-pay

## 2020-04-19 ENCOUNTER — Ambulatory Visit: Payer: Self-pay

## 2020-04-19 NOTE — BH Specialist Note (Signed)
Call placed to patient. Patient reports an increase in anxiety symptoms triggered by ongoing conflict with leadership at work. Pt endorses difficulty managing blood pressure, as a result from continued stress.   Pt is currently utilizing television and taking prescribed medications as coping skills. She reports that she would like to discuss additional stress management strategies at a later date, now is not a good time, per pt. Follow up appt scheduled for 04/15/20.

## 2020-04-22 ENCOUNTER — Other Ambulatory Visit: Payer: Self-pay

## 2020-04-22 ENCOUNTER — Ambulatory Visit: Payer: Self-pay

## 2020-04-22 NOTE — Progress Notes (Deleted)
Patient ID: Wendy Jensen                 DOB: Jul 20, 1960                      MRN: [id number]     HPI: Wendy Jensen is a 60 y.o. female referred by Dr. Bjorn Pippin to HTN clinic.  Current HTN meds:  Carvedilol 6.25mg  twice daily Chlorthalidone 12.5mg  daily  Previously tried:  Amlodipine  Hydralazine HCTZ Lisinopril Losartan Metoprolol   BP goal: 130/80  Family History:   Social History:   Diet:   Exercise:   Home BP readings:   Wt Readings from Last 3 Encounters:  04/05/20 159 lb (72.1 kg)  03/16/20 152 lb 9.6 oz (69.2 kg)  03/03/20 157 lb (71.2 kg)   BP Readings from Last 3 Encounters:  04/05/20 (!) 160/70  03/16/20 (!) 148/97  03/03/20 (!) 171/97   Pulse Readings from Last 3 Encounters:  04/05/20 65  03/16/20 67  03/03/20 73    Renal function: CrCl cannot be calculated (Unknown ideal weight.).  Past Medical History:  Diagnosis Date  . GERD (gastroesophageal reflux disease)   . Hypertension   . Peptic ulcer   . Reflux     Current Outpatient Medications on File Prior to Visit  Medication Sig Dispense Refill  . carvedilol (COREG) 6.25 MG tablet Take 2 tablets (12.5 mg total) by mouth 2 (two) times daily with a meal. 180 tablet 3  . chlorthalidone (HYGROTON) 25 MG tablet Take 0.5 tablets (12.5 mg total) by mouth daily. 15 tablet 1  . hydrOXYzine (ATARAX/VISTARIL) 10 MG tablet Take 1 tablet (10 mg total) by mouth 3 (three) times daily as needed. 30 tablet 0  . rosuvastatin (CRESTOR) 20 MG tablet Take 1 tablet (20 mg total) by mouth daily. 90 tablet 0  . sertraline (ZOLOFT) 50 MG tablet Take 1 tablet (50 mg total) by mouth daily. 30 tablet 3   No current facility-administered medications on file prior to visit.    Allergies  Allergen Reactions  . Amlodipine Benzoate Other (See Comments)    Patient reports this made her feel sick.  . Amlodipine Besylate     Patient reports this makes her feel sick.  . Hydralazine Other (See Comments)  .  Hydrochlorothiazide Other (See Comments)  . Lisinopril Other (See Comments)    Patient reports this made her feel sick.  . Losartan Other (See Comments)  . Metoprolol Tartrate     Patient reports this makes her feel sick.    There were no vitals taken for this visit.  No problem-specific Assessment & Plan notes found for this encounter.    Jenney Brester Rodriguez-Guzman PharmD, BCPS, CPP [facility] [address] [place] [address] 04/22/2020 1:56 PM

## 2020-05-04 ENCOUNTER — Other Ambulatory Visit: Payer: Self-pay

## 2020-05-04 ENCOUNTER — Ambulatory Visit (INDEPENDENT_AMBULATORY_CARE_PROVIDER_SITE_OTHER): Payer: Self-pay | Admitting: Pharmacist Clinician (PhC)/ Clinical Pharmacy Specialist

## 2020-05-04 DIAGNOSIS — I10 Essential (primary) hypertension: Secondary | ICD-10-CM

## 2020-05-04 MED ORDER — CHLORTHALIDONE 25 MG PO TABS: 25.0000 mg | ORAL_TABLET | Freq: Every day | ORAL | 3 refills | Status: DC

## 2020-05-04 MED ORDER — CHLORTHALIDONE 25 MG PO TABS: 12.5000 mg | ORAL_TABLET | Freq: Every day | ORAL | 1 refills | Status: DC

## 2020-05-04 MED ORDER — CARVEDILOL 12.5 MG PO TABS: 12.5000 mg | ORAL_TABLET | Freq: Two times a day (BID) | ORAL | 3 refills | Status: DC

## 2020-05-04 NOTE — Progress Notes (Signed)
05/04/2020 Wendy Jensen [birth year] [id number]   HPI:  Wendy Jensen is a 60 y.o. female patient of Dr Bjorn Pippin, with a PMH below who presents today for hypertension clinic evaluation.  When she saw Dr. Bjorn Pippin last month her pressure was noted to be 160/70  He added chlorthalidone 12.5 mg daily and increased the carvedilol to 6.25 mg bid.  About 2 weeks later she called to report her home readings were still elevated 170-190 systolic.  She was asked to increase the carvedilol further to 12.5 mg twice daily.    Today she returns for BP evaluation.  She notes that she did not take her medications last night, as she was "celebrating" with some friends and didn't think she could take her meds with the 4 beers she had.  She takes all her medications at night and admits to not getting the second dose of carvedilol in on a regular basis.  She has some ongoing problems with anxiety, and believes her pressure goes up due to this.  Because of that, she has not checked her BP since increasing the carvedilol dose.  She uses sertraline prn anxiety attacks rather than daily, and notes she hasn't had to use any for several weeks.  She also admits that after taking her medications she tends to sit and wonder what type of side effects can happen.  She realizes that this is a form of anxiety and is trying to get better about it.     Past Medical History: hyperlipidemia 1/22 LDL 169 - on rosuvastatin 20 (compliance?)  anxiety Using sertraline prn not daily     Blood Pressure Goal:  130/80  Current Medications: carvedilol 12.5 bid, chlorthalidone 12.5 mg qd    Family Hx: older sister and one nephew have hypertension; mother passed last year, was 74; father living at 63 no cardiac issues; daughter (84) with hypertension,  2 kids 32,31 no issues yet;   Social Hx: no, 2 beers per day most days; no regular caffeine  Diet: avoids salt; only occasionally with cooking, not at table; breakfast -  applesauce/banana, wheat toast; protein is mostly chicken, [place], fish; rare beef, no pork; some veggies, likes soy burgers;   Exercise: no regular exercise, but on feet at work all day; doesn't walk due to fear of dogs  Home BP readings: 170-190/90-115 about 2 weeks ago, no home readings since increased carvedilol.    Intolerances: amlodipine, hydralazine, hctz, lisinopril, losartan, metoprolol (they all make her feel sick)  Labs: 04/05/20:  Na 138, K 4.3, Glu 153, BUN 22, SCr 0.81 GFR 93, TSH 0.855; A1c 5.9   Wt Readings from Last 3 Encounters:  05/04/20 163 lb (73.9 kg)  04/05/20 159 lb (72.1 kg)  03/16/20 152 lb 9.6 oz (69.2 kg)   BP Readings from Last 3 Encounters:  05/04/20 (!) 160/90  04/05/20 (!) 160/70  03/16/20 (!) 148/97   Pulse Readings from Last 3 Encounters:  05/04/20 63  04/05/20 65  03/16/20 67    Current Outpatient Medications  Medication Sig Dispense Refill  . carvedilol (COREG) 12.5 MG tablet Take 1 tablet (12.5 mg total) by mouth 2 (two) times daily. 180 tablet 3  . chlorthalidone (HYGROTON) 25 MG tablet Take 1 tablet (25 mg total) by mouth daily. 90 tablet 3  . rosuvastatin (CRESTOR) 20 MG tablet Take 1 tablet (20 mg total) by mouth daily. 90 tablet 0  . sertraline (ZOLOFT) 50 MG tablet Take 1 tablet (50 mg total) by mouth daily.  30 tablet 3   No current facility-administered medications for this visit.    Allergies  Allergen Reactions  . Amlodipine Benzoate Other (See Comments)    Patient reports this made her feel sick.  . Amlodipine Besylate     Patient reports this makes her feel sick.  . Hydralazine Other (See Comments)  . Hydrochlorothiazide Other (See Comments)  . Lisinopril Other (See Comments)    Patient reports this made her feel sick.  . Losartan Other (See Comments)  . Metoprolol Tartrate     Patient reports this makes her feel sick.    Past Medical History:  Diagnosis Date  . GERD (gastroesophageal reflux disease)   .  Hypertension   . Peptic ulcer   . Reflux     Blood pressure (!) 160/90, pulse 63, resp. rate 15, height 5\' 4"  (1.626 m), weight 163 lb (73.9 kg), SpO2 97 %.  Hypertension Patient with essential hypertension as well as some concern for compliance.  We discussed the need to take carvedilol doses 12 hours apart to maximize effectiveness.  Also stressed that she should take her medications, even if she drinks more than 1-2 beers.  Since having hypertension causes her some measure of anxiety, we discussed the need to take her meds regularly and thus control the pressure.   We increased her chlorthalidone to 25 mg daily and stressed twice daily carvedilol.  I asked that she not check her blood pressure for the next 4 weeks, hopefully we can get her taking her meds and not stressing about why they might not be working completely.  Next month when she returns she should bring her home cuff and we will compare it to office readings. At that time we will work with her on proper BP technique and will have her monitor at home occasionally after that.    PharmD CPP [facility] [address] [place], [place] [phone]

## 2020-05-04 NOTE — Patient Instructions (Addendum)
Return for a a follow up appointment March 22  Go to the lab in the first week of March to check kidney function  Take your BP meds as follows:  AM: carvedilol 12.5 mg, chlorthalidone 25 mg (whole tablet)  PM: carvedilol 12.5 mg   Bring all of your meds, your BP cuff and your record of home blood pressures to your next appointment.  Exercise as you're able, try to walk approximately 30 minutes per day.  Keep salt intake to a minimum, especially watch canned and prepared boxed foods.  Eat more fresh fruits and vegetables and fewer canned items.  Avoid eating in fast food restaurants.

## 2020-05-04 NOTE — Assessment & Plan Note (Signed)
Patient with essential hypertension as well as some concern for compliance.  We discussed the need to take carvedilol doses 12 hours apart to maximize effectiveness.  Also stressed that she should take her medications, even if she drinks more than 1-2 beers.  Since having hypertension causes her some measure of anxiety, we discussed the need to take her meds regularly and thus control the pressure.   We increased her chlorthalidone to 25 mg daily and stressed twice daily carvedilol.  I asked that she not check her blood pressure for the next 4 weeks, hopefully we can get her taking her meds and not stressing about why they might not be working completely.  Next month when she returns she should bring her home cuff and we will compare it to office readings. At that time we will work with her on proper BP technique and will have her monitor at home occasionally after that.

## 2020-05-10 ENCOUNTER — Other Ambulatory Visit: Payer: Self-pay | Admitting: Physician Assistant

## 2020-05-10 DIAGNOSIS — E7800 Pure hypercholesterolemia, unspecified: Secondary | ICD-10-CM

## 2020-05-21 ENCOUNTER — Other Ambulatory Visit: Payer: Self-pay

## 2020-05-21 DIAGNOSIS — I10 Essential (primary) hypertension: Secondary | ICD-10-CM

## 2020-05-21 DIAGNOSIS — Z79899 Other long term (current) drug therapy: Secondary | ICD-10-CM

## 2020-05-22 LAB — BASIC METABOLIC PANEL
BUN/Creatinine Ratio: 26 — ABNORMAL HIGH (ref 9–23)
BUN: 19 mg/dL (ref 6–24)
CO2: 28 mmol/L (ref 20–29)
Calcium: 9.5 mg/dL (ref 8.7–10.2)
Chloride: 97 mmol/L (ref 96–106)
Creatinine, Ser: 0.73 mg/dL (ref 0.57–1.00)
Glucose: 91 mg/dL (ref 65–99)
Potassium: 3.6 mmol/L (ref 3.5–5.2)
Sodium: 140 mmol/L (ref 134–144)
eGFR: 95 mL/min/{1.73_m2} (ref >59)

## 2020-05-22 LAB — MAGNESIUM: Magnesium: 2.3 mg/dL (ref 1.6–2.3)

## 2020-05-25 ENCOUNTER — Other Ambulatory Visit: Payer: Self-pay | Admitting: Internal Medicine

## 2020-05-25 DIAGNOSIS — Z1231 Encounter for screening mammogram for malignant neoplasm of breast: Secondary | ICD-10-CM

## 2020-06-01 ENCOUNTER — Ambulatory Visit: Payer: Self-pay

## 2020-06-01 NOTE — Progress Notes (Deleted)
HPI:  Wendy Jensen is a 60 y.o. female patient of Dr Bjorn Pippin, with a PMH below who presents today for hypertension clinic evaluation.  When she saw Dr. Bjorn Pippin last month her pressure was noted to be 160/70  He added chlorthalidone 12.5 mg daily and increased the carvedilol to 6.25 mg bid.  About 2 weeks later she called to report her home readings were still elevated 170-190 systolic.  She was asked to increase the carvedilol further to 12.5 mg twice daily.    Today she returns for BP evaluation.  She notes that she did not take her medications last night, as she was "celebrating" with some friends and didn't think she could take her meds with the 4 beers she had.  She takes all her medications at night and admits to not getting the second dose of carvedilol in on a regular basis.  She has some ongoing problems with anxiety, and believes her pressure goes up due to this.  Because of that, she has not checked her BP since increasing the carvedilol dose.  She uses sertraline prn anxiety attacks rather than daily, and notes she hasn't had to use any for several weeks.  She also admits that after taking her medications she tends to sit and wonder what type of side effects can happen.  She realizes that this is a form of anxiety and is trying to get better about it.     Past Medical History: hyperlipidemia 1/22 LDL 169 - on rosuvastatin 20 (compliance?)  anxiety Using sertraline prn not daily     Blood Pressure Goal:  130/80  Current Medications:  carvedilol 12.5 bid, chlorthalidone 25 mg qd    Family Hx: older sister and one nephew have hypertension; mother passed last year, was 79; father living at 27 no cardiac issues; daughter (31) with hypertension,  2 kids 32,31 no issues yet;   Social Hx: no, 2 beers per day most days; no regular caffeine  Diet: avoids salt; only occasionally with cooking, not at table; breakfast - applesauce/banana, wheat toast; protein is mostly chicken,  [place], fish; rare beef, no pork; some veggies, likes soy burgers;   Exercise: no regular exercise, but on feet at work all day; doesn't walk due to fear of dogs  Home BP readings: 170-190/90-115 about 2 weeks ago, no home readings since increased carvedilol.    Intolerances: amlodipine, hydralazine, hctz, lisinopril, losartan, metoprolol (they all make her feel sick)  Labs: 04/05/20:  Na 138, K 4.3, Glu 153, BUN 22, SCr 0.81 GFR 93, TSH 0.855; A1c 5.9   Wt Readings from Last 3 Encounters:  05/04/20 163 lb (73.9 kg)  04/05/20 159 lb (72.1 kg)  03/16/20 152 lb 9.6 oz (69.2 kg)   BP Readings from Last 3 Encounters:  05/04/20 (!) 160/90  04/05/20 (!) 160/70  03/16/20 (!) 148/97   Pulse Readings from Last 3 Encounters:  05/04/20 63  04/05/20 65  03/16/20 67    Current Outpatient Medications  Medication Sig Dispense Refill  . carvedilol (COREG) 12.5 MG tablet Take 1 tablet (12.5 mg total) by mouth 2 (two) times daily. 180 tablet 3  . chlorthalidone (HYGROTON) 25 MG tablet Take 1 tablet (25 mg total) by mouth daily. 90 tablet 3  . rosuvastatin (CRESTOR) 20 MG tablet TAKE 1 TABLET(20 MG) BY MOUTH DAILY 90 tablet 0  . sertraline (ZOLOFT) 50 MG tablet Take 1 tablet (50 mg total) by mouth daily. 30 tablet 3   No current facility-administered medications  for this visit.    Allergies  Allergen Reactions  . Amlodipine Benzoate Other (See Comments)    Patient reports this made her feel sick.  . Amlodipine Besylate     Patient reports this makes her feel sick.  . Hydralazine Other (See Comments)  . Hydrochlorothiazide Other (See Comments)  . Lisinopril Other (See Comments)    Patient reports this made her feel sick.  . Losartan Other (See Comments)  . Metoprolol Tartrate     Patient reports this makes her feel sick.    Past Medical History:  Diagnosis Date  . GERD (gastroesophageal reflux disease)   . Hypertension   . Peptic ulcer   . Reflux     There were no vitals taken  for this visit.  No problem-specific Assessment & Plan notes found for this encounter.  Raquel Rodriguez-Guzman PharmD, BCPS, CPP [facility] [address] [place] [address] 06/01/2020 3:21 PM

## 2020-06-04 ENCOUNTER — Other Ambulatory Visit: Payer: Self-pay

## 2020-06-04 ENCOUNTER — Ambulatory Visit (INDEPENDENT_AMBULATORY_CARE_PROVIDER_SITE_OTHER): Payer: Self-pay | Admitting: Cardiology

## 2020-06-04 VITALS — BP 146/80 | HR 65 | Ht 64.0 in | Wt 155.4 lb

## 2020-06-04 DIAGNOSIS — E782 Mixed hyperlipidemia: Secondary | ICD-10-CM

## 2020-06-04 DIAGNOSIS — R079 Chest pain, unspecified: Secondary | ICD-10-CM

## 2020-06-04 DIAGNOSIS — I10 Essential (primary) hypertension: Secondary | ICD-10-CM

## 2020-06-04 MED ORDER — CARVEDILOL 25 MG PO TABS: 25.0000 mg | ORAL_TABLET | Freq: Two times a day (BID) | ORAL | 3 refills | Status: DC

## 2020-06-04 NOTE — Progress Notes (Signed)
Cardiology Office Note:    Date:  06/06/2020   ID:  Wendy Jensen, DOB 23-Nov-1960, MRN [id number]  PCP:  Arvilla Market, DO  Cardiologist:  No primary care provider on file.  Electrophysiologist:  None   Referring MD: Leary Roca*   Chief Complaint  Patient presents with  . Hypertension    History of Present Illness:    Wendy Jensen is a 60 y.o. female with a hx of hypertension, PUD, hyperlipidemia who presents for follow-up.  She was referred by Dr. Earlene Plater for evaluation of hypertension, initially seen on 04/05/2020.  She has history of allergy to amlodipine, hydralazine, lisinopril, losartan, and metoprolol.  HCTZ is also listed in allergies, but reports she tolerates OK.  States that she currently is only taking carvedilol 3.125 mg p.o. BID, but sometimes will take the 6.25 mg dose if her BP is elevated.  She is not taking hydrochlorothiazide and takes torsemide 5 mg only about 4 days/week.  She denies any shortness of breath.  Reports occasional chest pain that she describes as left-sided pain that last for few seconds and resolves.  Reports occasional lightheadedness.  Denies any syncope.  No lower extremity edema.  Quit smoking last year.  No known history of heart disease in her immediate family.  At initial clinic visit, BP meds were changed to carvedilol and chlorthalidone.  She reports she is feeling significantly improved.  Reports some indigestion but otherwise denies any chest pain.  Denies any dyspnea.  Reports some lightheadedness in the morning before she eats breakfast.  Denies any lower extremity edema.  States that she has been compliant with her BP meds but is only taking her statin 3 to 4 days a week.   Past Medical History:  Diagnosis Date  . GERD (gastroesophageal reflux disease)   . Hypertension   . Peptic ulcer   . Reflux     Past Surgical History:  Procedure Laterality Date  . COLONOSCOPY    . ESOPHAGEAL DILATION    .  TUBAL LIGATION    . UPPER GI ENDOSCOPY      Current Medications: Current Meds  Medication Sig  . chlorthalidone (HYGROTON) 25 MG tablet Take 1 tablet (25 mg total) by mouth daily.  . rosuvastatin (CRESTOR) 20 MG tablet TAKE 1 TABLET(20 MG) BY MOUTH DAILY  . sertraline (ZOLOFT) 50 MG tablet Take 1 tablet (50 mg total) by mouth daily.  . [DISCONTINUED] carvedilol (COREG) 12.5 MG tablet Take 1 tablet (12.5 mg total) by mouth 2 (two) times daily.     Allergies:   Amlodipine benzoate, Amlodipine besylate, Hydralazine, Hydrochlorothiazide, Lisinopril, Losartan, and Metoprolol tartrate   Social History   Socioeconomic History  . Marital status: Single    Spouse name: Not on file  . Number of children: Not on file  . Years of education: Not on file  . Highest education level: Not on file  Occupational History  . Not on file  Tobacco Use  . Smoking status: Former Smoker    Packs/day: 0.15    Types: Cigarettes    Quit date: 09/10/2018    Years since quitting: 1.7  . Smokeless tobacco: Never Used  Vaping Use  . Vaping Use: Never used  Substance and Sexual Activity  . Alcohol use: Yes    Comment: 1 glass beer per day  . Drug use: No  . Sexual activity: Not Currently  Other Topics Concern  . Not on file  Social History Narrative  . Not on file  Social Determinants of Health   Financial Resource Strain: Not on file  Food Insecurity: Not on file  Transportation Needs: Not on file  Physical Activity: Not on file  Stress: Not on file  Social Connections: Not on file     Family History: The patient's family history includes Hypertension in her father.  ROS:   Please see the history of present illness.     All other systems reviewed and are negative.  EKGs/Labs/Other Studies Reviewed:    The following studies were reviewed today:   EKG:  EKG is ordered today.  The ekg ordered today demonstrates sinus rhythm, rate 65, first-degree AV block, nonspecific T wave flattening,  QTC 418  Recent Labs: 04/05/2020: ALT 14; Hemoglobin 12.9; Platelets 295; TSH 0.855 05/21/2020: BUN 19; Creatinine, Ser 0.73; Magnesium 2.3; Potassium 3.6; Sodium 140  Recent Lipid Panel    Component Value Date/Time   CHOL 261 (H) 04/05/2020 1542   TRIG 86 04/05/2020 1542   HDL 78 04/05/2020 1542   CHOLHDL 3.3 04/05/2020 1542   LDLCALC 169 (H) 04/05/2020 1542   LDLDIRECT 179 (H) 06/24/2019 1412    Physical Exam:    VS:  BP (!) 146/80   Pulse 65   Ht 5\' 4"  (1.626 m)   Wt 155 lb 6.4 oz (70.5 kg)   SpO2 99%   BMI 26.67 kg/m     Wt Readings from Last 3 Encounters:  06/04/20 155 lb 6.4 oz (70.5 kg)  05/04/20 163 lb (73.9 kg)  04/05/20 159 lb (72.1 kg)     GEN:  in no acute distress HEENT: Normal NECK: No JVD; No carotid bruits LYMPHATICS: No lymphadenopathy CARDIAC: RRR, no murmurs, rubs, gallops RESPIRATORY:  Clear to auscultation without rales, wheezing or rhonchi  ABDOMEN: Soft, non-tender, non-distended MUSCULOSKELETAL:  No edema; No deformity  SKIN: Warm and dry NEUROLOGIC:  Alert and oriented x 3 PSYCHIATRIC:  Normal affect   ASSESSMENT:    1. Essential hypertension   2. Mixed hyperlipidemia   3. Chest pain of uncertain etiology    PLAN:    Hypertension: Allergies to multiple antihypertensives.  Currently on carvedilol 12.5 mg twice daily and chlorthalidone 25 mg daily. -BP improved but remains mildly elevated, will increase carvedilol to 25 mg twice daily  Hyperlipidemia: On rosuvastatin 20 mg daily.  LDL 169 on 04/05/2020.  She reports she has not been taking her statin, encouraged compliance.  Plan to recheck lipid panel once taking statin daily x2 to 3 months  Chest pain: Description suggest noncardiac chest pain, as describes for left-sided chest pain that lasts for few seconds and resolves.  No further cardiac work-up recommended at this time  RTC in 3 months  Medication Adjustments/Labs and Tests Ordered: Current medicines are reviewed at length with  the patient today.  Concerns regarding medicines are outlined above.  Orders Placed This Encounter  Procedures  . EKG 12-Lead   Meds ordered this encounter  Medications  . carvedilol (COREG) 25 MG tablet    Sig: Take 1 tablet (25 mg total) by mouth 2 (two) times daily.    Dispense:  180 tablet    Refill:  3    Dose increase    Patient Instructions  Medication Instructions:  INCREASE carvedilol (Coreg) to 25 mg two times daily  *If you need a refill on your cardiac medications before your next appointment, please call your pharmacy*   Follow-Up: At [facility], you and your health needs are our priority.  As part of  our continuing mission to provide you with exceptional heart care, we have created designated Provider Care Teams.  These Care Teams include your primary Cardiologist (physician) and Advanced Practice Providers (APPs -  Physician Assistants and Nurse Practitioners) who all work together to provide you with the care you need, when you need it.  We recommend signing up for the patient portal called "MyChart".  Sign up information is provided on this After Visit Summary.  MyChart is used to connect with patients for Virtual Visits (Telemedicine).  Patients are able to view lab/test results, encounter notes, upcoming appointments, etc.  Non-urgent messages can be sent to your provider as well.   To learn more about what you can do with MyChart, go to [email or web address].    Your next appointment:   3 month(s)  The format for your next appointment:   In Person  Provider:   Epifanio Lesches, MD      Signed, Wendy Ishikawa, MD  06/06/2020 2:03 PM    [facility]

## 2020-06-04 NOTE — Patient Instructions (Signed)
Medication Instructions:  INCREASE carvedilol (Coreg) to 25 mg two times daily  *If you need a refill on your cardiac medications before your next appointment, please call your pharmacy*   Follow-Up: At [facility], you and your health needs are our priority.  As part of our continuing mission to provide you with exceptional heart care, we have created designated Provider Care Teams.  These Care Teams include your primary Cardiologist (physician) and Advanced Practice Providers (APPs -  Physician Assistants and Nurse Practitioners) who all work together to provide you with the care you need, when you need it.  We recommend signing up for the patient portal called "MyChart".  Sign up information is provided on this After Visit Summary.  MyChart is used to connect with patients for Virtual Visits (Telemedicine).  Patients are able to view lab/test results, encounter notes, upcoming appointments, etc.  Non-urgent messages can be sent to your provider as well.   To learn more about what you can do with MyChart, go to [email or web address].    Your next appointment:   3 month(s)  The format for your next appointment:   In Person  Provider:   Epifanio Lesches, MD

## 2020-06-07 ENCOUNTER — Telehealth: Payer: Self-pay | Admitting: Cardiology

## 2020-06-07 ENCOUNTER — Telehealth: Payer: Self-pay | Admitting: Internal Medicine

## 2020-06-07 NOTE — Telephone Encounter (Signed)
Spoke with patient to schedule her  3 month follow up with Dr. Andee Poles is scheduled for Monday 08/30/20 at 3:20 pm.  Will mail information to patient and she voiced her understanding.Marland Kitchen

## 2020-06-07 NOTE — Telephone Encounter (Signed)
New message    Very important to speak with MD regarding her health and work, will go into details when the nurse calls back   Call back around 2:00pm today

## 2020-06-08 NOTE — Telephone Encounter (Signed)
Left message on voicemail to return call.

## 2020-06-15 NOTE — Telephone Encounter (Signed)
Attempt to reach patient again.  Left message on voicemail to return call.  Advised on answering service to call at her convenience.

## 2020-06-17 ENCOUNTER — Telehealth: Payer: Self-pay | Admitting: Cardiology

## 2020-06-17 NOTE — Telephone Encounter (Signed)
Returned call to patient-patient reports she forgot to take her medication last night (coreg) and when she woke up this morning to go to work her BP was 176/96 HR 72.   She took her AM medication before going to work (coreg 25, chlorthalidone 25 mg).  She states the nurse checked her BP at work (930 AM) and it was 160/80 so she took coreg 12.5 mg around 10 AM.   She is wondering if her medications need to be changed.  She denies HA, blurred vision, CP, SOB.   She is home now but has not rechecked her BP.  She does not check this at home as it causes her to have anxiety.    Advised to start checking BP 2 times daily for 1 week and send readings via Mychart or call with readings for Dr. Bjorn Pippin to review.  Patient denies eating anything high in sodium, reports monitoring her intake.  Advised to continue to monitor sodium intake closely.   Also advised to take medication as prescribed, try to avoid missed doses.  Advised to check BP and HR tonight before evening dose.     Patient aware and verbalized understanding.  Routed to MD to review for further recommendations.

## 2020-06-17 NOTE — Telephone Encounter (Signed)
Patient was calling in regards to the carvedilol (COREG) 25 MG tablet she doesn't feel like its strong enough for her. Because she had to take more then what's prescribe to feel a better. Patients want to know what she needs to do.

## 2020-06-18 ENCOUNTER — Telehealth: Payer: Self-pay | Admitting: Internal Medicine

## 2020-06-18 NOTE — Telephone Encounter (Signed)
Agree with plan 

## 2020-06-18 NOTE — Telephone Encounter (Signed)
Attempt to call patient back.  Needing more information regarding the time off. Dates? BP readings? Taking medications?  Inform that PCP is not in the office. May need to go to [facility] or [facility] on next business day.

## 2020-06-18 NOTE — Telephone Encounter (Signed)
Pt called checking status of request. Pt states she has talked to her cardiologist & followed instruction to take carvedilol when she notices BP is elevated & to keep a BP log, which she started doing today. 06/17/20 BP 176/96 at 9:00 am. 06/18/20 BP 138/96 at 6:30 am & BP 160/80 at 9:30 am. Pt confirmed taking carvedilol, chlorthalidone, rosuvastatin. Pt states she does not take zoloft every day, only when she feels anxioous.  Pt informed to take daily. Pt states "that is what cardiologist said too". Pt decided not to ask off next week M-FR and to wait for PCP appt to discuss further what to do for her anxiety, BP, and stress.

## 2020-06-18 NOTE — Telephone Encounter (Signed)
Pt called wanting a note from her provider to be excused from work. Patient states having an increase in anxiety due to ongoing conflict with leadership at work. Pt also verbalized having difficulty managing blood pressure, as a result from continued stress. I informed the patient that she has an upcoming appointment on 07/07/20. Please follow up and advise pt on the next steps.

## 2020-07-01 ENCOUNTER — Ambulatory Visit: Payer: Self-pay

## 2020-07-07 ENCOUNTER — Ambulatory Visit (INDEPENDENT_AMBULATORY_CARE_PROVIDER_SITE_OTHER): Payer: BC Managed Care – PPO | Admitting: Internal Medicine

## 2020-07-07 ENCOUNTER — Encounter: Payer: Self-pay | Admitting: Internal Medicine

## 2020-07-07 ENCOUNTER — Other Ambulatory Visit: Payer: Self-pay

## 2020-07-07 VITALS — BP 156/90 | HR 82 | Resp 16 | Wt 157.8 lb

## 2020-07-07 DIAGNOSIS — K219 Gastro-esophageal reflux disease without esophagitis: Secondary | ICD-10-CM

## 2020-07-07 DIAGNOSIS — I10 Essential (primary) hypertension: Secondary | ICD-10-CM | POA: Diagnosis not present

## 2020-07-07 DIAGNOSIS — Z8719 Personal history of other diseases of the digestive system: Secondary | ICD-10-CM

## 2020-07-07 NOTE — Progress Notes (Signed)
  Subjective:    Wendy Jensen - 60 y.o. female MRN [id number]  Date of birth: Apr 16, 1960  HPI  Wendy Jensen is here for follow up. Patient reports she missed her BP medications the past couple days. She has otherwise been compliant. No chest pain, severe headaches, vision changes. Also her friend's daughter passed away last night and she has been upset by this.   Has a history of acid reflux. Says she has had endoscopy done twice. Has had esophagus stretches twice in 20 years. She saw GI last month and said she couldn't afford Rx due to it being $50. Says she wants to be evaluated for having it stretched again. Would like second opinion.      Health Maintenance:  Health Maintenance Due  Topic Date Due  . COVID-19 Vaccine (1) Never done    -  reports that she quit smoking about 21 months ago. Her smoking use included cigarettes. She smoked 0.15 packs per day. She has never used smokeless tobacco. - Review of Systems: Per HPI. - Past Medical History: Patient Active Problem List   Diagnosis Date Noted  . Aortic calcification (HCC) 06/25/2019  . Osteoarthritis of cervicothoracic spine 06/25/2019  . Epidermoid cyst 04/30/2018  . Mixed hyperlipidemia 12/10/2017  . Allergic rhinitis 07/31/2017  . Generalized anxiety disorder 07/31/2017  . Nicotine dependence 07/31/2017  . Hypertension 09/10/2008  . GERD 09/10/2008   - Medications: reviewed and updated   Objective:   Physical Exam BP (!) 156/90   Pulse 82   Resp 16   Wt 157 lb 12.8 oz (71.6 kg)   SpO2 98%   BMI 27.09 kg/m  Physical Exam Constitutional:      General: She is not in acute distress.    Appearance: She is not diaphoretic.  Cardiovascular:     Rate and Rhythm: Normal rate.  Pulmonary:     Effort: Pulmonary effort is normal. No respiratory distress.  Musculoskeletal:        General: Normal range of motion.  Skin:    General: Skin is warm and dry.  Neurological:     Mental Status: She is alert  and oriented to person, place, and time.  Psychiatric:        Mood and Affect: Affect normal.        Judgment: Judgment normal.            Assessment & Plan:   1. Primary hypertension BP is slightly above goal, likely due to recent missed doses. Asymptomatic. Continue current regimen.   2. Gastroesophageal reflux disease, unspecified whether esophagitis present 3. History of esophageal stricture Patient with h/o esophageal stricture, stretched x2. Would like second opinion. GI referral placed.  - Ambulatory referral to Gastroenterology  Marcy Siren, D.O. 07/07/2020, 2:42 PM Primary Care at [facility]

## 2020-07-07 NOTE — Progress Notes (Signed)
Pt states she has been having issues with her esophagus

## 2020-08-11 ENCOUNTER — Other Ambulatory Visit: Payer: Self-pay | Admitting: Gastroenterology

## 2020-08-11 DIAGNOSIS — R1310 Dysphagia, unspecified: Secondary | ICD-10-CM

## 2020-08-28 NOTE — Progress Notes (Deleted)
Cardiology Office Note:    Date:  08/28/2020   ID:  Wendy Jensen, DOB 1960/05/20, MRN [id number]  PCP:  Arvilla Market, MD  Cardiologist:  None  Electrophysiologist:  None   Referring MD: Leary Roca*   No chief complaint on file.   History of Present Illness:    Wendy Jensen is a 60 y.o. female with a hx of hypertension, PUD, hyperlipidemia who presents for follow-up.  She was referred by Dr. Earlene Plater for evaluation of hypertension, initially seen on 04/05/2020.  She has history of allergy to amlodipine, hydralazine, lisinopril, losartan, and metoprolol.  HCTZ is also listed in allergies, but reports she tolerates OK.  States that she currently is only taking carvedilol 3.125 mg p.o. BID, but sometimes will take the 6.25 mg dose if her BP is elevated.  She is not taking hydrochlorothiazide and takes torsemide 5 mg only about 4 days/week.  She denies any shortness of breath.  Reports occasional chest pain that she describes as left-sided pain that last for few seconds and resolves.  Reports occasional lightheadedness.  Denies any syncope.  No lower extremity edema.  Quit smoking last year.  No known history of heart disease in her immediate family.  At initial clinic visit on 04/05/2020, BP meds were changed to carvedilol and chlorthalidone.  She reports she is feeling significantly improved.  Reports some indigestion but otherwise denies any chest pain.  Denies any dyspnea.  Reports some lightheadedness in the morning before she eats breakfast.  Denies any lower extremity edema.  States that she has been compliant with her BP meds but is only taking her statin 3 to 4 days a week.  Since last clinic visit,   Past Medical History:  Diagnosis Date   GERD (gastroesophageal reflux disease)    Hypertension    Peptic ulcer    Reflux     Past Surgical History:  Procedure Laterality Date   COLONOSCOPY     ESOPHAGEAL DILATION     TUBAL LIGATION     UPPER  GI ENDOSCOPY      Current Medications: No outpatient medications have been marked as taking for the 08/30/20 encounter (Appointment) with Little Ishikawa, MD.     Allergies:   Amlodipine benzoate, Amlodipine besylate, Hydralazine, Hydrochlorothiazide, Lisinopril, Losartan, and Metoprolol tartrate   Social History   Socioeconomic History   Marital status: Single    Spouse name: Not on file   Number of children: Not on file   Years of education: Not on file   Highest education level: Not on file  Occupational History   Not on file  Tobacco Use   Smoking status: Former    Packs/day: 0.15    Pack years: 0.00    Types: Cigarettes    Quit date: 09/10/2018    Years since quitting: 1.9   Smokeless tobacco: Never  Vaping Use   Vaping Use: Never used  Substance and Sexual Activity   Alcohol use: Yes    Comment: 1 glass beer per day   Drug use: No   Sexual activity: Not Currently  Other Topics Concern   Not on file  Social History Narrative   Not on file   Social Determinants of Health   Financial Resource Strain: Not on file  Food Insecurity: Not on file  Transportation Needs: Not on file  Physical Activity: Not on file  Stress: Not on file  Social Connections: Not on file     Family History: The patient's family  history includes Hypertension in her father.  ROS:   Please see the history of present illness.     All other systems reviewed and are negative.  EKGs/Labs/Other Studies Reviewed:    The following studies were reviewed today:   EKG:  EKG is ordered today.  The ekg ordered today demonstrates sinus rhythm, rate 65, first-degree AV block, nonspecific T wave flattening, QTC 418  Recent Labs: 04/05/2020: ALT 14; Hemoglobin 12.9; Platelets 295; TSH 0.855 05/21/2020: BUN 19; Creatinine, Ser 0.73; Magnesium 2.3; Potassium 3.6; Sodium 140  Recent Lipid Panel    Component Value Date/Time   CHOL 261 (H) 04/05/2020 1542   TRIG 86 04/05/2020 1542   HDL 78  04/05/2020 1542   CHOLHDL 3.3 04/05/2020 1542   LDLCALC 169 (H) 04/05/2020 1542   LDLDIRECT 179 (H) 06/24/2019 1412    Physical Exam:    VS:  There were no vitals taken for this visit.    Wt Readings from Last 3 Encounters:  07/07/20 157 lb 12.8 oz (71.6 kg)  06/04/20 155 lb 6.4 oz (70.5 kg)  05/04/20 163 lb (73.9 kg)     GEN:  in no acute distress HEENT: Normal NECK: No JVD; No carotid bruits LYMPHATICS: No lymphadenopathy CARDIAC: RRR, no murmurs, rubs, gallops RESPIRATORY:  Clear to auscultation without rales, wheezing or rhonchi  ABDOMEN: Soft, non-tender, non-distended MUSCULOSKELETAL:  No edema; No deformity  SKIN: Warm and dry NEUROLOGIC:  Alert and oriented x 3 PSYCHIATRIC:  Normal affect   ASSESSMENT:    No diagnosis found.  PLAN:    Hypertension: Allergies to multiple antihypertensives.  Currently on carvedilol 12.5 mg twice daily and chlorthalidone 25 mg daily. -BP improved but remains mildly elevated, will increase carvedilol to 25 mg twice daily  Hyperlipidemia: On rosuvastatin 20 mg daily.  LDL 169 on 04/05/2020.  She reports she has not been taking her statin, encouraged compliance.  Plan to recheck lipid panel once taking statin daily x2 to 3 months  Chest pain: Description suggest noncardiac chest pain, as describes for left-sided chest pain that lasts for few seconds and resolves.  No further cardiac work-up recommended at this time  RTC in ***  Medication Adjustments/Labs and Tests Ordered: Current medicines are reviewed at length with the patient today.  Concerns regarding medicines are outlined above.  No orders of the defined types were placed in this encounter.  No orders of the defined types were placed in this encounter.   There are no Patient Instructions on file for this visit.   Signed, Little Ishikawa, MD  08/28/2020 4:13 PM    [facility]

## 2020-08-30 ENCOUNTER — Ambulatory Visit: Payer: Self-pay | Admitting: Cardiology

## 2020-08-30 NOTE — Progress Notes (Incomplete)
Cardiology Office Note:    Date:  08/30/2020   ID:  Wendy Jensen, DOB [birth year], MRN [id number]  PCP:  Arvilla Market, MD  Cardiologist:  None  Electrophysiologist:  None   Referring MD: Leary Roca*   No chief complaint on file. CC: Follow-up  History of Present Illness:    Wendy Jensen is a 60 y.o. female with a hx of hypertension, PUD, hyperlipidemia who presents for follow-up.  She was referred by Dr. Earlene Plater for evaluation of hypertension, initially seen on 04/05/2020.  She has history of allergy to amlodipine, hydralazine, lisinopril, losartan, and metoprolol.  HCTZ is also listed in allergies, but reports she tolerates OK.  States that she currently is only taking carvedilol 3.125 mg p.o. BID, but sometimes will take the 6.25 mg dose if her BP is elevated.  She is not taking hydrochlorothiazide and takes torsemide 5 mg only about 4 days/week.  She denies any shortness of breath.  Reports occasional chest pain that she describes as left-sided pain that last for few seconds and resolves.  Reports occasional lightheadedness.  Denies any syncope.  No lower extremity edema.  Quit smoking last year.  No known history of heart disease in her immediate family.  At initial clinic visit on 04/05/2020, BP meds were changed to carvedilol and chlorthalidone.  She reports she is feeling significantly improved.  Reports some indigestion but otherwise denies any chest pain.  Denies any dyspnea.  Reports some lightheadedness in the morning before she eats breakfast.  Denies any lower extremity edema.  States that she has been compliant with her BP meds but is only taking her statin 3 to 4 days a week.  Since last clinic visit,  She denies any chest pain, shortness of breath, palpitations, or exertional symptoms. No headaches, lightheadedness, or syncope to report. Also has no lower extremity edema, orthopnea or PND.   Past Medical History:  Diagnosis Date   GERD  (gastroesophageal reflux disease)    Hypertension    Peptic ulcer    Reflux     Past Surgical History:  Procedure Laterality Date   COLONOSCOPY     ESOPHAGEAL DILATION     TUBAL LIGATION     UPPER GI ENDOSCOPY      Current Medications: No outpatient medications have been marked as taking for the 08/30/20 encounter (Appointment) with Little Ishikawa, MD.     Allergies:   Amlodipine benzoate, Amlodipine besylate, Hydralazine, Hydrochlorothiazide, Lisinopril, Losartan, and Metoprolol tartrate   Social History   Socioeconomic History   Marital status: Single    Spouse name: Not on file   Number of children: Not on file   Years of education: Not on file   Highest education level: Not on file  Occupational History   Not on file  Tobacco Use   Smoking status: Former    Packs/day: 0.15    Pack years: 0.00    Types: Cigarettes    Quit date: 09/10/2018    Years since quitting: 1.9   Smokeless tobacco: Never  Vaping Use   Vaping Use: Never used  Substance and Sexual Activity   Alcohol use: Yes    Comment: 1 glass beer per day   Drug use: No   Sexual activity: Not Currently  Other Topics Concern   Not on file  Social History Narrative   Not on file   Social Determinants of Health   Financial Resource Strain: Not on file  Food Insecurity: Not on file  Transportation  Needs: Not on file  Physical Activity: Not on file  Stress: Not on file  Social Connections: Not on file     Family History: The patient's family history includes Hypertension in her father.  ROS:   Please see the history of present illness.    (+) All other systems reviewed and are negative.  EKGs/Labs/Other Studies Reviewed:    The following studies were reviewed today: No prior CV studies available.  EKG:   08/30/2020: EKG is not ordered today. 06/04/2020: sinus rhythm, rate 65, first-degree AV block, nonspecific T wave flattening, QTC 418 04/05/2020: sinus rhythm, rate 65, first-degree  AV block, nonspecific T wave flattening   Recent Labs: 04/05/2020: ALT 14; Hemoglobin 12.9; Platelets 295; TSH 0.855 05/21/2020: BUN 19; Creatinine, Ser 0.73; Magnesium 2.3; Potassium 3.6; Sodium 140  Recent Lipid Panel    Component Value Date/Time   CHOL 261 (H) 04/05/2020 1542   TRIG 86 04/05/2020 1542   HDL 78 04/05/2020 1542   CHOLHDL 3.3 04/05/2020 1542   LDLCALC 169 (H) 04/05/2020 1542   LDLDIRECT 179 (H) 06/24/2019 1412    Physical Exam:    VS:  There were no vitals taken for this visit.    Wt Readings from Last 3 Encounters:  07/07/20 157 lb 12.8 oz (71.6 kg)  06/04/20 155 lb 6.4 oz (70.5 kg)  05/04/20 163 lb (73.9 kg)     GEN:  in no acute distress HEENT: Normal NECK: No JVD; No carotid bruits LYMPHATICS: No lymphadenopathy CARDIAC: RRR, no murmurs, rubs, gallops RESPIRATORY:  Clear to auscultation without rales, wheezing or rhonchi  ABDOMEN: Soft, non-tender, non-distended MUSCULOSKELETAL:  No edema; No deformity  SKIN: Warm and dry NEUROLOGIC:  Alert and oriented x 3 PSYCHIATRIC:  Normal affect   ASSESSMENT:    No diagnosis found.  PLAN:    Hypertension: Allergies to multiple antihypertensives.  Currently on carvedilol 12.5 mg twice daily and chlorthalidone 25 mg daily. -BP improved but remains mildly elevated, will increase carvedilol to 25 mg twice daily  Hyperlipidemia: On rosuvastatin 20 mg daily.  LDL 169 on 04/05/2020.  She reports she has not been taking her statin, encouraged compliance.  Plan to recheck lipid panel once taking statin daily x2 to 3 months  Chest pain: Description suggest noncardiac chest pain, as describes for left-sided chest pain that lasts for few seconds and resolves.  No further cardiac work-up recommended at this time  RTC in ***  Medication Adjustments/Labs and Tests Ordered: Current medicines are reviewed at length with the patient today.  Concerns regarding medicines are outlined above.  No orders of the defined types  were placed in this encounter.  No orders of the defined types were placed in this encounter.   There are no Patient Instructions on file for this visit.   I,Mathew Stumpf,acting as a Neurosurgeon for Little Ishikawa, MD.,have documented all relevant documentation on the behalf of Little Ishikawa, MD,as directed by  Little Ishikawa, MD while in the presence of Little Ishikawa, MD.  ***  Signed, Carlena Bjornstad  08/30/2020 10:19 AM    [facility]

## 2020-08-31 ENCOUNTER — Ambulatory Visit
Admission: RE | Admit: 2020-08-31 | Discharge: 2020-08-31 | Disposition: A | Discharge status: Home / Self Care | Payer: BC Managed Care – PPO | Source: Ambulatory Visit | Attending: Gastroenterology | Admitting: Gastroenterology

## 2020-08-31 DIAGNOSIS — R1310 Dysphagia, unspecified: Secondary | ICD-10-CM

## 2020-09-01 ENCOUNTER — Other Ambulatory Visit: Payer: BC Managed Care – PPO

## 2020-09-24 ENCOUNTER — Other Ambulatory Visit: Payer: Self-pay | Admitting: Internal Medicine

## 2020-09-24 DIAGNOSIS — Z1231 Encounter for screening mammogram for malignant neoplasm of breast: Secondary | ICD-10-CM

## 2020-10-02 ENCOUNTER — Emergency Department (HOSPITAL_COMMUNITY)
Admission: EM | Admit: 2020-10-02 | Discharge: 2020-10-02 | Disposition: A | Discharge status: Home / Self Care | Payer: BC Managed Care – PPO | Attending: Emergency Medicine | Admitting: Emergency Medicine

## 2020-10-02 ENCOUNTER — Other Ambulatory Visit: Payer: Self-pay

## 2020-10-02 DIAGNOSIS — R1013 Epigastric pain: Secondary | ICD-10-CM | POA: Diagnosis not present

## 2020-10-02 DIAGNOSIS — I10 Essential (primary) hypertension: Secondary | ICD-10-CM | POA: Diagnosis not present

## 2020-10-02 DIAGNOSIS — K219 Gastro-esophageal reflux disease without esophagitis: Secondary | ICD-10-CM | POA: Diagnosis not present

## 2020-10-02 DIAGNOSIS — Z79899 Other long term (current) drug therapy: Secondary | ICD-10-CM | POA: Diagnosis not present

## 2020-10-02 DIAGNOSIS — Z87891 Personal history of nicotine dependence: Secondary | ICD-10-CM | POA: Diagnosis not present

## 2020-10-02 DIAGNOSIS — R109 Unspecified abdominal pain: Secondary | ICD-10-CM | POA: Diagnosis present

## 2020-10-02 LAB — COMPREHENSIVE METABOLIC PANEL
ALT: 20 U/L (ref 0–44)
AST: 31 U/L (ref 15–41)
Albumin: 3.7 g/dL (ref 3.5–5.0)
Alkaline Phosphatase: 58 U/L (ref 38–126)
Anion gap: 8 (ref 5–15)
BUN: 15 mg/dL (ref 6–20)
CO2: 33 mmol/L — ABNORMAL HIGH (ref 22–32)
Calcium: 9.3 mg/dL (ref 8.9–10.3)
Chloride: 96 mmol/L — ABNORMAL LOW (ref 98–111)
Creatinine, Ser: 0.84 mg/dL (ref 0.44–1.00)
GFR, Estimated: >60 mL/min (ref >60)
Glucose, Bld: 134 mg/dL — ABNORMAL HIGH (ref 70–99)
Potassium: 3.2 mmol/L — ABNORMAL LOW (ref 3.5–5.1)
Sodium: 137 mmol/L (ref 135–145)
Total Bilirubin: 1 mg/dL (ref 0.3–1.2)
Total Protein: 6.9 g/dL (ref 6.5–8.1)

## 2020-10-02 LAB — CBC
HCT: 39.7 % (ref 36.0–46.0)
Hemoglobin: 13.2 g/dL (ref 12.0–15.0)
MCH: 30.7 pg (ref 26.0–34.0)
MCHC: 33.2 g/dL (ref 30.0–36.0)
MCV: 92.3 fL (ref 80.0–100.0)
Platelets: 272 10*3/uL (ref 150–400)
RBC: 4.3 MIL/uL (ref 3.87–5.11)
RDW: 12.5 % (ref 11.5–15.5)
WBC: 6.1 10*3/uL (ref 4.0–10.5)
nRBC: 0 % (ref 0.0–0.2)

## 2020-10-02 LAB — LIPASE, BLOOD: Lipase: 33 U/L (ref 11–51)

## 2020-10-02 MED ORDER — OMEPRAZOLE 20 MG PO CPDR: 20.0000 mg | DELAYED_RELEASE_CAPSULE | Freq: Two times a day (BID) | ORAL | 0 refills | Status: DC

## 2020-10-02 NOTE — ED Triage Notes (Signed)
Pt with hx of GERD here to see if she can get an endoscopy. Pt reports having a barium swallow test done at the end of last month and GI doctor wanted her to have an endoscopy but patient does not have the money for it. Reports continued GERD symptoms, unchanged despite taking medication as prescribed.

## 2020-10-02 NOTE — ED Notes (Signed)
Pt stated she was going to sit outside since it is warmer outside.

## 2020-10-02 NOTE — ED Provider Notes (Signed)
[facility] EMERGENCY DEPARTMENT Provider Note   CSN: [id number] Arrival date & time: 10/02/20  0715     History Chief Complaint  Patient presents with   Gastroesophageal Reflux    Wendy Jensen is a 60 y.o. female.  Patient presents with reflux symptoms for several months.  She states that when she swallows food it hurts.  She was seen by a gastroenterologist and had a barium swallow done about 2 weeks ago, she was advised by her GI doctor to pursue endoscopy but she states that she is trying to get in her insurance to cover it but having difficulty and presents to the ER to try and obtain endoscopy.  Denies any fevers or cough or vomiting or diarrhea.  She is able to tolerate oral intake but states it hurts when she swallows.       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     Family History  Problem Relation Age of Onset   Hypertension Father     Social History   Tobacco Use   Smoking status: Former    Packs/day: 0.15    Types: Cigarettes    Quit date: 09/10/2018    Years since quitting: 2.0   Smokeless tobacco: Never  Vaping Use   Vaping Use: Never used  Substance Use Topics   Alcohol use: Yes    Comment: 1 glass beer per day   Drug use: No    Home Medications Prior to Admission medications    Medication Sig Start Date End Date Taking? Authorizing Provider  omeprazole (PRILOSEC) 20 MG capsule Take 1 capsule (20 mg total) by mouth 2 (two) times daily before a meal for 20 days. 10/02/20 10/22/20 Yes Cheryll Cockayne, MD  carvedilol (COREG) 25 MG tablet Take 1 tablet (25 mg total) by mouth 2 (two) times daily. 06/04/20 05/30/21  Little Ishikawa, MD  chlorthalidone (HYGROTON) 25 MG tablet Take 1 tablet (25 mg total) by mouth daily. 05/04/20 08/02/20  Little Ishikawa, MD  rosuvastatin (CRESTOR) 20 MG tablet TAKE 1 TABLET(20 MG) BY MOUTH DAILY 05/12/20   Arvilla Market, MD  sertraline (ZOLOFT) 50 MG tablet Take 1 tablet (50 mg total) by mouth daily. 02/10/20   Mayers, Cari S, PA-C    Allergies    Amlodipine benzoate, Amlodipine besylate, Hydralazine, Hydrochlorothiazide, Lisinopril, Losartan, and Metoprolol tartrate  Review of Systems   Review of Systems  Constitutional:  Negative for fever.  HENT:  Negative for ear pain.   Eyes:  Negative for pain.  Respiratory:  Negative for cough.   Cardiovascular:  Negative for chest pain.  Gastrointestinal:  Positive for abdominal pain.  Genitourinary:  Negative for flank pain.  Musculoskeletal:  Negative for back pain.  Skin:  Negative for  rash.  Neurological:  Negative for headaches.   Physical Exam Updated Vital Signs BP (!) 149/76   Pulse (!) 56   Temp 98.2 F (36.8 C) (Oral)   Resp 14   SpO2 99%   Physical Exam Constitutional:      General: She is not in acute distress.    Appearance: Normal appearance.  HENT:     Head: Normocephalic.     Nose: Nose normal.  Eyes:     Extraocular Movements: Extraocular movements intact.  Cardiovascular:     Rate and Rhythm: Normal rate.  Pulmonary:     Effort: Pulmonary effort is normal.  Musculoskeletal:        General: Normal range of motion.     Cervical back: Normal range of motion.  Neurological:     General: No focal deficit present.     Mental Status: She is  alert. Mental status is at baseline.    ED Results / Procedures / Treatments   Labs (all labs ordered are listed, but only abnormal results are displayed) Labs Reviewed  COMPREHENSIVE METABOLIC PANEL - Abnormal; Notable for the following components:      Result Value   Potassium 3.2 (*)    Chloride 96 (*)    CO2 33 (*)    Glucose, Bld 134 (*)    All other components within normal limits  LIPASE, BLOOD  CBC  URINALYSIS, ROUTINE W REFLEX MICROSCOPIC    EKG None  Radiology No results found.  Procedures Procedures   Medications Ordered in ED Medications - No data to display  ED Course  I have reviewed the triage vital signs and the nursing notes.  Pertinent labs & imaging results that were available during my care of the patient were reviewed by me and considered in my medical decision making (see chart for details).    MDM Rules/Calculators/A&P                           Abdominal exam is benign.  No guarding or rebound noted no epigastric tenderness noted.  Labs unremarkable.  I advised the patient that I would not be able to arrange a emergent endoscopy for her.  I gave her additional GI physician resources.  I gave her a prescription of Prilosec to follow-up with.  Patient advised continued outpatient follow-up, advised immediate return if she has worsening symptoms vomiting fevers worsening pain or any additional concerns.  Final Clinical Impression(s) / ED Diagnoses Final diagnoses:  Epigastric pain    Rx / DC Orders ED Discharge Orders          Ordered    omeprazole (PRILOSEC) 20 MG capsule  2 times daily before meals        10/02/20 1614             Cheryll Cockayne, MD 10/02/20 1615

## 2020-10-02 NOTE — Discharge Instructions (Addendum)
Call your primary care doctor or specialist as discussed in the next 2-3 days.   Return immediately back to the ER if:  Your symptoms worsen within the next 12-24 hours. You develop new symptoms such as new fevers, persistent vomiting, new pain, shortness of breath, or new weakness or numbness, or if you have any other concerns.  

## 2020-10-05 ENCOUNTER — Ambulatory Visit (INDEPENDENT_AMBULATORY_CARE_PROVIDER_SITE_OTHER): Payer: BC Managed Care – PPO | Admitting: Physician Assistant

## 2020-10-05 ENCOUNTER — Other Ambulatory Visit: Payer: Self-pay

## 2020-10-05 ENCOUNTER — Encounter: Payer: Self-pay | Admitting: Physician Assistant

## 2020-10-05 VITALS — BP 132/87 | HR 70 | Temp 98.2°F | Resp 18 | Ht 64.0 in | Wt 150.0 lb

## 2020-10-05 DIAGNOSIS — F411 Generalized anxiety disorder: Secondary | ICD-10-CM

## 2020-10-05 DIAGNOSIS — K224 Dyskinesia of esophagus: Secondary | ICD-10-CM | POA: Diagnosis not present

## 2020-10-05 DIAGNOSIS — K219 Gastro-esophageal reflux disease without esophagitis: Secondary | ICD-10-CM

## 2020-10-05 MED ORDER — OMEPRAZOLE 20 MG PO CPDR: DELAYED_RELEASE_CAPSULE | ORAL | 2 refills | Status: DC

## 2020-10-05 NOTE — Progress Notes (Signed)
Established Patient Office Visit  Subjective:  Patient ID: Wendy Jensen, female    DOB: [birth year]  Age: 60 y.o. MRN: [id number]  CC:  Chief Complaint  Patient presents with   Gastroesophageal Reflux    HPI Wendy Jensen reports that she was seen at the emergency department on October 02, 2020.   Wendy Jensen is a 60 y.o. female.   Patient presents with reflux symptoms for several months.  She states that when she swallows food it hurts.  She was seen by a gastroenterologist and had a barium swallow done about 2 weeks ago, she was advised by her GI doctor to pursue endoscopy but she states that she is trying to get in her insurance to cover it but having difficulty and presents to the ER to try and obtain endoscopy.  Denies any fevers or cough or vomiting or diarrhea.  She is able to tolerate oral intake but states it hurts when she swallows.   MDM Rules/Calculators/A&P                             Abdominal exam is benign.  No guarding or rebound noted no epigastric tenderness noted.  Labs unremarkable.  I advised the patient that I would not be able to arrange a emergent endoscopy for her.  I gave her additional GI physician resources.  I gave her a prescription of Prilosec to follow-up with.  Patient advised continued outpatient follow-up, advised immediate return if she has worsening symptoms vomiting fevers worsening pain or any additional concerns.  Reports today that she has been taking the Prilosec once daily, states that has offered some relief.  Does endorse that she tends to notice food getting stuck when she is eating something that is drying crumbly, or drinking in ice cold beverage.  Reports that the food does eventually go down, does not feel she is choking, is able to swallow pills.  Reports that she has been unable to come up with the money to get the endoscopy, states that she has been having elevated stress due to her finances.   Past Medical  History:  Diagnosis Date   GERD (gastroesophageal reflux disease)    Hypertension    Peptic ulcer    Reflux     Past Surgical History:  Procedure Laterality Date   COLONOSCOPY     ESOPHAGEAL DILATION     TUBAL LIGATION     UPPER GI ENDOSCOPY      Family History  Problem Relation Age of Onset   Hypertension Father     Social History   Socioeconomic History   Marital status: Single    Spouse name: Not on file   Number of children: Not on file   Years of education: Not on file   Highest education level: Not on file  Occupational History   Not on file  Tobacco Use   Smoking status: Former    Packs/day: 0.15    Types: Cigarettes    Quit date: 09/10/2018    Years since quitting: 2.0   Smokeless tobacco: Never  Vaping Use   Vaping Use: Never used  Substance and Sexual Activity   Alcohol use: Yes    Comment: 1 glass beer per day   Drug use: No   Sexual activity: Not Currently  Other Topics Concern   Not on file  Social History Narrative   Not on file   Social Determinants of Health  Financial Resource Strain: Not on file  Food Insecurity: Not on file  Transportation Needs: Not on file  Physical Activity: Not on file  Stress: Not on file  Social Connections: Not on file  Intimate Partner Violence: Not on file    Outpatient Medications Prior to Visit  Medication Sig Dispense Refill   carvedilol (COREG) 25 MG tablet Take 1 tablet (25 mg total) by mouth 2 (two) times daily. 180 tablet 3   chlorthalidone (HYGROTON) 25 MG tablet Take 1 tablet (25 mg total) by mouth daily. 90 tablet 3   rosuvastatin (CRESTOR) 20 MG tablet TAKE 1 TABLET(20 MG) BY MOUTH DAILY 90 tablet 0   sertraline (ZOLOFT) 50 MG tablet Take 1 tablet (50 mg total) by mouth daily. 30 tablet 3   omeprazole (PRILOSEC) 20 MG capsule Take 1 capsule (20 mg total) by mouth 2 (two) times daily before a meal for 20 days. 40 capsule 0   No facility-administered medications prior to visit.    Allergies   Allergen Reactions   Amlodipine Benzoate Other (See Comments)    Patient reports this made her feel sick.   Amlodipine Besylate     Patient reports this makes her feel sick.   Hydralazine Other (See Comments)   Hydrochlorothiazide Other (See Comments)   Lisinopril Other (See Comments)    Patient reports this made her feel sick.   Losartan Other (See Comments)   Metoprolol Tartrate     Patient reports this makes her feel sick.    ROS Review of Systems  Constitutional:  Negative for chills and fever.  HENT:  Positive for trouble swallowing. Negative for sore throat.   Eyes: Negative.   Respiratory:  Negative for shortness of breath.   Cardiovascular:  Negative for chest pain.  Gastrointestinal:  Negative for abdominal pain.  Endocrine: Negative.   Genitourinary: Negative.   Musculoskeletal: Negative.   Skin: Negative.   Allergic/Immunologic: Negative.   Neurological: Negative.   Hematological: Negative.   Psychiatric/Behavioral:  Negative for self-injury and suicidal ideas. The patient is nervous/anxious.      Objective:    Physical Exam Vitals and nursing note reviewed.  Constitutional:      Appearance: Normal appearance.  HENT:     Head: Normocephalic and atraumatic.     Right Ear: External ear normal.     Left Ear: External ear normal.     Nose: Nose normal.     Mouth/Throat:     Mouth: Mucous membranes are moist.     Pharynx: Oropharynx is clear.  Eyes:     Extraocular Movements: Extraocular movements intact.     Conjunctiva/sclera: Conjunctivae normal.     Pupils: Pupils are equal, round, and reactive to light.  Cardiovascular:     Rate and Rhythm: Normal rate and regular rhythm.     Pulses: Normal pulses.     Heart sounds: Normal heart sounds.  Pulmonary:     Effort: Pulmonary effort is normal.     Breath sounds: Normal breath sounds.  Musculoskeletal:        General: Normal range of motion.     Cervical back: Normal range of motion and neck supple.   Skin:    General: Skin is warm and dry.  Neurological:     General: No focal deficit present.     Mental Status: She is alert and oriented to person, place, and time.  Psychiatric:        Mood and Affect: Mood normal.          Behavior: Behavior normal.        Thought Content: Thought content normal.        Judgment: Judgment normal.    BP 132/87 (BP Location: Right Arm, Patient Position: Sitting, Cuff Size: Normal)   Pulse 70   Temp 98.2 F (36.8 C) (Oral)   Resp 18   Ht 5' 4" (1.626 m)   Wt 150 lb (68 kg)   SpO2 95%   BMI 25.75 kg/m  Wt Readings from Last 3 Encounters:  10/05/20 150 lb (68 kg)  07/07/20 157 lb 12.8 oz (71.6 kg)  06/04/20 155 lb 6.4 oz (70.5 kg)     Health Maintenance Due  Topic Date Due   COVID-19 Vaccine (1) Never done   Pneumococcal Vaccine 0-64 Years old (1 - PCV) Never done   Zoster Vaccines- Shingrix (1 of 2) Never done    There are no preventive care reminders to display for this patient.  Lab Results  Component Value Date   TSH 0.855 04/05/2020   Lab Results  Component Value Date   WBC 6.1 10/02/2020   HGB 13.2 10/02/2020   HCT 39.7 10/02/2020   MCV 92.3 10/02/2020   PLT 272 10/02/2020   Lab Results  Component Value Date   NA 137 10/02/2020   K 3.2 (L) 10/02/2020   CO2 33 (H) 10/02/2020   GLUCOSE 134 (H) 10/02/2020   BUN 15 10/02/2020   CREATININE 0.84 10/02/2020   BILITOT 1.0 10/02/2020   ALKPHOS 58 10/02/2020   AST 31 10/02/2020   ALT 20 10/02/2020   PROT 6.9 10/02/2020   ALBUMIN 3.7 10/02/2020   CALCIUM 9.3 10/02/2020   ANIONGAP 8 10/02/2020   EGFR 95 05/21/2020   Lab Results  Component Value Date   CHOL 261 (H) 04/05/2020   Lab Results  Component Value Date   HDL 78 04/05/2020   Lab Results  Component Value Date   LDLCALC 169 (H) 04/05/2020   Lab Results  Component Value Date   TRIG 86 04/05/2020   Lab Results  Component Value Date   CHOLHDL 3.3 04/05/2020   Lab Results  Component Value Date    HGBA1C 5.9 (H) 04/05/2020      Assessment & Plan:   Problem List Items Addressed This Visit       Digestive   GERD - Primary   Relevant Medications   omeprazole (PRILOSEC) 20 MG capsule     Other   Generalized anxiety disorder   Other Visit Diagnoses     Esophageal spasm       Relevant Medications   omeprazole (PRILOSEC) 20 MG capsule       Meds ordered this encounter  Medications   omeprazole (PRILOSEC) 20 MG capsule    Sig: Take 2 tablets PO  in the morning for the next 14 days, then take 1 tablet PO once daily    Dispense:  42 capsule    Refill:  2    Change in dosing    Order Specific Question:   Supervising Provider    Answer:   WRIGHT, PATRICK E [[id number]]   1. Gastroesophageal reflux disease, unspecified whether esophagitis present Encourage patient to increase Prilosec, 40 mg once daily for the next 14 days, then return to 20 mg once daily.  Report from barium swallow:   FINDINGS: Initial barium swallows demonstrate normal pharyngeal motion with swallowing. No laryngeal penetration or aspiration. No upper esophageal webs, strictures or diverticuli.   Normal esophageal motility. Mild extrinsic compression   of the upper thoracic esophagus due to tortuosity and ectasia of the thoracic aorta. No esophageal mass is identified. Persistent smooth narrowing involving the distal esophagus could reflect a mild reflux stricture. However, patient would not attempt to swallow the barium pill.   No hiatal hernia could be demonstrated although there was a small 1 on the prior study from 2006. No GE reflux was demonstrated.   IMPRESSION: 1. Normal esophageal motility. 2. Smooth narrowing of the distal esophagus could reflect a mild reflux stricture. Patient would not swallow a barium pill for more definitive evaluation. Endoscopic evaluation may be indicated. 3. No hiatal hernia or GE reflux was demonstrated.     Electronically Signed   By: Marijo Sanes M.D.    On: 08/31/2020 10:06  - omeprazole (PRILOSEC) 20 MG capsule; Take 2 tablets PO  in the morning for the next 14 days, then take 1 tablet PO once daily  Dispense: 42 capsule; Refill: 2  2. Esophageal spasm Patient education given on preventative measures, patient education given on ways to reduce stress.  Red flags given for prompt reevaluation - omeprazole (PRILOSEC) 20 MG capsule; Take 2 tablets PO  in the morning for the next 14 days, then take 1 tablet PO once daily  Dispense: 42 capsule; Refill: 2  3. Generalized anxiety disorder    I have reviewed the patient's medical history (PMH, PSH, Social History, Family History, Medications, and allergies) , and have been updated if relevant. I spent 32 minutes reviewing chart and  face to face time with patient.     Follow-up: Return if symptoms worsen or fail to improve.    Loraine Grip Mayers, PA-C

## 2020-10-05 NOTE — Patient Instructions (Signed)
For the next 2 weeks, you will take 40 mg of omeprazole once daily, and then return to 20 mg once daily.  Make sure that you are taking small bites, and chew well.  Do not drink ice cold beverages with your meals.  I encourage you to work on reducing your stress level.  Please let us know if there is anything else we can do for you.  Kennieth Rad, PA-C Physician Assistant [facility] [email or web address]   Esophageal Spasm  An esophageal spasm is a sudden tightening (contraction) of the esophagus, which is the part of the body that moves food from the mouth to the stomach. Normally, smooth, wave-like muscle contractions move food and liquids down the esophagus. Esophageal spasms are abnormal muscle contractions that can cause chest pain and trouble swallowing (dysphagia). Spasms may also cause swallowed foods or liquids to come back up into the throat (regurgitation). There are two types of esophageal spasms. You may have one or both types: Diffuse esophageal spasms. These are irregular, uncoordinated spasms. This type tends to cause more dysphagia. Nutcracker esophagus. This is a type of spasm in which the muscles move normally, but the contraction is very strong. This type tends to be more painful. Severe esophageal spasms can make it hard to eat and do everyday activities. They often occur with severe heartburn (reflux esophagitis). The symptoms can come and go and may be triggered or worsened depending onyour diet or other medical issues. What are the causes? The cause of esophageal spasms is not known. What increases the risk? The following factors may make you more likely to develop esophageal spasms: Being female. Age. The risk may increase as you get older. Depression or anxiety. Having gastroesophageal reflux disease (GERD). What are the signs or symptoms? Symptoms may vary from day to day. They may be mild or severe. They  may last for minutes or hours. Common symptoms include: Chest pain. This may feel like a heart attack. Back pain. Dysphagia. Heartburn. A feeling that something is stuck in the throat (globus). Regurgitation of foods or liquids. For some people, certain things may trigger symptoms, such as: Certain foods and drinks. These may include very hot or very cold foods or drinks. Eating very quickly. How is this diagnosed? This condition may be diagnosed based on your symptoms and a physical exam. You may have tests, such as: Endoscopy. This involves using a flexible tube that has a camera on the end of it (endoscope) to look down your throat and examine your esophagus. Barium swallow. For this test, you drink a substance that will show up well on X-rays (barium) and then have X-rays to see how the substance moves through your esophagus. Esophageal manometry. This involves passing a small, thin tube through your nose and down into your throat. The tube contains pressure sensors that measure muscle contractions in the esophagus while you swallow. How is this treated? Mild esophageal spasms may not need treatment. You may be able to manage the spasms by avoiding triggers. For more frequent or severe spasms, treatment may include: Medicine to: Relax the esophageal muscles. Relieve muscle spasms (calcium channel blockers and nitrates). Relieve pain by blocking nerve endings in the esophagus. This is done with an injection of a toxin (botulinum). Relieve heartburn (proton pump inhibitors). Antidepressant medicines. These are sometimes used to ease symptoms. Surgery to reduce esophageal muscle contractions (myotomy). This may be needed for very severe cases. Follow these instructions at home: Eating and drinking Keep track  of foods, drinks, and habits that trigger spasms or heartburn. Avoid these triggers as much as you can. Eat meals slowly. Chew food completely before swallowing. Avoid swallowing  foods and drinks when they are very hot or very cold. General instructions Take over-the-counter and prescription medicines only as told by your health care provider. Find ways to manage stress, such as regular exercise or meditation. If you struggle with depression or anxiety, talk with your health care provider about treatment options. Keep all follow-up visits. This is important. Contact a health care provider if: Your symptoms get worse or do not get better with medicine. You are losing weight because of dysphagia. Your esophageal spasms affect your quality of life, such as your ability to eat. Get help right away if: You have severe chest pain. You have chest pain that is different from your usual chest pain. You have trouble breathing. You choke. These symptoms may represent a serious problem that is an emergency. Do not wait to see if the symptoms will go away. Get medical help right away. Call your local emergency services (911 in the [place] Do not drive yourself to the hospital. Summary An esophageal spasm is a sudden tightening (contraction) of the esophagus, which is the part of the body that moves food from the mouth to the stomach. These abnormal muscle contractions can cause chest pain and trouble swallowing (dysphagia). The cause of esophageal spasms is not known. Treatment may not be needed for mild spasms. For frequent or more severe spasms, treatment may include medicine, or, for very severe spasms, surgery. Keep track of foods, drinks, and habits that trigger spasms or heartburn. Avoid these triggers as much as you can. This information is not intended to replace advice given to you by your health care provider. Make sure you discuss any questions you have with your healthcare provider. Document Revised: 10/18/2019 Document Reviewed: 10/18/2019 Elsevier Patient Education  2022 [place], Adult Feeling a certain amount of stress is normal. Stress  helps our body and mind get ready to deal with the demands of life. Stress hormones can motivate you to do well at work and meet your responsibilities. However severe or long-lasting (chronic) stress can affect your mental and physical health. Chronic stress puts you at higher risk for anxiety, depression, and other health problems like digestiveproblems, muscle aches, heart disease, high blood pressure, and stroke. What are the causes? Common causes of stress include: Demands from work, such as deadlines, feeling overworked, or having long hours. Pressures at home, such as money issues, disagreements with a spouse, or parenting issues. Pressures from major life changes, such as divorce, moving, loss of a loved one, or chronic illness. You may be at higher risk for stress-related problems if you do not get enough sleep, are in poor health, do not have emotional support, or have a mentalhealth disorder like anxiety or depression. How to recognize stress Stress can make you: Have trouble sleeping. Feel sad, anxious, irritable, or overwhelmed. Lose your appetite. Overeat or want to eat unhealthy foods. Want to use drugs or alcohol. Stress can also cause physical symptoms, such as: Sore, tense muscles, especially in the shoulders and neck. Headaches. Trouble breathing. A faster heart rate. Stomach pain, nausea, or vomiting. Diarrhea or constipation. Trouble concentrating. Follow these instructions at home: Lifestyle Identify the source of your stress and your reaction to it. See a therapist who can help you change your reactions. When there are stressful events: Talk about it with  family, friends, or co-workers. Try to think realistically about stressful events and not ignore them or overreact. Try to find the positives in a stressful situation and not focus on the negatives. Cut back on responsibilities at work and home, if possible. Ask for help from friends or family members if you need  it. Find ways to cope with stress, such as: Meditation. Deep breathing. Yoga or tai chi. Progressive muscle relaxation. Doing art, playing music, or reading. Making time for fun activities. Spending time with family and friends. Get support from family, friends, or spiritual resources. Eating and drinking Eat a healthy diet. This includes: Eating foods that are high in fiber, such as beans, whole grains, and fresh fruits and vegetables. Limiting foods that are high in fat and processed sugars, such as fried and sweet foods. Do not skip meals or overeat. Drink enough fluid to keep your urine pale yellow. Alcohol use Do not drink alcohol if: Your health care provider tells you not to drink. You are pregnant, may be pregnant, or are planning to become pregnant. Drinking alcohol is a way some people try to ease their stress. This can be dangerous, so if you drink alcohol: Limit how much you use to: 0-1 drink a day for women. 0-2 drinks a day for men. Be aware of how much alcohol is in your drink. In the [place] one drink equals one 12 oz bottle of beer (355 mL), one 5 oz glass of wine (148 mL), or one 1 oz glass of hard liquor (44 mL). Activity  Include 30 minutes of exercise in your daily schedule. Exercise is a good stress reducer. Include time in your day for an activity that you find relaxing. Try taking a walk, going on a bike ride, reading a book, or listening to music. Schedule your time in a way that lowers stress, and keep a consistent schedule. Prioritize what is most important to get done.  General instructions Get enough sleep. Try to go to sleep and get up at about the same time every day. Take over-the-counter and prescription medicines only as told by your health care provider. Do not use any products that contain nicotine or tobacco, such as cigarettes, e-cigarettes, and chewing tobacco. If you need help quitting, ask your health care provider. Do not use drugs or smoke  to cope with stress. Keep all follow-up visits as told by your health care provider. This is important. Where to find support Talk with your health care provider about stress management or finding a support group. Find a therapist to work with you on your stress management techniques. Contact a health care provider if: Your stress symptoms get worse. You are unable to manage your stress at home. You are struggling to stop using drugs or alcohol. Get help right away if: You may be a danger to yourself or others. You have any thoughts of death or suicide. If you ever feel like you may hurt yourself or others, or have thoughts about taking your own life, get help right away. You can go to your nearest emergency department or call: Your local emergency services (911 in the [place] A suicide crisis helpline, such as the [place] at (510)045-6240. This is open 24 hours a day. Summary Feeling a certain amount of stress is normal, but severe or long-lasting (chronic) stress can affect your mental and physical health. Chronic stress can put you at higher risk for anxiety, depression, and other health problems like digestive problems, muscle  aches, heart disease, high blood pressure, and stroke. You may be at higher risk for stress-related problems if you do not get enough sleep, are in poor health, lack emotional support, or have a mental health disorder like anxiety or depression. Identify the source of your stress and your reaction to it. Try talking about stressful events with family, friends, or co-workers, finding a coping method, or getting support from spiritual resources. If you need more help, talk with your health care provider about finding a support group or a mental health therapist. This information is not intended to replace advice given to you by your health care provider. Make sure you discuss any questions you have with your healthcare provider. Document  Revised: 09/25/2018 Document Reviewed: 09/25/2018 Elsevier Patient Education  [place].

## 2020-10-05 NOTE — Progress Notes (Signed)
Patient shares the omeprazole has provided relief where she felt pressure. Patient denies pain at this time. Patient has eaten today and taken medication today.

## 2020-11-18 ENCOUNTER — Other Ambulatory Visit: Payer: Self-pay

## 2020-11-18 ENCOUNTER — Ambulatory Visit
Admission: RE | Admit: 2020-11-18 | Discharge: 2020-11-18 | Disposition: A | Discharge status: Home / Self Care | Payer: BC Managed Care – PPO | Source: Ambulatory Visit | Attending: Internal Medicine | Admitting: Internal Medicine

## 2020-11-18 DIAGNOSIS — Z1231 Encounter for screening mammogram for malignant neoplasm of breast: Secondary | ICD-10-CM

## 2020-11-26 ENCOUNTER — Other Ambulatory Visit: Payer: Self-pay

## 2020-11-29 ENCOUNTER — Ambulatory Visit: Payer: BC Managed Care – PPO | Admitting: Family Medicine

## 2020-11-29 ENCOUNTER — Other Ambulatory Visit: Payer: Self-pay

## 2020-11-30 ENCOUNTER — Encounter: Payer: Self-pay | Admitting: Nurse Practitioner

## 2020-11-30 ENCOUNTER — Ambulatory Visit (INDEPENDENT_AMBULATORY_CARE_PROVIDER_SITE_OTHER): Payer: BC Managed Care – PPO | Admitting: Nurse Practitioner

## 2020-11-30 VITALS — BP 143/93 | HR 77

## 2020-11-30 DIAGNOSIS — K219 Gastro-esophageal reflux disease without esophagitis: Secondary | ICD-10-CM

## 2020-11-30 DIAGNOSIS — K224 Dyskinesia of esophagus: Secondary | ICD-10-CM | POA: Diagnosis not present

## 2020-11-30 DIAGNOSIS — L989 Disorder of the skin and subcutaneous tissue, unspecified: Secondary | ICD-10-CM | POA: Diagnosis not present

## 2020-11-30 MED ORDER — CLOTRIMAZOLE-BETAMETHASONE 1-0.05 % EX CREA: 1.0000 "application " | TOPICAL_CREAM | Freq: Every day | CUTANEOUS | 0 refills | Status: DC

## 2020-11-30 MED ORDER — OMEPRAZOLE 20 MG PO CPDR: DELAYED_RELEASE_CAPSULE | ORAL | 2 refills | Status: DC

## 2020-11-30 NOTE — Patient Instructions (Addendum)
GERD:  Will refill Prilosec  GERD diet  Foot lesion:  Will order Lotrisone cream  Will refer to podiatry   Follow up:  Follow up in 4 weeks with PCP    Food Choices for Gastroesophageal Reflux Disease, Adult When you have gastroesophageal reflux disease (GERD), the foods you eat and your eating habits are very important. Choosing the right foods can help ease your discomfort. Think about working with a food expert (dietitian) to help you make good choices. What are tips for following this plan? Reading food labels Look for foods that are low in saturated fat. Foods that may help with your symptoms include: Foods that have less than 5% of daily value (DV) of fat. Foods that have 0 grams of trans fat. Cooking Do not fry your food. Cook your food by baking, steaming, grilling, or broiling. These are all methods that do not need a lot of fat for cooking. To add flavor, try to use herbs that are low in spice and acidity. Meal planning  Choose healthy foods that are low in fat, such as: Fruits and vegetables. Whole grains. Low-fat dairy products. Lean meats, fish, and poultry. Eat small meals often instead of eating 3 large meals each day. Eat your meals slowly in a place where you are relaxed. Avoid bending over or lying down until 2-3 hours after eating. Limit high-fat foods such as fatty meats or fried foods. Limit your intake of fatty foods, such as oils, butter, and shortening. Avoid the following as told by your doctor: Foods that cause symptoms. These may be different for different people. Keep a food diary to keep track of foods that cause symptoms. Alcohol. Drinking a lot of liquid with meals. Eating meals during the 2-3 hours before bed. Lifestyle Stay at a healthy weight. Ask your doctor what weight is healthy for you. If you need to lose weight, work with your doctor to do so safely. Exercise for at least 30 minutes on 5 or more days each week, or as told by your  doctor. Wear loose-fitting clothes. Do not smoke or use any products that contain nicotine or tobacco. If you need help quitting, ask your doctor. Sleep with the head of your bed higher than your feet. Use a wedge under the mattress or blocks under the bed frame to raise the head of the bed. Chew sugar-free gum after meals. What foods should eat? Eat a healthy, well-balanced diet of fruits, vegetables, whole grains, low-fat dairy products, lean meats, fish, and poultry. Each person is different. Foods that may cause symptoms in one person may not cause any symptoms in another person. Work with your doctor to find foods that are safe for you. The items listed above may not be a complete list of what you can eat and drink. Contact a food expert for more options. What foods should I avoid? Limiting some of these foods may help in managing the symptoms of GERD. Everyone is different. Talk with a food expert or your doctor to help you find the exact foods to avoid, if any. Fruits Any fruits prepared with added fat. Any fruits that cause symptoms. For some people, this may include citrus fruits, such as oranges, grapefruit, pineapple, and lemons. Vegetables Deep-fried vegetables. [place] fries. Any vegetables prepared with added fat. Any vegetables that cause symptoms. For some people, this may include tomatoes and tomato products, chili peppers, onions and garlic, and horseradish. Grains Pastries or quick breads with added fat. Meats and other proteins  High-fat meats, such as fatty beef or pork, hot dogs, ribs, ham, sausage, salami, and bacon. Fried meat or protein, including fried fish and fried chicken. Nuts and nut butters, in large amounts. Dairy Whole milk and chocolate milk. Sour cream. Cream. Ice cream. Cream cheese. Milkshakes. Fats and oils Butter. Margarine. Shortening. Ghee. Beverages Coffee and tea, with or without caffeine. Carbonated beverages. Sodas. Energy drinks. Fruit juice made  with acidic fruits, such as orange or grapefruit. Tomato juice. Alcoholic drinks. Sweets and desserts Chocolate and cocoa. Donuts. Seasonings and condiments Pepper. Peppermint and spearmint. Added salt. Any condiments, herbs, or seasonings that cause symptoms. For some people, this may include curry, hot sauce, or vinegar-based salad dressings. The items listed above may not be a complete list of what you should not eat and drink. Contact a food expert for more options. Questions to ask your doctor Diet and lifestyle changes are often the first steps that are taken to manage symptoms of GERD. If diet and lifestyle changes do not help, talk with your doctor about taking medicines. Where to find more information International Foundation for Gastrointestinal Disorders: aboutgerd.org Summary When you have GERD, food and lifestyle choices are very important in easing your symptoms. Eat small meals often instead of 3 large meals a day. Eat your meals slowly and in a place where you are relaxed. Avoid bending over or lying down until 2-3 hours after eating. Limit high-fat foods such as fatty meats or fried foods. This information is not intended to replace advice given to you by your health care provider. Make sure you discuss any questions you have with your health care provider. Document Revised: 09/08/2019 Document Reviewed: 09/08/2019 Elsevier Patient Education  2022 [employer].

## 2020-11-30 NOTE — Progress Notes (Signed)
@Patient  ID: Wendy Jensen, female    DOB: 04-01-1960, 60 y.o.   MRN: [id number]  Chief Complaint  Patient presents with   Gastroesophageal Reflux    Also has itchy dark spot on right foot     Referring provider: [id number]*   HPI  Patient presents today for refill on Prilosec. States this medication has been work well for her. She has noticed that previous stomach pain has returned since she ran out of the medication. Patient is followed by GI.  Patient has noticed a dark area to the top of her right foot that has been present for 2 months. This appeared after a pedicure. She states that it itches. Patient has used peroxide. States that she has not changed any soaps or lotions. Denies any injury.    Denies f/c/s, n/v/d, hemoptysis, PND, chest pain or edema      Allergies  Allergen Reactions   Amlodipine Benzoate Other (See Comments)    Patient reports this made her feel sick.   Amlodipine Besylate     Patient reports this makes her feel sick.   Hydralazine Other (See Comments)   Hydrochlorothiazide Other (See Comments)   Lisinopril Other (See Comments)    Patient reports this made her feel sick.   Losartan Other (See Comments)   Metoprolol Tartrate     Patient reports this makes her feel sick.    Immunization History  Administered Date(s) Administered   Tdap 11/08/2017    Past Medical History:  Diagnosis Date   GERD (gastroesophageal reflux disease)    Hypertension    Peptic ulcer    Reflux     Tobacco History: Social History   Tobacco Use  Smoking Status Former   Packs/day: 0.15   Types: Cigarettes   Quit date: 09/10/2018   Years since quitting: 2.2  Smokeless Tobacco Never   Counseling given: Yes   Outpatient Encounter Medications as of 11/30/2020  Medication Sig   clotrimazole-betamethasone (LOTRISONE) cream Apply 1 application topically daily.   carvedilol (COREG) 25 MG tablet Take 1 tablet (25 mg total) by mouth 2 (two) times  daily.   chlorthalidone (HYGROTON) 25 MG tablet Take 1 tablet (25 mg total) by mouth daily.   omeprazole (PRILOSEC) 20 MG capsule Take 2 tablets PO  in the morning for the next 14 days, then take 1 tablet PO once daily   rosuvastatin (CRESTOR) 20 MG tablet TAKE 1 TABLET(20 MG) BY MOUTH DAILY   sertraline (ZOLOFT) 50 MG tablet Take 1 tablet (50 mg total) by mouth daily.   [DISCONTINUED] omeprazole (PRILOSEC) 20 MG capsule Take 2 tablets PO  in the morning for the next 14 days, then take 1 tablet PO once daily   No facility-administered encounter medications on file as of 11/30/2020.     Review of Systems  Review of Systems  Constitutional: Negative.   HENT: Negative.    Cardiovascular: Negative.   Gastrointestinal: Negative.   Skin:        Dark area to right foot - itches   Allergic/Immunologic: Negative.   Neurological: Negative.   Psychiatric/Behavioral: Negative.        Physical Exam  BP (!) 143/93   Pulse 77   SpO2 93%   Wt Readings from Last 5 Encounters:  10/05/20 150 lb (68 kg)  07/07/20 157 lb 12.8 oz (71.6 kg)  06/04/20 155 lb 6.4 oz (70.5 kg)  05/04/20 163 lb (73.9 kg)  04/05/20 159 lb (72.1 kg)     Physical  Exam Vitals and nursing note reviewed.  Constitutional:      General: She is not in acute distress.    Appearance: She is well-developed.  Cardiovascular:     Rate and Rhythm: Normal rate and regular rhythm.  Pulmonary:     Effort: Pulmonary effort is normal.     Breath sounds: Normal breath sounds.  Musculoskeletal:       Feet:  Feet:     Comments: Dark scaly area noted to foot, nontender, no drainage noted, no inflammation noted Neurological:     Mental Status: She is alert and oriented to person, place, and time.       Assessment & Plan:   GERD GERD:  Will refill Prilosec  GERD diet  Foot lesion:  Will order Lotrisone cream  Will refer to podiatry   Follow up:  Follow up in 4 weeks with PCP   Patient Instructions   GERD:  Will refill Prilosec  GERD diet  Foot lesion:  Will order Lotrisone cream  Will refer to podiatry   Follow up:  Follow up in 4 weeks with PCP    Food Choices for Gastroesophageal Reflux Disease, Adult When you have gastroesophageal reflux disease (GERD), the foods you eat and your eating habits are very important. Choosing the right foods can help ease your discomfort. Think about working with a food expert (dietitian) to help you make good choices. What are tips for following this plan? Reading food labels Look for foods that are low in saturated fat. Foods that may help with your symptoms include: Foods that have less than 5% of daily value (DV) of fat. Foods that have 0 grams of trans fat. Cooking Do not fry your food. Cook your food by baking, steaming, grilling, or broiling. These are all methods that do not need a lot of fat for cooking. To add flavor, try to use herbs that are low in spice and acidity. Meal planning  Choose healthy foods that are low in fat, such as: Fruits and vegetables. Whole grains. Low-fat dairy products. Lean meats, fish, and poultry. Eat small meals often instead of eating 3 large meals each day. Eat your meals slowly in a place where you are relaxed. Avoid bending over or lying down until 2-3 hours after eating. Limit high-fat foods such as fatty meats or fried foods. Limit your intake of fatty foods, such as oils, butter, and shortening. Avoid the following as told by your doctor: Foods that cause symptoms. These may be different for different people. Keep a food diary to keep track of foods that cause symptoms. Alcohol. Drinking a lot of liquid with meals. Eating meals during the 2-3 hours before bed. Lifestyle Stay at a healthy weight. Ask your doctor what weight is healthy for you. If you need to lose weight, work with your doctor to do so safely. Exercise for at least 30 minutes on 5 or more days each week, or as told by  your doctor. Wear loose-fitting clothes. Do not smoke or use any products that contain nicotine or tobacco. If you need help quitting, ask your doctor. Sleep with the head of your bed higher than your feet. Use a wedge under the mattress or blocks under the bed frame to raise the head of the bed. Chew sugar-free gum after meals. What foods should eat? Eat a healthy, well-balanced diet of fruits, vegetables, whole grains, low-fat dairy products, lean meats, fish, and poultry. Each person is different. Foods that may cause symptoms in  one person may not cause any symptoms in another person. Work with your doctor to find foods that are safe for you. The items listed above may not be a complete list of what you can eat and drink. Contact a food expert for more options. What foods should I avoid? Limiting some of these foods may help in managing the symptoms of GERD. Everyone is different. Talk with a food expert or your doctor to help you find the exact foods to avoid, if any. Fruits Any fruits prepared with added fat. Any fruits that cause symptoms. For some people, this may include citrus fruits, such as oranges, grapefruit, pineapple, and lemons. Vegetables Deep-fried vegetables. [place] fries. Any vegetables prepared with added fat. Any vegetables that cause symptoms. For some people, this may include tomatoes and tomato products, chili peppers, onions and garlic, and horseradish. Grains Pastries or quick breads with added fat. Meats and other proteins High-fat meats, such as fatty beef or pork, hot dogs, ribs, ham, sausage, salami, and bacon. Fried meat or protein, including fried fish and fried chicken. Nuts and nut butters, in large amounts. Dairy Whole milk and chocolate milk. Sour cream. Cream. Ice cream. Cream cheese. Milkshakes. Fats and oils Butter. Margarine. Shortening. Ghee. Beverages Coffee and tea, with or without caffeine. Carbonated beverages. Sodas. Energy drinks. Fruit juice  made with acidic fruits, such as orange or grapefruit. Tomato juice. Alcoholic drinks. Sweets and desserts Chocolate and cocoa. Donuts. Seasonings and condiments Pepper. Peppermint and spearmint. Added salt. Any condiments, herbs, or seasonings that cause symptoms. For some people, this may include curry, hot sauce, or vinegar-based salad dressings. The items listed above may not be a complete list of what you should not eat and drink. Contact a food expert for more options. Questions to ask your doctor Diet and lifestyle changes are often the first steps that are taken to manage symptoms of GERD. If diet and lifestyle changes do not help, talk with your doctor about taking medicines. Where to find more information International Foundation for Gastrointestinal Disorders: aboutgerd.org Summary When you have GERD, food and lifestyle choices are very important in easing your symptoms. Eat small meals often instead of 3 large meals a day. Eat your meals slowly and in a place where you are relaxed. Avoid bending over or lying down until 2-3 hours after eating. Limit high-fat foods such as fatty meats or fried foods. This information is not intended to replace advice given to you by your health care provider. Make sure you discuss any questions you have with your health care provider. Document Revised: 09/08/2019 Document Reviewed: 09/08/2019 Elsevier Patient Education  [address]   Ivonne Andrew, [place] 11/30/2020

## 2020-11-30 NOTE — Assessment & Plan Note (Signed)
GERD:  Will refill Prilosec  GERD diet  Foot lesion:  Will order Lotrisone cream  Will refer to podiatry   Follow up:  Follow up in 4 weeks with PCP

## 2020-12-09 ENCOUNTER — Ambulatory Visit: Payer: BC Managed Care – PPO | Admitting: Podiatry

## 2020-12-28 ENCOUNTER — Ambulatory Visit: Payer: BC Managed Care – PPO | Admitting: Family Medicine

## 2021-01-18 ENCOUNTER — Ambulatory Visit (INDEPENDENT_AMBULATORY_CARE_PROVIDER_SITE_OTHER): Payer: BC Managed Care – PPO | Admitting: Family Medicine

## 2021-01-18 ENCOUNTER — Other Ambulatory Visit: Payer: Self-pay

## 2021-01-18 VITALS — BP 153/91 | HR 80 | Temp 97.6°F | Resp 16 | Wt 155.0 lb

## 2021-01-18 DIAGNOSIS — I10 Essential (primary) hypertension: Secondary | ICD-10-CM

## 2021-01-18 DIAGNOSIS — R1310 Dysphagia, unspecified: Secondary | ICD-10-CM | POA: Diagnosis not present

## 2021-01-18 DIAGNOSIS — K219 Gastro-esophageal reflux disease without esophagitis: Secondary | ICD-10-CM

## 2021-01-18 MED ORDER — SUCRALFATE 1 G PO TABS: 1.0000 g | ORAL_TABLET | Freq: Three times a day (TID) | ORAL | 0 refills | Status: DC

## 2021-01-18 NOTE — Progress Notes (Signed)
Patient is still having issues with her swallowing . Patient has been going through with this for about 6 months. Patient may need a referral.

## 2021-01-19 ENCOUNTER — Encounter: Payer: Self-pay | Admitting: Family Medicine

## 2021-01-19 NOTE — Progress Notes (Signed)
Established Patient Office Visit  Subjective:  Patient ID: Wendy Jensen, female    DOB: May 08, 1960  Age: 60 y.o. MRN: [id number]  CC:  Chief Complaint  Patient presents with   Follow-up   Dysphagia    HPI Wendy Jensen presents for follow up of GERD. Patient reports that she still has symptoms when swallowing. She reports that she is unable to have the recommended further evaluation 2/2 $$.  Past Medical History:  Diagnosis Date   GERD (gastroesophageal reflux disease)    Hypertension    Peptic ulcer    Reflux     Past Surgical History:  Procedure Laterality Date   COLONOSCOPY     ESOPHAGEAL DILATION     TUBAL LIGATION     UPPER GI ENDOSCOPY      Social History   Socioeconomic History   Marital status: Single    Spouse name: Not on file   Number of children: Not on file   Years of education: Not on file   Highest education level: Not on file  Occupational History   Not on file  Tobacco Use   Smoking status: Former    Packs/day: 0.15    Types: Cigarettes    Quit date: 09/10/2018    Years since quitting: 2.3   Smokeless tobacco: Never  Vaping Use   Vaping Use: Never used  Substance and Sexual Activity   Alcohol use: Yes    Comment: 1 glass beer per day   Drug use: No   Sexual activity: Not Currently  Other Topics Concern   Not on file  Social History Narrative   Not on file   Social Determinants of Health   Financial Resource Strain: Not on file  Food Insecurity: Not on file  Transportation Needs: Not on file  Physical Activity: Not on file  Stress: Not on file  Social Connections: Not on file  Intimate Partner Violence: Not on file    ROS Review of Systems  HENT:  Positive for trouble swallowing.   Gastrointestinal:  Negative for abdominal pain, constipation, diarrhea, nausea and vomiting.   Objective:   Today's Vitals: BP (!) 153/91   Pulse 80   Temp 97.6 F (36.4 C) (Oral)   Resp 16   Wt 155 lb (70.3 kg)   SpO2 93%    BMI 26.61 kg/m   Physical Exam Vitals and nursing note reviewed.  Constitutional:      General: She is not in acute distress. Cardiovascular:     Rate and Rhythm: Normal rate and regular rhythm.  Pulmonary:     Effort: Pulmonary effort is normal.     Breath sounds: Normal breath sounds.  Abdominal:     Palpations: Abdomen is soft.     Tenderness: There is no abdominal tenderness.  Neurological:     General: No focal deficit present.     Mental Status: She is alert and oriented to person, place, and time.    Assessment & Plan:   1. Gastroesophageal reflux disease, unspecified whether esophagitis present Continue omeprazole. Carafate added to regimen. monitor  2. Dysphagia, unspecified type Awaiting further eval/mgt - patient to look into other resources including insurance change  3. Essential hypertension Slightly elevated readings. Patient with increased social stressors on today. Will monitor   Outpatient Encounter Medications as of 01/18/2021  Medication Sig   carvedilol (COREG) 25 MG tablet Take 1 tablet (25 mg total) by mouth 2 (two) times daily.   clotrimazole-betamethasone (LOTRISONE) cream Apply 1 application  topically daily.   omeprazole (PRILOSEC) 20 MG capsule Take 2 tablets PO  in the morning for the next 14 days, then take 1 tablet PO once daily   rosuvastatin (CRESTOR) 20 MG tablet TAKE 1 TABLET(20 MG) BY MOUTH DAILY   sertraline (ZOLOFT) 50 MG tablet Take 1 tablet (50 mg total) by mouth daily.   sucralfate (CARAFATE) 1 g tablet Take 1 tablet (1 g total) by mouth 4 (four) times daily -  with meals and at bedtime.   chlorthalidone (HYGROTON) 25 MG tablet Take 1 tablet (25 mg total) by mouth daily.   No facility-administered encounter medications on file as of 01/18/2021.    Follow-up: No follow-ups on file.   Tommie Raymond, MD

## 2021-02-17 ENCOUNTER — Ambulatory Visit: Payer: BC Managed Care – PPO | Admitting: Family Medicine

## 2021-02-22 ENCOUNTER — Other Ambulatory Visit: Payer: Self-pay | Admitting: Family Medicine

## 2021-03-16 ENCOUNTER — Encounter: Payer: Self-pay | Admitting: Family Medicine

## 2021-03-16 ENCOUNTER — Other Ambulatory Visit: Payer: Self-pay

## 2021-03-16 ENCOUNTER — Ambulatory Visit (INDEPENDENT_AMBULATORY_CARE_PROVIDER_SITE_OTHER): Payer: BC Managed Care – PPO | Admitting: Family Medicine

## 2021-03-16 ENCOUNTER — Ambulatory Visit
Admission: EM | Admit: 2021-03-16 | Discharge: 2021-03-16 | Disposition: A | Discharge status: Home / Self Care | Payer: BC Managed Care – PPO | Attending: Internal Medicine | Admitting: Internal Medicine

## 2021-03-16 VITALS — BP 145/101 | HR 64 | Temp 97.8°F | Resp 16 | Wt 154.2 lb

## 2021-03-16 DIAGNOSIS — K219 Gastro-esophageal reflux disease without esophagitis: Secondary | ICD-10-CM

## 2021-03-16 DIAGNOSIS — S50862A Insect bite (nonvenomous) of left forearm, initial encounter: Secondary | ICD-10-CM | POA: Diagnosis not present

## 2021-03-16 DIAGNOSIS — H02846 Edema of left eye, unspecified eyelid: Secondary | ICD-10-CM | POA: Diagnosis not present

## 2021-03-16 DIAGNOSIS — W57XXXA Bitten or stung by nonvenomous insect and other nonvenomous arthropods, initial encounter: Secondary | ICD-10-CM

## 2021-03-16 DIAGNOSIS — I10 Essential (primary) hypertension: Secondary | ICD-10-CM | POA: Diagnosis not present

## 2021-03-16 DIAGNOSIS — F411 Generalized anxiety disorder: Secondary | ICD-10-CM | POA: Diagnosis not present

## 2021-03-16 DIAGNOSIS — R030 Elevated blood-pressure reading, without diagnosis of hypertension: Secondary | ICD-10-CM

## 2021-03-16 MED ORDER — AMOXICILLIN-POT CLAVULANATE 875-125 MG PO TABS: 1.0000 | ORAL_TABLET | Freq: Two times a day (BID) | ORAL | 0 refills | Status: DC

## 2021-03-16 MED ORDER — TRIAMCINOLONE ACETONIDE 0.1 % EX CREA: 1.0000 "application " | TOPICAL_CREAM | Freq: Two times a day (BID) | CUTANEOUS | 0 refills | Status: DC

## 2021-03-16 NOTE — Discharge Instructions (Signed)
You have been prescribed an antibiotic for your left eye.  You have also been prescribed a cream to apply to your left arm.  You may also use cool compresses to both areas.  Please monitor very closely.  Go to the eye doctor if eye symptoms worsen or do not not improve.

## 2021-03-16 NOTE — ED Triage Notes (Signed)
Onset of last night of itchy and swollen left eye and arm. Pt believes that she may have been bitten by a spider. Pt applied peroxide to her arm with some itching relief. No meds taken.

## 2021-03-16 NOTE — ED Provider Notes (Addendum)
[facility]    CSN: [id number] Arrival date & time: 03/16/21  0800      History   Chief Complaint Chief Complaint  Patient presents with   Arm Swelling    left   Itchy Eye    left    HPI Wendy Jensen is a 61 y.o. female.   Patient presents with left upper eyelid swelling and itchiness as well as left forearm swelling with associated itchiness that started this morning upon awakening.  Patient reports that she thinks that she got bit by a spider in both of these places while sleeping as this has occurred before.  Denies any pain to the eye or any blurry vision.  Denies trauma or foreign body to the eye.  Patient does not wear contacts.  No pain with extraocular movements.  No drainage from the eye.  No associated upper respiratory symptoms.  Patient has mild swelling and erythema noted to left forearm but no pain.  Denies limited range of motion, numbness, tingling.    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      Home Medications    Prior to Admission medications   Medication Sig Start Date End Date Taking? Authorizing Provider  amoxicillin-clavulanate (AUGMENTIN) 875-125 MG tablet Take 1 tablet by mouth every 12 (twelve) hours. 03/16/21  Yes Leshia Kope, Rolly Salter E, FNP   triamcinolone cream (KENALOG) 0.1 % Apply 1 application topically 2 (two) times daily. Apply to area to left arm 03/16/21  Yes [place], [place] E, FNP  carvedilol (COREG) 25 MG tablet Take 1 tablet (25 mg total) by mouth 2 (two) times daily. 06/04/20 05/30/21  Little Ishikawa, MD  chlorthalidone (HYGROTON) 25 MG tablet Take 1 tablet (25 mg total) by mouth daily. 05/04/20 10/05/20  Little Ishikawa, MD  clotrimazole-betamethasone (LOTRISONE) cream Apply 1 application topically daily. 11/30/20   Ivonne Andrew, NP  omeprazole (PRILOSEC) 20 MG capsule Take 2 tablets PO  in the morning for the next 14 days, then take 1 tablet PO once daily 11/30/20   Ivonne Andrew, NP  rosuvastatin (CRESTOR) 20 MG tablet TAKE 1 TABLET(20 MG) BY MOUTH DAILY 05/12/20   Arvilla Market, MD  sertraline (ZOLOFT) 50 MG tablet Take 1 tablet (50 mg total) by mouth daily. 02/10/20   Mayers, Cari S, PA-C  sucralfate (CARAFATE) 1 g tablet TAKE 1 TABLET(1 GRAM) BY MOUTH FOUR TIMES DAILY(WITH MEALS AND AT BEDTIME) 02/22/21   Georganna Skeans, MD    Family History Family History  Problem Relation Age of Onset   Hypertension Father     Social History Social History   Tobacco Use   Smoking status: Former    Packs/day: 0.15  Types: Cigarettes    Quit date: 09/10/2018    Years since quitting: 2.5   Smokeless tobacco: Never  Vaping Use   Vaping Use: Never used  Substance Use Topics   Alcohol use: Yes    Comment: 1 glass beer per day   Drug use: No     Allergies   Amlodipine benzoate, Amlodipine besylate, Hydralazine, Hydrochlorothiazide, Lisinopril, Losartan, and Metoprolol tartrate   Review of Systems Review of Systems Per HPI  Physical Exam Triage Vital Signs ED Triage Vitals  Enc Vitals Group     BP 03/16/21 0816 (!) 174/108     Pulse Rate 03/16/21 0816 71     Resp 03/16/21 0816 18     Temp 03/16/21 0816 98 F (36.7 C)     Temp Source 03/16/21 0816 Oral     SpO2 03/16/21 0816 95 %      Weight --      Height --      Head Circumference --      Peak Flow --      Pain Score 03/16/21 0819 9     Pain Loc --      Pain Edu? --      Excl. in GC? --    No data found.  Updated Vital Signs BP (!) 157/115 (BP Location: Left Arm)    Pulse 71    Temp 98 F (36.7 C) (Oral)    Resp 18    SpO2 95%   Visual Acuity Right Eye Distance:   Left Eye Distance:   Bilateral Distance:    Right Eye Near:   Left Eye Near:    Bilateral Near:     Physical Exam Constitutional:      General: She is not in acute distress.    Appearance: Normal appearance. She is not toxic-appearing or diaphoretic.  HENT:     Head: Normocephalic and atraumatic.  Eyes:     General: Lids are everted, no foreign bodies appreciated. Vision grossly intact. Gaze aligned appropriately.        Right eye: No foreign body, discharge or hordeolum.        Left eye: No foreign body, discharge or hordeolum.     Extraocular Movements: Extraocular movements intact.     Conjunctiva/sclera: Conjunctivae normal.     Pupils: Pupils are equal, round, and reactive to light.     Comments: Patient has mild swelling and erythema noted to left upper eyelid.  Remainder of eye is normal with no drainage.  No pain with extraocular movements.  Cardiovascular:     Rate and Rhythm: Normal rate and regular rhythm.     Pulses: Normal pulses.     Heart sounds: Normal heart sounds.  Pulmonary:     Effort: Pulmonary effort is normal. No respiratory distress.     Breath sounds: Normal breath sounds.  Skin:    Comments: Patient has mild swelling and erythema noted to left posterior forearm that is approximately 3 inches x 2 inches.  No drainage noted from the site.  Neurological:     General: No focal deficit present.     Mental Status: She is alert and oriented to person, place, and time. Mental status is at baseline.     Cranial Nerves: Cranial nerves 2-12 are intact.     Sensory: Sensation is intact.     Motor: Motor function is  intact.     Coordination: Coordination is intact.     Gait: Gait is intact.  Psychiatric:  Mood and Affect: Mood normal.        Behavior: Behavior normal.        Thought Content: Thought content normal.        Judgment: Judgment normal.     UC Treatments / Results  Labs (all labs ordered are listed, but only abnormal results are displayed) Labs Reviewed - No data to display  EKG   Radiology No results found.  Procedures Procedures (including critical care time)  Medications Ordered in UC Medications - No data to display  Initial Impression / Assessment and Plan / UC Course  I have reviewed the triage vital signs and the nursing notes.  Pertinent labs & imaging results that were available during my care of the patient were reviewed by me and considered in my medical decision making (see chart for details).     Patient's symptoms of left eye and left arm seem consistent with possible bite of insect or spider.  Although, will cover for preseptal cellulitis of left eye just in case with Augmentin.  Left arm appears to be insect bite or spider bite so will cover with triamcinolone cream.  No signs of bacterial infection at this time.  Patient to use cool compresses to both affected areas.  No signs of decreased visual acuity on exam.  Patient to follow-up with eye doctor if left eye symptoms continue.  Advised patient to follow-up if symptoms persist or worsen.  Patient also has elevated blood pressure reading but attributes this to the anxiety surrounding symptoms.  Blood pressure did slightly recover prior to discharge.  No signs of hypertensive urgency including chest pain, shortness of breath, headache, nausea, vomiting, dizziness, blurred vision.  Neuro exam is normal and no signs of endorgan damage.  Advised patient to obtain home blood pressure cuff and to monitor at home and to follow-up with PCP or urgent care if blood pressure remains elevated.  Discussed strict return  precautions.  Patient verbalized understanding and was agreeable with plan. Final Clinical Impressions(s) / UC Diagnoses   Final diagnoses:  Swelling of left eyelid  Insect bite of left forearm, initial encounter  Elevated blood pressure reading     Discharge Instructions      You have been prescribed an antibiotic for your left eye.  You have also been prescribed a cream to apply to your left arm.  You may also use cool compresses to both areas.  Please monitor very closely.  Go to the eye doctor if eye symptoms worsen or do not not improve.    ED Prescriptions     Medication Sig Dispense Auth. Provider   amoxicillin-clavulanate (AUGMENTIN) 875-125 MG tablet Take 1 tablet by mouth every 12 (twelve) hours. 14 tablet [place], BremenHaley E, [place]NP   triamcinolone cream (KENALOG) 0.1 % Apply 1 application topically 2 (two) times daily. Apply to area to left arm 30 g Gustavus BryantMound, Gabriana Wilmott E, [place]NP      PDMP not reviewed this encounter.   Gustavus BryantMound, Damaso Laday E, [place]FNP 03/16/21 0856    Gustavus BryantMound, Jaymin Waln E, [place]NP 03/16/21 [phone]

## 2021-03-16 NOTE — Progress Notes (Signed)
Established Patient Office Visit  Subjective:  Patient ID: Wendy Jensen, female    DOB: [birth year]  Age: 61 y.o. MRN: [id number]  CC:  Chief Complaint  Patient presents with   Hypertension    HPI Wendy Jensen presents for follow up of hypertension. Patient reports that she has had increased social stressors. Patient also reports that she took a break of taking her meds but has been consistent for about 10 days.   Past Medical History:  Diagnosis Date   GERD (gastroesophageal reflux disease)    Hypertension    Peptic ulcer    Reflux     Past Surgical History:  Procedure Laterality Date   COLONOSCOPY     ESOPHAGEAL DILATION     TUBAL LIGATION     UPPER GI ENDOSCOPY      Family History  Problem Relation Age of Onset   Hypertension Father     Social History   Socioeconomic History   Marital status: Single    Spouse name: Not on file   Number of children: Not on file   Years of education: Not on file   Highest education level: Not on file  Occupational History   Not on file  Tobacco Use   Smoking status: Former    Packs/day: 0.15    Types: Cigarettes    Quit date: 09/10/2018    Years since quitting: 2.5   Smokeless tobacco: Never  Vaping Use   Vaping Use: Never used  Substance and Sexual Activity   Alcohol use: Yes    Comment: 1 glass beer per day   Drug use: No   Sexual activity: Not Currently  Other Topics Concern   Not on file  Social History Narrative   Not on file   Social Determinants of Health   Financial Resource Strain: Not on file  Food Insecurity: Not on file  Transportation Needs: Not on file  Physical Activity: Not on file  Stress: Not on file  Social Connections: Not on file  Intimate Partner Violence: Not on file    ROS Review of Systems  All other systems reviewed and are negative.  Objective:   Today's Vitals: BP (!) 145/101    Pulse 64    Temp 97.8 F (36.6 C) (Oral)    Resp 16    Wt 154 lb 3.2 oz (69.9 kg)     BMI 26.47 kg/m   Physical Exam Vitals and nursing note reviewed.  Constitutional:      General: She is not in acute distress. Cardiovascular:     Rate and Rhythm: Normal rate and regular rhythm.  Pulmonary:     Effort: Pulmonary effort is normal.     Breath sounds: Normal breath sounds.  Abdominal:     Palpations: Abdomen is soft.     Tenderness: There is no abdominal tenderness.  Musculoskeletal:     Right lower leg: No edema.     Left lower leg: No edema.  Neurological:     General: No focal deficit present.     Mental Status: She is alert and oriented to person, place, and time.    Assessment & Plan:   1. Essential hypertension Elevated reading. Compliance discussed. continue  2. Gastroesophageal reflux disease, unspecified whether esophagitis present Improved. Continue present management and monitor  3. Generalized anxiety disorder Appears stable. Continue present management and monitor   Outpatient Encounter Medications as of 03/16/2021  Medication Sig   amoxicillin-clavulanate (AUGMENTIN) 875-125 MG tablet Take 1 tablet by  mouth every 12 (twelve) hours.   carvedilol (COREG) 25 MG tablet Take 1 tablet (25 mg total) by mouth 2 (two) times daily.   omeprazole (PRILOSEC) 20 MG capsule Take 2 tablets PO  in the morning for the next 14 days, then take 1 tablet PO once daily   rosuvastatin (CRESTOR) 20 MG tablet TAKE 1 TABLET(20 MG) BY MOUTH DAILY   sertraline (ZOLOFT) 50 MG tablet Take 1 tablet (50 mg total) by mouth daily.   sucralfate (CARAFATE) 1 g tablet TAKE 1 TABLET(1 GRAM) BY MOUTH FOUR TIMES DAILY(WITH MEALS AND AT BEDTIME)   triamcinolone cream (KENALOG) 0.1 % Apply 1 application topically 2 (two) times daily. Apply to area to left arm   [DISCONTINUED] chlorthalidone (HYGROTON) 25 MG tablet Take 1 tablet (25 mg total) by mouth daily.   [DISCONTINUED] clotrimazole-betamethasone (LOTRISONE) cream Apply 1 application topically daily.   No facility-administered  encounter medications on file as of 03/16/2021.    Follow-up: No follow-ups on file.   Tommie Raymond, MD
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
## 2021-03-17 ENCOUNTER — Encounter: Payer: Self-pay | Admitting: Family Medicine

## 2021-03-18 ENCOUNTER — Telehealth: Payer: Self-pay | Admitting: Family Medicine

## 2021-03-18 NOTE — Telephone Encounter (Signed)
° ° °  Pt calling regarding her BP MEDICATIONS- STATES SHE'S BEEN TAKING carvedilol (COREG) 6.25 MG tablet [[id number]]  DISCONTINUED  chlorthalidone (HYGROTON) 25 MG tablet [[id number]]  DISCONTINUED  but her bp has been running high lately which is why she came in last time. States Dr. Andrey Campanile told her to send  a my chart msg regarding msg w/ med names so that's why she's calling and she will continue to check this weekend and will call again on Monday.

## 2021-03-21 ENCOUNTER — Telehealth: Payer: Self-pay | Admitting: Family Medicine

## 2021-03-21 NOTE — Telephone Encounter (Signed)
PT called in to give weekend reading for pcp Friday 179/113, Sat 167/102 and Monday 152/102. Pt states  PCP was going to be changing bp meds according to latest bp readings.

## 2021-03-22 ENCOUNTER — Telehealth: Payer: Self-pay | Admitting: Family Medicine

## 2021-03-22 NOTE — Telephone Encounter (Signed)
Msg has been noted.

## 2021-03-22 NOTE — Telephone Encounter (Signed)
Pt called to give recent BP reading 141/91  And scheduled another appt to follow up w/ PCP for obtaining BP meds

## 2021-03-24 ENCOUNTER — Encounter: Payer: Self-pay | Admitting: Family Medicine

## 2021-03-24 ENCOUNTER — Other Ambulatory Visit: Payer: Self-pay

## 2021-03-24 ENCOUNTER — Ambulatory Visit (INDEPENDENT_AMBULATORY_CARE_PROVIDER_SITE_OTHER): Payer: BC Managed Care – PPO | Admitting: Family Medicine

## 2021-03-24 VITALS — BP 159/82 | HR 81 | Temp 97.8°F | Resp 16 | Wt 153.4 lb

## 2021-03-24 DIAGNOSIS — I10 Essential (primary) hypertension: Secondary | ICD-10-CM

## 2021-03-24 MED ORDER — SPIRONOLACTONE 25 MG PO TABS: 25.0000 mg | ORAL_TABLET | Freq: Every day | ORAL | 0 refills | Status: DC

## 2021-03-24 NOTE — Telephone Encounter (Signed)
Patient was seen at office

## 2021-03-25 ENCOUNTER — Ambulatory Visit (INDEPENDENT_AMBULATORY_CARE_PROVIDER_SITE_OTHER): Payer: BC Managed Care – PPO | Admitting: Adult Health

## 2021-03-25 ENCOUNTER — Encounter: Payer: Self-pay | Admitting: Adult Health

## 2021-03-25 ENCOUNTER — Encounter: Payer: Self-pay | Admitting: Family Medicine

## 2021-03-25 VITALS — BP 138/94 | HR 68 | Ht 65.0 in | Wt 151.0 lb

## 2021-03-25 DIAGNOSIS — I10 Essential (primary) hypertension: Secondary | ICD-10-CM | POA: Diagnosis not present

## 2021-03-25 DIAGNOSIS — Z79899 Other long term (current) drug therapy: Secondary | ICD-10-CM | POA: Diagnosis not present

## 2021-03-25 DIAGNOSIS — E782 Mixed hyperlipidemia: Secondary | ICD-10-CM | POA: Diagnosis not present

## 2021-03-25 NOTE — Progress Notes (Signed)
Cardiology Clinic Note   Patient Name: Wendy Jensen Date of Encounter: 03/25/2021  Primary Care Provider:  Dorna Mai, MD Primary Cardiologist:  Dr. Gardiner Rhyme   Patient Profile    60 year old female with history of hypertension, peptic ulcer disease, hyperlipidemia with allergies to amlodipine, hydralazine, lisinopril, losartan, metoprolol, and HCTZ.  However she reports that she cannot take HCTZ.  She is a former smoker quitting in 2021.  She has had multiple medicatio changes for blood pressure control.  Last seen by Dr. Nechama Guard on 06/04/2020, at which time she had an increase in her dose on carvedilol to 25 mg twice daily and was to continue chlorthalidone 25 mg daily.  Past Medical History    Past Medical History:  Diagnosis Date   GERD (gastroesophageal reflux disease)    Hypertension    Peptic ulcer    Reflux    Past Surgical History:  Procedure Laterality Date   COLONOSCOPY     ESOPHAGEAL DILATION     TUBAL LIGATION     UPPER GI ENDOSCOPY      Allergies  Allergies  Allergen Reactions   Amlodipine Benzoate Other (See Comments)    Patient reports this made her feel sick.   Amlodipine Besylate     Patient reports this makes her feel sick.   Hydralazine Other (See Comments)   Hydrochlorothiazide Other (See Comments)   Lisinopril Other (See Comments)    Patient reports this made her feel sick.   Losartan Other (See Comments)   Metoprolol Tartrate     Patient reports this makes her feel sick.    History of Present Illness    Wendy Jensen hand presents today for ongoing assessment and management of hypertension, and hyperlipidemia.  Allergic to multiple antihypertensives.  Currently on carvedilol 25 mg twice daily and chlorthalidone 25 mg daily.  Her medication list does not have chlorthalidone listed, but has spironolactone instead. She states she cannot remember which one she is taking now.  She reports that she stopped taking antihypertensives for  approximately one month and was also having family problems. She noticed that her BP was rising significantly. She started back on the medication and has been taking it again for the last 2 weeks. BP has gotten better. She still noticed it a little elevated in the am before she takes her medications. She denies any symptoms     Home Medications    Current Outpatient Medications  Medication Sig Dispense Refill   amoxicillin-clavulanate (AUGMENTIN) 875-125 MG tablet Take 1 tablet by mouth every 12 (twelve) hours. 14 tablet 0   carvedilol (COREG) 25 MG tablet Take 1 tablet (25 mg total) by mouth 2 (two) times daily. 180 tablet 3   omeprazole (PRILOSEC) 20 MG capsule Take 2 tablets PO  in the morning for the next 14 days, then take 1 tablet PO once daily 42 capsule 2   rosuvastatin (CRESTOR) 20 MG tablet TAKE 1 TABLET(20 MG) BY MOUTH DAILY 90 tablet 0   sertraline (ZOLOFT) 50 MG tablet Take 1 tablet (50 mg total) by mouth daily. 30 tablet 3   spironolactone (ALDACTONE) 25 MG tablet Take 1 tablet (25 mg total) by mouth daily. 90 tablet 0   sucralfate (CARAFATE) 1 g tablet TAKE 1 TABLET(1 GRAM) BY MOUTH FOUR TIMES DAILY(WITH MEALS AND AT BEDTIME) 120 tablet 0   triamcinolone cream (KENALOG) 0.1 % Apply 1 application topically 2 (two) times daily. Apply to area to left arm 30 g 0   No current facility-administered medications  for this visit.     Family History    Family History  Problem Relation Age of Onset   Hypertension Father    She indicated that her mother is deceased. She indicated that her father is alive. She indicated that her maternal grandfather is alive.  Social History    Social History   Socioeconomic History   Marital status: Single    Spouse name: Not on file   Number of children: Not on file   Years of education: Not on file   Highest education level: Not on file  Occupational History   Not on file  Tobacco Use   Smoking status: Former    Packs/day: 0.15    Types:  Cigarettes    Quit date: 09/10/2018    Years since quitting: 2.5   Smokeless tobacco: Never  Vaping Use   Vaping Use: Never used  Substance and Sexual Activity   Alcohol use: Yes    Comment: 1 glass beer per day   Drug use: No   Sexual activity: Not Currently  Other Topics Concern   Not on file  Social History Narrative   Not on file   Social Determinants of Health   Financial Resource Strain: Not on file  Food Insecurity: Not on file  Transportation Needs: Not on file  Physical Activity: Not on file  Stress: Not on file  Social Connections: Not on file  Intimate Partner Violence: Not on file     Review of Systems    General:  No chills, fever, night sweats or weight changes.  Cardiovascular:  No chest pain, dyspnea on exertion, edema, orthopnea, palpitations, paroxysmal nocturnal dyspnea. Dermatological: No rash, lesions/masses Respiratory: No cough, dyspnea Urologic: No hematuria, dysuria Abdominal:   No nausea, vomiting, diarrhea, bright red blood per rectum, melena, or hematemesis Neurologic:  No visual changes, wkns, changes in mental status. All other systems reviewed and are otherwise negative except as noted above.     Physical Exam    VS:  There were no vitals taken for this visit. , BMI There is no height or weight on file to calculate BMI.     GEN: Well nourished, well developed, in no acute distress. HEENT: normal. Neck: Supple, no JVD, carotid bruits, or masses. Cardiac: RRR, no murmurs, rubs, or gallops. No clubbing, cyanosis, edema.  Radials/DP/PT 2+ and equal bilaterally.  Respiratory:  Respirations regular and unlabored, clear to auscultation bilaterally. GI: Soft, nontender, nondistended, BS + x 4. MS: no deformity or atrophy. Skin: warm and dry, no rash. Neuro:  Strength and sensation are intact. Psych: Normal affect.  Accessory Clinical Findings    ECG personally reviewed by me today-  Not completed this office visit.   Lab Results   Component Value Date   WBC 6.1 10/02/2020   HGB 13.2 10/02/2020   HCT 39.7 10/02/2020   MCV 92.3 10/02/2020   PLT 272 10/02/2020   Lab Results  Component Value Date   CREATININE 0.84 10/02/2020   BUN 15 10/02/2020   NA 137 10/02/2020   K 3.2 (L) 10/02/2020   CL 96 (L) 10/02/2020   CO2 33 (H) 10/02/2020   Lab Results  Component Value Date   ALT 20 10/02/2020   AST 31 10/02/2020   ALKPHOS 58 10/02/2020   BILITOT 1.0 10/02/2020   Lab Results  Component Value Date   CHOL 261 (H) 04/05/2020   HDL 78 04/05/2020   LDLCALC 169 (H) 04/05/2020   LDLDIRECT 179 (H) 06/24/2019  TRIG 86 04/05/2020   CHOLHDL 3.3 04/05/2020    Lab Results  Component Value Date   HGBA1C 5.9 (H) 04/05/2020    Assessment & Plan   1.  Hypertension: Blood pressure is currently well controlled.  Noncompliance has been an issue for her concerning hypertensive issues.  She is aware that if she does not take her medications she will see a significant rise in her blood pressure.  She does take her blood pressure at home.  I reminded her to be consistent on taking her blood pressure medications so that they will not be any severe ups and downs.    She is confused on which medication she is taking concerning spironolactone versus chlorthalidone.  She will call us with which she is taking so that we know and we will be able to update her medication regimen.  BMET will be ordered today  2.  Hypercholesterolemia: Currently on atorvastatin.  We will check fasting lipids and LFTs along with a BMET today.    Current medicines are reviewed at length with the patient today.  I have spent 25  min's  dedicated to the care of this patient on the date of this encounter to include pre-visit review of records, assessment, management and diagnostic testing,with shared decision making. Signed, Phill Myron. West Pugh, ANP, [facility]   03/25/2021 7:13 AM    [place] [address] [place] [address] Office  [phone] Fax [phone]  Notice: This dictation was prepared with Dragon dictation along with smaller phrase technology. Any transcriptional errors that result from this process are unintentional and may not be corrected upon review.

## 2021-03-25 NOTE — Patient Instructions (Signed)
Medication Instructions:  CALL WITH SPIRONOLACTONE DETAILS  *If you need a refill on your cardiac medications before your next appointment, please call your pharmacy*  Lab Work:    LIPID, LFT, CBC TODAY       Follow-Up: Your next appointment:  3 month(s) In Person with Joni Reining, DNP  At [facility], you and your health needs are our priority.  As part of our continuing mission to provide you with exceptional heart care, we have created designated Provider Care Teams.  These Care Teams include your primary Cardiologist (physician) and Advanced Practice Providers (APPs -  Physician Assistants and Nurse Practitioners) who all work together to provide you with the care you need, when you need it.

## 2021-03-25 NOTE — Progress Notes (Signed)
Established Patient Office Visit  Subjective:  Patient ID: Wendy Jensen, female    DOB: [birth year]  Age: 61 y.o. MRN: [id number]  CC:  Chief Complaint  Patient presents with   Follow-up    HPI Wendy Jensen presents for follow up of hypertension. Patient denies acute complaints or concerns.   Past Medical History:  Diagnosis Date   GERD (gastroesophageal reflux disease)    Hypertension    Peptic ulcer    Reflux     Past Surgical History:  Procedure Laterality Date   COLONOSCOPY     ESOPHAGEAL DILATION     TUBAL LIGATION     UPPER GI ENDOSCOPY      Family History  Problem Relation Age of Onset   Hypertension Father     Social History   Socioeconomic History   Marital status: Single    Spouse name: Not on file   Number of children: Not on file   Years of education: Not on file   Highest education level: Not on file  Occupational History   Not on file  Tobacco Use   Smoking status: Former    Packs/day: 0.15    Types: Cigarettes    Quit date: 09/10/2018    Years since quitting: 2.5   Smokeless tobacco: Never  Vaping Use   Vaping Use: Never used  Substance and Sexual Activity   Alcohol use: Yes    Comment: 1 glass beer per day   Drug use: No   Sexual activity: Not Currently  Other Topics Concern   Not on file  Social History Narrative   Not on file   Social Determinants of Health   Financial Resource Strain: Not on file  Food Insecurity: Not on file  Transportation Needs: Not on file  Physical Activity: Not on file  Stress: Not on file  Social Connections: Not on file  Intimate Partner Violence: Not on file    ROS Review of Systems  All other systems reviewed and are negative.  Objective:   Today's Vitals: BP (!) 159/82    Pulse 81    Temp 97.8 F (36.6 C) (Oral)    Resp 16    Wt 153 lb 6.4 oz (69.6 kg)    SpO2 97%    BMI 26.33 kg/m   Physical Exam Vitals and nursing note reviewed.  Constitutional:      General: She is  not in acute distress. Cardiovascular:     Rate and Rhythm: Normal rate and regular rhythm.  Pulmonary:     Effort: Pulmonary effort is normal.     Breath sounds: Normal breath sounds.  Abdominal:     Palpations: Abdomen is soft.     Tenderness: There is no abdominal tenderness.  Musculoskeletal:     Right lower leg: No edema.     Left lower leg: No edema.  Neurological:     General: No focal deficit present.     Mental Status: She is alert and oriented to person, place, and time.    Assessment & Plan:   1. Uncontrolled hypertension Improved but  readings remain elevated. Will add spironolactone 25mg  daily to regimen. Referral to cardiology for further eval/mgt.  - Ambulatory referral to Cardiology    Outpatient Encounter Medications as of 03/24/2021  Medication Sig   amoxicillin-clavulanate (AUGMENTIN) 875-125 MG tablet Take 1 tablet by mouth every 12 (twelve) hours.   carvedilol (COREG) 25 MG tablet Take 1 tablet (25 mg total) by mouth 2 (two) times daily.  omeprazole (PRILOSEC) 20 MG capsule Take 2 tablets PO  in the morning for the next 14 days, then take 1 tablet PO once daily   rosuvastatin (CRESTOR) 20 MG tablet TAKE 1 TABLET(20 MG) BY MOUTH DAILY   sertraline (ZOLOFT) 50 MG tablet Take 1 tablet (50 mg total) by mouth daily.   spironolactone (ALDACTONE) 25 MG tablet Take 1 tablet (25 mg total) by mouth daily.   sucralfate (CARAFATE) 1 g tablet TAKE 1 TABLET(1 GRAM) BY MOUTH FOUR TIMES DAILY(WITH MEALS AND AT BEDTIME)   triamcinolone cream (KENALOG) 0.1 % Apply 1 application topically 2 (two) times daily. Apply to area to left arm   No facility-administered encounter medications on file as of 03/24/2021.    Follow-up: No follow-ups on file.   Tommie Raymond, MD

## 2021-03-26 LAB — LIPID PANEL
Chol/HDL Ratio: 2.7 ratio (ref 0.0–4.4)
Cholesterol, Total: 215 mg/dL — ABNORMAL HIGH (ref 100–199)
HDL: 79 mg/dL (ref >39)
LDL Chol Calc (NIH): 122 mg/dL — ABNORMAL HIGH (ref 0–99)
Triglycerides: 81 mg/dL (ref 0–149)
VLDL Cholesterol Cal: 14 mg/dL (ref 5–40)

## 2021-03-29 ENCOUNTER — Telehealth: Payer: Self-pay | Admitting: Cardiology

## 2021-03-29 NOTE — Telephone Encounter (Signed)
Pt c/o medication issue:  1. Name of Medication:  Carvedilol 6.25 MG twice a day  Chlorthalidone 25 MG once daily  spironolactone (ALDACTONE) 25 MG tablet  2. How are you currently taking this medication (dosage and times per day)? As prescribed   3. Are you having a reaction (difficulty breathing--STAT)? Yes  4. What is your medication issue? Patient is calling stating she was advised to call and report what medication she was taking that is not on her medication list which is chlorthalidone. Patient picked up her spironolactone today and took it, reports head has been pulsating since taking. BP this morning before work was 172/115, has not taken BP since.

## 2021-03-29 NOTE — Telephone Encounter (Signed)
Agree with plan 

## 2021-03-29 NOTE — Telephone Encounter (Signed)
Spoke with patient regarding medications and BP. She reports morning bp 172/115 with pulsating in the head. She took new prescription of spironolactone at 2:30 pm. She reports head and neck is pulsating with a bp of 183/110. Recommended patient to go to the ED. She state she would call her family to take her.

## 2021-03-30 ENCOUNTER — Telehealth: Payer: Self-pay

## 2021-03-30 NOTE — Telephone Encounter (Addendum)
Called patient regarding results. Left message.----- Message from Lendon Colonel, NP sent at 03/29/2021  5:45 PM EST ----- I have reviewed her labs. Elevated cholesterol > 200 and LDL greater than 100. Her  risk for having a heart attack is within her lifetime is 39%, , or heart score calculation of 3.8%. Optimal is 2.3%  Will need to start on Zetia 10 mg daily to lower her risk to optimal and recheck fasting Lipids and LFTs in 3 months. Continue to take her antihypertensive medications as directed.  Don't start back smoking. Waiting on the other labs to comment.   KL

## 2021-03-31 LAB — BASIC METABOLIC PANEL
BUN/Creatinine Ratio: 20 (ref 9–23)
BUN: 16 mg/dL (ref 6–24)
CO2: 30 mmol/L — ABNORMAL HIGH (ref 20–29)
Calcium: 9.8 mg/dL (ref 8.7–10.2)
Chloride: 90 mmol/L — ABNORMAL LOW (ref 96–106)
Creatinine, Ser: 0.82 mg/dL (ref 0.57–1.00)
Glucose: 131 mg/dL — ABNORMAL HIGH (ref 70–99)
Potassium: 3.6 mmol/L (ref 3.5–5.2)
Sodium: 138 mmol/L (ref 134–144)
eGFR: 82 mL/min/{1.73_m2} (ref >59)

## 2021-03-31 LAB — CBC
Hematocrit: 41.4 % (ref 34.0–46.6)
Hemoglobin: 14.3 g/dL (ref 11.1–15.9)
MCH: 31.2 pg (ref 26.6–33.0)
MCHC: 34.5 g/dL (ref 31.5–35.7)
MCV: 90 fL (ref 79–97)
Platelets: 311 10*3/uL (ref 150–450)
RBC: 4.59 x10E6/uL (ref 3.77–5.28)
RDW: 12.2 % (ref 11.7–15.4)
WBC: 6.9 10*3/uL (ref 3.4–10.8)

## 2021-03-31 LAB — HEPATIC FUNCTION PANEL
ALT: 22 IU/L (ref 0–32)
AST: 35 IU/L (ref 0–40)
Albumin: 4.9 g/dL (ref 3.8–4.9)
Alkaline Phosphatase: 90 IU/L (ref 44–121)
Bilirubin Total: 0.5 mg/dL (ref 0.0–1.2)
Bilirubin, Direct: 0.17 mg/dL (ref 0.00–0.40)
Total Protein: 8 g/dL (ref 6.0–8.5)

## 2021-04-04 ENCOUNTER — Telehealth: Payer: Self-pay

## 2021-04-04 NOTE — Telephone Encounter (Addendum)
Called patient regarding results. Unable to contact or leave voice message. Letter sent out today 04/04/21----- Message from Jodelle Gross, NP sent at 03/31/2021  7:56 PM EST ----- I have reviewed her labs.  Most values are okay.  Will still need to know if she is on  spironolactone or not.  Needs to be confirmed. No changes in her regimen.

## 2021-04-13 ENCOUNTER — Telehealth: Payer: Self-pay | Admitting: Adult Health

## 2021-04-13 NOTE — Telephone Encounter (Signed)
° °  Message from Jodelle Gross, NP sent at 03/29/2021  5:45 PM EST ----- I have reviewed her labs. Elevated cholesterol > 200 and LDL greater than 100. Her  risk for having a heart attack is within her lifetime is 39%, , or heart score calculation of 3.8%. Optimal is 2.3%  Will need to start on Zetia 10 mg daily to lower her risk to optimal and recheck fasting Lipids and LFTs in 3 months. Continue to take her antihypertensive medications as directed.  Don't start back smoking. Waiting on the other labs to comment.    KL

## 2021-04-13 NOTE — Telephone Encounter (Signed)
Patient called about her lab results.  Please call after 1:30pm.

## 2021-04-15 NOTE — Telephone Encounter (Signed)
Called patient regarding results. Left voicemail.

## 2021-05-02 ENCOUNTER — Other Ambulatory Visit: Payer: Self-pay | Admitting: Cardiology

## 2021-05-02 ENCOUNTER — Other Ambulatory Visit: Payer: Self-pay | Admitting: Family Medicine

## 2021-05-02 DIAGNOSIS — I10 Essential (primary) hypertension: Secondary | ICD-10-CM

## 2021-06-07 ENCOUNTER — Telehealth: Payer: Self-pay | Admitting: Family Medicine

## 2021-06-07 NOTE — Telephone Encounter (Signed)
Copied from CRM [id number]. Topic: Referral - Question ?>> Jun 07, 2021  9:24 AM Daphine Deutscher D wrote: ?Reason for CRM: Pt called asking to be referred to dermatology for the rash on her foot. ? ?CB#  [id number] ?

## 2021-06-09 ENCOUNTER — Ambulatory Visit: Payer: BC Managed Care – PPO | Admitting: Podiatry

## 2021-06-22 ENCOUNTER — Ambulatory Visit: Payer: BC Managed Care – PPO | Admitting: Family Medicine

## 2021-06-28 NOTE — Progress Notes (Deleted)
Cardiology Clinic Note   Patient Name: Wendy Jensen Date of Encounter: 06/28/2021  Primary Care Provider:  Georganna Skeans, MD Primary Cardiologist:  None  Patient Profile    61 year old female with history of hypertension on multiple antihypertensive medication, former tobacco abuse (quit 2021), peptic ulcer disease, hyperlipidemia recently started on Zetia.  Past Medical History    Past Medical History:  Diagnosis Date   GERD (gastroesophageal reflux disease)    Hypertension    Peptic ulcer    Reflux    Past Surgical History:  Procedure Laterality Date   COLONOSCOPY     ESOPHAGEAL DILATION     TUBAL LIGATION     UPPER GI ENDOSCOPY      Allergies  Allergies  Allergen Reactions   Amlodipine Benzoate Other (See Comments)    Patient reports this made her feel sick.   Amlodipine Besylate     Patient reports this makes her feel sick.   Hydralazine Other (See Comments)   Hydrochlorothiazide Other (See Comments)   Lisinopril Other (See Comments)    Patient reports this made her feel sick.   Losartan Other (See Comments)   Metoprolol Tartrate     Patient reports this makes her feel sick.    History of Present Illness    Wendy Jensen hand presents to the office today for ongoing assessment and management of hypertension and hyperlipidemia.  Last seen in the office on 04/13/2021 started on Zetia and remained on rosuvastatin 20 mg.  She was to be started on spironolactone but did not tolerate stating that it made her neck pulsate and her blood pressure increase.  Home Medications    Current Outpatient Medications  Medication Sig Dispense Refill   amoxicillin-clavulanate (AUGMENTIN) 875-125 MG tablet Take 1 tablet by mouth every 12 (twelve) hours. 14 tablet 0   carvedilol (COREG) 25 MG tablet Take 1 tablet (25 mg total) by mouth 2 (two) times daily. 180 tablet 3   carvedilol (COREG) 6.25 MG tablet TAKE 1 TABLET(6.25 MG) BY MOUTH TWICE DAILY WITH A MEAL 180 tablet 3    omeprazole (PRILOSEC) 20 MG capsule Take 2 tablets PO  in the morning for the next 14 days, then take 1 tablet PO once daily 42 capsule 2   rosuvastatin (CRESTOR) 20 MG tablet TAKE 1 TABLET(20 MG) BY MOUTH DAILY 90 tablet 0   sertraline (ZOLOFT) 50 MG tablet Take 1 tablet (50 mg total) by mouth daily. 30 tablet 3   spironolactone (ALDACTONE) 25 MG tablet Take 1 tablet (25 mg total) by mouth daily. 90 tablet 0   sucralfate (CARAFATE) 1 g tablet TAKE 1 TABLET(1 GRAM) BY MOUTH FOUR TIMES DAILY(WITH MEALS AND AT BEDTIME) 120 tablet 0   triamcinolone cream (KENALOG) 0.1 % Apply 1 application topically 2 (two) times daily. Apply to area to left arm 30 g 0   No current facility-administered medications for this visit.     Family History    Family History  Problem Relation Age of Onset   Hypertension Father    She indicated that her mother is deceased. She indicated that her father is alive. She indicated that her maternal grandfather is alive.  Social History    Social History   Socioeconomic History   Marital status: Single    Spouse name: Not on file   Number of children: Not on file   Years of education: Not on file   Highest education level: Not on file  Occupational History   Not on file  Tobacco Use   Smoking status: Former    Packs/day: 0.15    Types: Cigarettes    Quit date: 09/10/2018    Years since quitting: 2.8   Smokeless tobacco: Never  Vaping Use   Vaping Use: Never used  Substance and Sexual Activity   Alcohol use: Yes    Comment: 1 glass beer per day   Drug use: No   Sexual activity: Not Currently  Other Topics Concern   Not on file  Social History Narrative   Not on file   Social Determinants of Health   Financial Resource Strain: Not on file  Food Insecurity: Not on file  Transportation Needs: Not on file  Physical Activity: Not on file  Stress: Not on file  Social Connections: Not on file  Intimate Partner Violence: Not on file     Review of  Systems    General:  No chills, fever, night sweats or weight changes.  Cardiovascular:  No chest pain, dyspnea on exertion, edema, orthopnea, palpitations, paroxysmal nocturnal dyspnea. Dermatological: No rash, lesions/masses Respiratory: No cough, dyspnea Urologic: No hematuria, dysuria Abdominal:   No nausea, vomiting, diarrhea, bright red blood per rectum, melena, or hematemesis Neurologic:  No visual changes, wkns, changes in mental status. All other systems reviewed and are otherwise negative except as noted above.     Physical Exam    VS:  There were no vitals taken for this visit. , BMI There is no height or weight on file to calculate BMI.     GEN: Well nourished, well developed, in no acute distress. HEENT: normal. Neck: Supple, no JVD, carotid bruits, or masses. Cardiac: RRR, no murmurs, rubs, or gallops. No clubbing, cyanosis, edema.  Radials/DP/PT 2+ and equal bilaterally.  Respiratory:  Respirations regular and unlabored, clear to auscultation bilaterally. GI: Soft, nontender, nondistended, BS + x 4. MS: no deformity or atrophy. Skin: warm and dry, no rash. Neuro:  Strength and sensation are intact. Psych: Normal affect.  Accessory Clinical Findings    ECG personally reviewed by me today- *** - No acute changes  Lab Results  Component Value Date   WBC 6.9 03/30/2021   HGB 14.3 03/30/2021   HCT 41.4 03/30/2021   MCV 90 03/30/2021   PLT 311 03/30/2021   Lab Results  Component Value Date   CREATININE 0.82 03/30/2021   BUN 16 03/30/2021   NA 138 03/30/2021   K 3.6 03/30/2021   CL 90 (L) 03/30/2021   CO2 30 (H) 03/30/2021   Lab Results  Component Value Date   ALT 22 03/30/2021   AST 35 03/30/2021   ALKPHOS 90 03/30/2021   BILITOT 0.5 03/30/2021   Lab Results  Component Value Date   CHOL 215 (H) 03/25/2021   HDL 79 03/25/2021   LDLCALC 122 (H) 03/25/2021   LDLDIRECT 179 (H) 06/24/2019   TRIG 81 03/25/2021   CHOLHDL 2.7 03/25/2021    Lab Results   Component Value Date   HGBA1C 5.9 (H) 04/05/2020    Review of Prior Studies:   Assessment & Plan   1.  ***    Current medicines are reviewed at length with the patient today.  I have spent *** min's  dedicated to the care of this patient on the date of this encounter to include pre-visit review of records, assessment, management and diagnostic testing,with shared decision making. Signed, Bettey MareKathryn M. Liborio NixonLawrence DNP, ANP, AACC   06/28/2021 4:58 PM    [facility] [address]  [address] Office [phone] Fax [phone]  Notice: This dictation was prepared with Dragon dictation along with smaller phrase technology. Any transcriptional errors that result from this process are unintentional and may not be corrected upon review.

## 2021-07-01 ENCOUNTER — Ambulatory Visit: Payer: BC Managed Care – PPO | Admitting: Adult Health

## 2021-07-06 ENCOUNTER — Encounter: Payer: Self-pay | Admitting: Adult Health

## 2021-07-07 ENCOUNTER — Encounter: Payer: BC Managed Care – PPO | Admitting: Physician Assistant

## 2021-07-09 NOTE — Progress Notes (Signed)
This encounter was created in error - please disregard.

## 2021-07-12 ENCOUNTER — Encounter: Payer: Self-pay | Admitting: Physician Assistant

## 2021-07-13 ENCOUNTER — Encounter: Payer: Self-pay | Admitting: Physician Assistant

## 2021-07-13 ENCOUNTER — Ambulatory Visit (INDEPENDENT_AMBULATORY_CARE_PROVIDER_SITE_OTHER): Payer: BC Managed Care – PPO | Admitting: Physician Assistant

## 2021-07-13 VITALS — BP 156/85 | HR 87 | Temp 98.9°F | Resp 18 | Ht 64.0 in | Wt 149.0 lb

## 2021-07-13 DIAGNOSIS — I10 Essential (primary) hypertension: Secondary | ICD-10-CM | POA: Diagnosis not present

## 2021-07-13 DIAGNOSIS — I700 Atherosclerosis of aorta: Secondary | ICD-10-CM | POA: Diagnosis not present

## 2021-07-13 DIAGNOSIS — E782 Mixed hyperlipidemia: Secondary | ICD-10-CM | POA: Diagnosis not present

## 2021-07-13 NOTE — Patient Instructions (Addendum)
It is very important that you call us tomorrow and let us know what 3 medications you are taking. ? ?When I have that information I will make sure you have proper refills of all the medication that you need to your pharmacy. ? ?Please start checking your blood pressure at home on a daily basis, keep a written log and have that available for your next office visit with Dr. Redmond Pulling. ? ?Kennieth Rad, PA-C ?Physician Assistant ?[place] ?[email or web address] ? ? ?How to Take Your Blood Pressure ?Blood pressure is a measurement of how strongly your blood is pressing against the walls of your arteries. Arteries are blood vessels that carry blood from your heart throughout your body. Your health care provider takes your blood pressure at each office visit. You can also take your own blood pressure at home with a blood pressure monitor. ?You may need to take your own blood pressure to: ?Confirm a diagnosis of high blood pressure (hypertension). ?Monitor your blood pressure over time. ?Make sure your blood pressure medicine is working. ?Supplies needed: ?Blood pressure monitor. ?A chair to sit in. This should be a chair where you can sit upright with your back supported. Do not sit on a soft couch or an armchair. ?Table or desk. ?Small notebook and pencil or pen. ?How to prepare ?To get the most accurate reading, avoid the following for 30 minutes before you check your blood pressure: ?Drinking caffeine. ?Drinking alcohol. ?Eating. ?Smoking. ?Exercising. ?Five minutes before you check your blood pressure: ?Use the bathroom and urinate so that you have an empty bladder. ?Sit quietly in a chair. Do not talk. ?How to take your blood pressure ?To check your blood pressure, follow the instructions in the manual that came with your blood pressure monitor. If you have a digital blood pressure monitor, the instructions may be as follows: ?Sit up straight in a chair. ?Place  your feet on the floor. Do not cross your ankles or legs. ?Rest your left arm at the level of your heart on a table or desk or on the arm of a chair. ?Pull up your shirt sleeve. ?Wrap the blood pressure cuff around the upper part of your left arm, 1 inch (2.5 cm) above your elbow. It is best to wrap the cuff around bare skin. ?Fit the cuff snugly, but not too tightly, around your arm. You should be able to place only one finger between the cuff and your arm. ?Position the cord so that it rests in the bend of your elbow. ?Press the power button. ?Sit quietly while the cuff inflates and deflates. ?Read the digital reading on the monitor screen and write the numbers down (record them) in a notebook. ?Wait 2-3 minutes, then repeat the steps, starting at step 1. ?What does my blood pressure reading mean? ?A blood pressure reading consists of a higher number over a lower number. Ideally, your blood pressure should be below 120/80. The first ("top") number is called the systolic pressure. It is a measure of the pressure in your arteries as your heart beats. The second ("bottom") number is called the diastolic pressure. It is a measure of the pressure in your arteries as the heart relaxes. ?Blood pressure is classified into four stages. The following are the stages for adults who do not have a short-term serious illness or a chronic condition. Systolic pressure and diastolic pressure are measured in a unit called mm Hg (millimeters of mercury).  ?Normal ?Systolic pressure: below 123456. ?Diastolic pressure:  below 80. ?Elevated ?Systolic pressure: [id number]. ?Diastolic pressure: below 80. ?Hypertension stage 1 ?Systolic pressure: 0000000. ?Diastolic pressure: XX123456. ?Hypertension stage 2 ?Systolic pressure: XX123456 or above. ?Diastolic pressure: 90 or above. ?You can have elevated blood pressure or hypertension even if only the systolic or only the diastolic number in your reading is higher than normal. ?Follow these instructions at  home: ?Medicines ?Take over-the-counter and prescription medicines only as told by your health care provider. ?Tell your health care provider if you are having any side effects from blood pressure medicine. ?General instructions ?Check your blood pressure as often as recommended by your health care provider. ?Check your blood pressure at the same time every day. ?Take your monitor to the next appointment with your health care provider to make sure that: ?You are using it correctly. ?It provides accurate readings. ?Understand what your goal blood pressure numbers are. ?Keep all follow-up visits. This is important. ?General tips ?Your health care provider can suggest a reliable monitor that will meet your needs. There are several types of home blood pressure monitors. ?Choose a monitor that has an arm cuff. Do not choose a monitor that measures your blood pressure from your wrist or finger. ?Choose a cuff that wraps snugly, not too tight or too loose, around your upper arm. You should be able to fit only one finger between your arm and the cuff. ?You can buy a blood pressure monitor at most drugstores or online. ?Where to find more information ?American Heart Association: www.heart.org ?Contact a health care provider if: ?Your blood pressure is consistently high. ?Your blood pressure is suddenly low. ?Get help right away if: ?Your systolic blood pressure is higher than 180. ?Your diastolic blood pressure is higher than 120. ?These symptoms may be an emergency. Get help right away. Call 911. ?Do not wait to see if the symptoms will go away. ?Do not drive yourself to the hospital. ?Summary ?Blood pressure is a measurement of how strongly your blood is pressing against the walls of your arteries. ?A blood pressure reading consists of a higher number over a lower number. Ideally, your blood pressure should be below 120/80. ?Check your blood pressure at the same time every day. ?Avoid caffeine, alcohol, smoking, and  exercise for 30 minutes prior to checking your blood pressure. These agents can affect the accuracy of the blood pressure reading. ?This information is not intended to replace advice given to you by your health care provider. Make sure you discuss any questions you have with your health care provider. ?Document Revised: 11/11/2020 Document Reviewed: 11/11/2020 ?Elsevier Patient Education ? [place]. ? ?

## 2021-07-13 NOTE — Progress Notes (Signed)
? ?Established Patient Office Visit ? ?Subjective   ?Patient ID: Wendy Jensen, female    DOB: [birth year]  Age: 61 y.o. MRN: [id number] ? ?Chief Complaint  ?Patient presents with  ? Hypertension  ? ? ?States that she does not check her blood pressure at home, but does have access to a blood pressure cuff.  States that she does believe that she is taking carvedilol twice a day, states that she takes 3 different medications but is unsure of the other medications that she is taking. ? ?States that she does not think she is taking a cholesterol medication, she thinks that she has been out of this for "some time." ? ? ? ?Past Medical History:  ?Diagnosis Date  ? GERD (gastroesophageal reflux disease)   ? Hypertension   ? Peptic ulcer   ? Reflux   ? ?Social History  ? ?Socioeconomic History  ? Marital status: Single  ?  Spouse name: Not on file  ? Number of children: Not on file  ? Years of education: Not on file  ? Highest education level: Not on file  ?Occupational History  ? Not on file  ?Tobacco Use  ? Smoking status: Former  ?  Packs/day: 0.15  ?  Types: Cigarettes  ?  Quit date: 09/10/2018  ?  Years since quitting: 2.8  ? Smokeless tobacco: Never  ?Vaping Use  ? Vaping Use: Never used  ?Substance and Sexual Activity  ? Alcohol use: Yes  ?  Comment: 1 glass beer per day  ? Drug use: No  ? Sexual activity: Not Currently  ?Other Topics Concern  ? Not on file  ?Social History Narrative  ? Not on file  ? ?Social Determinants of Health  ? ?Financial Resource Strain: Not on file  ?Food Insecurity: Not on file  ?Transportation Needs: Not on file  ?Physical Activity: Not on file  ?Stress: Not on file  ?Social Connections: Not on file  ?Intimate Partner Violence: Not on file  ? ?Family History  ?Problem Relation Age of Onset  ? Hypertension Father   ? ?Allergies  ?Allergen Reactions  ? Amlodipine Benzoate Other (See Comments)  ?  Patient reports this made her feel sick.  ? Amlodipine Besylate   ?  Patient reports this  makes her feel sick.  ? Hydralazine Other (See Comments)  ? Hydrochlorothiazide Other (See Comments)  ? Lisinopril Other (See Comments)  ?  Patient reports this made her feel sick.  ? Losartan Other (See Comments)  ? Metoprolol Tartrate   ?  Patient reports this makes her feel sick.  ? ?  ? ?Review of Systems  ?Constitutional: Negative.   ?HENT: Negative.    ?Eyes: Negative.   ?Respiratory:  Negative for shortness of breath.   ?Cardiovascular:  Negative for chest pain.  ?Gastrointestinal: Negative.   ?Genitourinary: Negative.   ?Musculoskeletal: Negative.   ?Skin: Negative.   ?Neurological: Negative.   ?Endo/Heme/Allergies: Negative.   ?Psychiatric/Behavioral: Negative.    ? ?  ?Objective:  ?  ? ?BP (!) 156/85 (BP Location: Left Arm, Patient Position: Sitting, Cuff Size: Normal)   Pulse 87   Temp 98.9 ?F (37.2 ?C) (Oral)   Resp 18   Ht 5\' 4"  (1.626 m)   Wt 149 lb (67.6 kg)   SpO2 96%   BMI 25.58 kg/m?  ?Wt Readings from Last 3 Encounters:  ?07/13/21 149 lb (67.6 kg)  ?03/25/21 151 lb (68.5 kg)  ?03/24/21 153 lb 6.4 oz (69.6 kg)  ? ?  ? ?  Physical Exam ?Vitals and nursing note reviewed.  ?Constitutional:   ?   Appearance: Normal appearance.  ?HENT:  ?   Head: Normocephalic and atraumatic.  ?   Right Ear: External ear normal.  ?   Left Ear: External ear normal.  ?   Nose: Nose normal.  ?   Mouth/Throat:  ?   Mouth: Mucous membranes are moist.  ?   Pharynx: Oropharynx is clear.  ?Eyes:  ?   Extraocular Movements: Extraocular movements intact.  ?   Conjunctiva/sclera: Conjunctivae normal.  ?   Pupils: Pupils are equal, round, and reactive to light.  ?Cardiovascular:  ?   Rate and Rhythm: Normal rate and regular rhythm.  ?   Pulses: Normal pulses.  ?   Heart sounds: Normal heart sounds.  ?Pulmonary:  ?   Effort: Pulmonary effort is normal.  ?   Breath sounds: Normal breath sounds.  ?Musculoskeletal:     ?   General: Normal range of motion.  ?   Cervical back: Normal range of motion and neck supple.  ?Skin: ?    General: Skin is warm and dry.  ?Neurological:  ?   General: No focal deficit present.  ?   Mental Status: She is alert and oriented to person, place, and time.  ?Psychiatric:     ?   Mood and Affect: Mood normal.     ?   Behavior: Behavior normal.     ?   Thought Content: Thought content normal.     ?   Judgment: Judgment normal.  ? ? ? ?  ?Assessment & Plan:  ? ?Problem List Items Addressed This Visit   ? ?  ? Cardiovascular and Mediastinum  ? Essential hypertension - Primary  ? Relevant Medications  ? chlorthalidone (HYGROTON) 25 MG tablet  ? Aortic calcification (HCC)  ? Relevant Medications  ? chlorthalidone (HYGROTON) 25 MG tablet  ?  ? Other  ? Mixed hyperlipidemia  ? Relevant Medications  ? chlorthalidone (HYGROTON) 25 MG tablet  ?1. Essential hypertension ?Patient to call the office in the morning with list of medications that she is taking.  There is confusion around what medications she should be taking.  Will refill medications based on her response.  Patient encouraged to check blood pressure at home on a daily basis, keep a written log and have available for office visits.  Patient given a follow-up with her primary care provider in 1 month. ? ?2. Aortic calcification (HCC) ? ? ?3. Mixed hyperlipidemia ?Patient did have elevated cholesterol in January, cardiology checked this and assumed that she was taking the cholesterol medication at that time.  Patient does not think she is taking cholesterol medication.  Cardiology did start Zetia in addition to her current cholesterol medication, however Zetia is not on her medication list.  Will review medications when patient responds and refill as appropriate ? ? ?I have reviewed the patient's medical history (PMH, PSH, Social History, Family History, Medications, and allergies) , and have been updated if relevant. I spent 30 minutes reviewing chart and  face to face time with patient. ? ? ? ? ?Return in about 4 weeks (around 08/10/2021) for with Dr. Redmond Pulling at  [place].  ? ? ?Sadye Kiernan S Mayers, PA-C ? ?

## 2021-07-14 ENCOUNTER — Other Ambulatory Visit: Payer: Self-pay | Admitting: Family Medicine

## 2021-07-14 DIAGNOSIS — I10 Essential (primary) hypertension: Secondary | ICD-10-CM

## 2021-07-14 NOTE — Progress Notes (Signed)
?Cardiology Office Note:   ? ?Date:  07/15/2021  ? ?ID:  Wendy Jensen, DOB [birth year], MRN [id number] ? ?PCP:  Georganna Skeans, MD ?  ?[facility] Providers ?Cardiologist:  Little Ishikawa, MD ?Cardiology APP:  Marcelino Duster, [place] { ?Referring MD: Georganna Skeans, MD  ? ?Chief Complaint  ?Patient presents with  ? Follow-up  ?  HTN, HLD  ? ? ?History of Present Illness:   ? ?Wendy Jensen is a 61 y.o. female with a hx of hypertension, hyperlipidemia, peptic ulcer disease, and former smoker.  She has multiple drug allergies: Amlodipine, hydralazine, lisinopril, losartan, metoprolol, and HCTZ.  As such, blood pressure control has been challenging.  She is able to tolerate carvedilol and chlorthalidone.  She was last seen by Joni Reining, NP 03/25/2021 for ongoing treatment of her hypertension and hyperlipidemia. ? ?NP Lyman Bishop noted that spironolactone was listed on her med list instead of chlorthalidone, patient cannot remember what she was taking.  She had a period of 1 month of not taking her antihypertensives due to family problems.  She started taking her medications after a significant rise in her BP. ? ?Of note she saw her PCP yesterday and still did not know what medication she was taking.  I have called ahead and asked for help from triage to make sure she brings her medications with her to her appointment. ? ?She presents for follow up.  ?She brings her medications with her today which include; ?Coreg 6.25 mg BID ?Spironolactone 25 mg daily ?Chlorthalidone 25 mg daily ? ?BP today is 140/88 ?She is not checking at home. She will start on recommendation from PCP and me.  ? ?We reviewed all of the BP medications with listed allergies. We discussed that she may have felt unwell when starting amlodipine and hydralazine because her pressure was normalizing. She is willing to try a low dose amlodipine again. She takes spiro at night and does have nocturia, but this doesn't bother her. She  has tried to take spiro in the morning with chlorthalidone and coreg and she felt unwell.  ? ?She has had some acid reflux relieved with belching.  ? ? ?Past Medical History:  ?Diagnosis Date  ? GERD (gastroesophageal reflux disease)   ? Hypertension   ? Peptic ulcer   ? Reflux   ? ? ?Past Surgical History:  ?Procedure Laterality Date  ? COLONOSCOPY    ? ESOPHAGEAL DILATION    ? TUBAL LIGATION    ? UPPER GI ENDOSCOPY    ? ? ?Current Medications: ?Current Meds  ?Medication Sig  ? amLODipine (NORVASC) 2.5 MG tablet Take 1 tablet (2.5 mg total) by mouth daily.  ? carvedilol (COREG) 6.25 MG tablet TAKE 1 TABLET(6.25 MG) BY MOUTH TWICE DAILY WITH A MEAL  ? chlorthalidone (HYGROTON) 25 MG tablet Take 25 mg by mouth daily.  ? ezetimibe (ZETIA) 10 MG tablet Take 1 tablet (10 mg total) by mouth daily.  ? rosuvastatin (CRESTOR) 20 MG tablet Take 1 tablet (20 mg total) by mouth daily.  ? spironolactone (ALDACTONE) 25 MG tablet Take 1 tablet (25 mg total) by mouth daily.  ?  ? ?Allergies:   Amlodipine benzoate, Amlodipine besylate, Hydralazine, Hydrochlorothiazide, Lisinopril, Losartan, and Metoprolol tartrate  ? ?Social History  ? ?Socioeconomic History  ? Marital status: Single  ?  Spouse name: Not on file  ? Number of children: Not on file  ? Years of education: Not on file  ? Highest education level: Not on file  ?Occupational  History  ? Not on file  ?Tobacco Use  ? Smoking status: Former  ?  Packs/day: 0.15  ?  Types: Cigarettes  ?  Quit date: 09/10/2018  ?  Years since quitting: 2.8  ? Smokeless tobacco: Never  ?Vaping Use  ? Vaping Use: Never used  ?Substance and Sexual Activity  ? Alcohol use: Yes  ?  Comment: 1 glass beer per day  ? Drug use: No  ? Sexual activity: Not Currently  ?Other Topics Concern  ? Not on file  ?Social History Narrative  ? Not on file  ? ?Social Determinants of Health  ? ?Financial Resource Strain: Not on file  ?Food Insecurity: Not on file  ?Transportation Needs: Not on file  ?Physical Activity:  Not on file  ?Stress: Not on file  ?Social Connections: Not on file  ?  ? ?Family History: ?The patient's family history includes Hypertension in her father. ? ?ROS:   ?Please see the history of present illness.    ? All other systems reviewed and are negative. ? ?EKGs/Labs/Other Studies Reviewed:   ? ?The following studies were reviewed today: ? ?none ? ?EKG:  EKG is  ordered today.  The ekg ordered today demonstrates sinus rhythm HR 64, first degree heart block ? ?Recent Labs: ?03/30/2021: ALT 22; BUN 16; Creatinine, Ser 0.82; Hemoglobin 14.3; Platelets 311; Potassium 3.6; Sodium 138  ?Recent Lipid Panel ?   ?Component Value Date/Time  ? CHOL 215 (H) 03/25/2021 1508  ? TRIG 81 03/25/2021 1508  ? HDL 79 03/25/2021 1508  ? CHOLHDL 2.7 03/25/2021 1508  ? LDLCALC 122 (H) 03/25/2021 1508  ? LDLDIRECT 179 (H) 06/24/2019 1412  ? ? ? ?Risk Assessment/Calculations:   ?  ? ?    ? ?Physical Exam:   ? ?VS:  BP 140/88 (BP Location: Left Arm, Patient Position: Sitting, Cuff Size: Normal)   Pulse 64   Resp 20   Ht 5\' 4"  (1.626 m)   Wt 147 lb 3.2 oz (66.8 kg)   SpO2 98%   BMI 25.27 kg/m?    ? ?Wt Readings from Last 3 Encounters:  ?07/15/21 147 lb 3.2 oz (66.8 kg)  ?07/13/21 149 lb (67.6 kg)  ?03/25/21 151 lb (68.5 kg)  ?  ? ?GEN:  Well nourished, well developed in no acute distress ?HEENT: Normal ?NECK: No JVD; No carotid bruits ?LYMPHATICS: No lymphadenopathy ?CARDIAC: RRR, no murmurs, rubs, gallops ?RESPIRATORY:  Clear to auscultation without rales, wheezing or rhonchi  ?ABDOMEN: Soft, non-tender, non-distended ?MUSCULOSKELETAL:  No edema; No deformity  ?SKIN: Warm and dry ?NEUROLOGIC:  Alert and oriented x 3 ?PSYCHIATRIC:  Normal affect  ? ?ASSESSMENT:   ? ?1. Essential hypertension   ?2. Mixed hyperlipidemia   ? ?PLAN:   ? ?In order of problems listed above: ? ?Hypertension ?Maintained on 25 mg chlorthalidone, 25 mg spironolactone, and 6.25 mg coreg ?- will add 2.5 mg amlodipine at night ? ? ?Hyperlipidemia ?03/25/2021:  Cholesterol, Total 215; HDL 79; LDL Chol Calc (NIH) 122; Triglycerides 81 ?Risk for having a heart attack is within her lifetime is 39%, or heart score calculation of 3.8%. Optimal is 2.3%   ?- she states she has been out of 20 mg crestor ?- zetia was recommended, but was not started ?- she reports that she was unable to reach anyone after last visit ?- I will restart crestor today and start zetia 10 mg 2 weeks after starting amlodipine ?- she is very frustrated that no one called her or refilled  her crestor, unclear what happened ? ? ? ? ?Medication Adjustments/Labs and Tests Ordered: ?Current medicines are reviewed at length with the patient today.  Concerns regarding medicines are outlined above.  ?Orders Placed This Encounter  ?Procedures  ? EKG 12-Lead  ? ?Meds ordered this encounter  ?Medications  ? amLODipine (NORVASC) 2.5 MG tablet  ?  Sig: Take 1 tablet (2.5 mg total) by mouth daily.  ?  Dispense:  30 tablet  ?  Refill:  0  ? ezetimibe (ZETIA) 10 MG tablet  ?  Sig: Take 1 tablet (10 mg total) by mouth daily.  ?  Dispense:  90 tablet  ?  Refill:  3  ? rosuvastatin (CRESTOR) 20 MG tablet  ?  Sig: Take 1 tablet (20 mg total) by mouth daily.  ?  Dispense:  90 tablet  ?  Refill:  3  ? ? ?Patient Instructions  ?Medication Instructions:  ?Starting taking tonight,  Crestor 20 mg daily  ?Start taking Amlodipine 2.5 mg in the morning for 2 weeks(New Blood Pressure Medication) then start Zetia 10 mg daily (new Cholesterol Medication)   ? ?*If you need a refill on your cardiac medications before your next appointment, please call your pharmacy* ? ? ?Lab Work: ?NONE ordered at this time of appointment  ? ?If you have labs (blood work) drawn today and your tests are completely normal, you will receive your results only by: ?MyChart Message (if you have MyChart) OR ?A paper copy in the mail ?If you have any lab test that is abnormal or we need to change your treatment, we will call you to review the  results. ? ? ?Testing/Procedures: ?NONE ordered at this time of appointment  ? ? ? ?Follow-Up: ?[facility], you and your health needs are our priority.  As part of our continuing mission to provide you with exceptional heart

## 2021-07-14 NOTE — Telephone Encounter (Signed)
Requested medication (s) are due for refill today:   Cari Mayers, PA requested pt call in her medications due to confusion over which ones she is taking.  Some prescribed by other providers or are historical. ? ?Requested medication (s) are on the active medication list:   Yes for all of them ? ?Future visit scheduled:   No   Seen yesterday 5/3 by Carrolyn Meiers ? ? ?Last ordered: Coreg 05/04/2021 #180, 3 refills by Dr. Oswaldo Milian with [facility] [address]; ?[place] 05/02/2021 historical provider. ?Aldactone 03/24/2021 #90, 0 refills ? ?Returned for review by Carrolyn Meiers at her request per her OV note.  ? ?Requested Prescriptions  ?Pending Prescriptions Disp Refills  ? carvedilol (COREG) 6.25 MG tablet 180 tablet 3  ?  ? Cardiovascular: Beta Blockers 3 Failed - 07/14/2021 11:23 AM  ?  ?  Failed - Last BP in normal range  ?  BP Readings from Last 1 Encounters:  ?07/13/21 (!) 156/85  ?  ?  ?  ?  Passed - Cr in normal range and within 360 days  ?  Creatinine, Ser  ?Date Value Ref Range Status  ?03/30/2021 0.82 0.57 - 1.00 mg/dL Final  ?  ?  ?  ?  Passed - AST in normal range and within 360 days  ?  AST  ?Date Value Ref Range Status  ?03/30/2021 35 0 - 40 IU/L Final  ?  ?  ?  ?  Passed - ALT in normal range and within 360 days  ?  ALT  ?Date Value Ref Range Status  ?03/30/2021 22 0 - 32 IU/L Final  ?  ?  ?  ?  Passed - Last Heart Rate in normal range  ?  Pulse Readings from Last 1 Encounters:  ?07/13/21 87  ?  ?  ?  ?  Passed - Valid encounter within last 6 months  ?  Recent Outpatient Visits   ? ?      ? Yesterday Essential hypertension  ? Primary Care at [place], PA-C  ? 3 months ago Uncontrolled hypertension  ? Primary Care at [facility], MD  ? 4 months ago Essential hypertension  ? Primary Care at [facility], MD  ? 5 months ago Gastroesophageal reflux disease, unspecified whether esophagitis present  ? Primary Care at [facility], MD  ? 7 months ago Gastroesophageal reflux disease, unspecified whether esophagitis present  ? Primary Care at [facility], Kriste Basque, NP  ? ?  ?  ?Future Appointments   ? ?        ? Tomorrow [facility], Tami Lin, PA [facility]  ? ?  ? ? ?  ?  ?  ? chlorthalidone (HYGROTON) 25 MG tablet    ?  Sig: Take 1 tablet (25 mg total) by mouth daily.  ?  ? Cardiovascular: Diuretics - Thiazide Failed - 07/14/2021 11:23 AM  ?  ?  Failed - Last BP in normal range  ?  BP Readings from Last 1 Encounters:  ?07/13/21 (!) 156/85  ?  ?  ?  ?  Passed - Cr in normal range and within 180 days  ?  Creatinine, Ser  ?Date Value Ref Range Status  ?03/30/2021 0.82 0.57 - 1.00 mg/dL Final  ?  ?  ?  ?  Passed - K in normal range and within 180 days  ?  Potassium  ?Date Value Ref  Range Status  ?03/30/2021 3.6 3.5 - 5.2 mmol/L Final  ?  ?  ?  ?  Passed - Na in normal range and within 180 days  ?  Sodium  ?Date Value Ref Range Status  ?03/30/2021 138 134 - 144 mmol/L Final  ?  ?  ?  ?  Passed - Valid encounter within last 6 months  ?  Recent Outpatient Visits   ? ?      ? Yesterday Essential hypertension  ? Primary Care at [place], PA-C  ? 3 months ago Uncontrolled hypertension  ? Primary Care at [facility], MD  ? 4 months ago Essential hypertension  ? Primary Care at [facility], MD  ? 5 months ago Gastroesophageal reflux disease, unspecified whether esophagitis present  ? Primary Care at [facility], MD  ? 7 months ago Gastroesophageal reflux disease, unspecified whether esophagitis present  ? Primary Care at [facility], Kriste Basque, NP  ? ?  ?  ?Future Appointments   ? ?        ? Tomorrow [facility], Tami Lin, PA [facility]  ? ?  ? ? ?  ?  ?  ? spironolactone (ALDACTONE) 25 MG tablet 90 tablet 0  ?  Sig: Take 1 tablet (25 mg total) by mouth daily.  ?  ? Cardiovascular: Diuretics - Aldosterone Antagonist  Failed - 07/14/2021 11:23 AM  ?  ?  Failed - Last BP in normal range  ?  BP Readings from Last 1 Encounters:  ?07/13/21 (!) 156/85  ?  ?  ?  ?  Passed - Cr in normal range and within 180 days  ?  Creatinine, Ser  ?Date Value Ref Range Status  ?03/30/2021 0.82 0.57 - 1.00 mg/dL Final  ?  ?  ?  ?  Passed - K in normal range and within 180 days  ?  Potassium  ?Date Value Ref Range Status  ?03/30/2021 3.6 3.5 - 5.2 mmol/L Final  ?  ?  ?  ?  Passed - Na in normal range and within 180 days  ?  Sodium  ?Date Value Ref Range Status  ?03/30/2021 138 134 - 144 mmol/L Final  ?  ?  ?  ?  Passed - eGFR is 30 or above and within 180 days  ?  GFR calc Af Amer  ?Date Value Ref Range Status  ?04/05/2020 93 >59 mL/min/1.73 Final  ?  Comment:  ?  **In accordance with recommendations from the NKF-ASN Task force,** ?  [facility] is in the process of updating its eGFR calculation to the ?  2021 CKD-EPI creatinine equation that estimates kidney function ?  without a race variable. ?  ? ?GFR, Estimated  ?Date Value Ref Range Status  ?10/02/2020 >60 >60 mL/min Final  ?  Comment:  ?  (NOTE) ?Calculated using the CKD-EPI Creatinine Equation (2021) ?  ? ?eGFR  ?Date Value Ref Range Status  ?03/30/2021 82 >59 mL/min/1.73 Final  ?  ?  ?  ?  Passed - Valid encounter within last 6 months  ?  Recent Outpatient Visits   ? ?      ? Yesterday Essential hypertension  ? Primary Care at [place], PA-C  ? 3 months ago Uncontrolled hypertension  ? Primary Care at [facility], MD  ? 4 months ago Essential hypertension  ? Primary Care at [facility],  Clyde Canterbury, MD  ? 5 months ago Gastroesophageal reflux disease, unspecified whether esophagitis present  ? Primary Care at [facility], MD  ? 7 months ago Gastroesophageal reflux disease, unspecified whether esophagitis present  ? Primary Care at [facility], Kriste Basque, NP  ? ?  ?  ?Future Appointments   ? ?        ? Tomorrow [facility], Tami Lin,  PA [facility]  ? ?  ? ? ?  ?  ?  ? ?

## 2021-07-14 NOTE — Telephone Encounter (Signed)
Pt was advised by Maurene Capes to call with her med list to get refills / pt needs refills for carvedilol (COREG) 6.25 MG  ? ?chlorthalidone (HYGROTON) 25 MG tablet ? ?spironolactone (ALDACTONE) 25 MG tablet ? ?[facility] [phone] [place], [place] [phone] [facility] [address] AT [facility] OF [address] & [address]  ?[address] [place] [address]  ?Phone:  [phone]  Fax:  [phone]  ?

## 2021-07-14 NOTE — Telephone Encounter (Signed)
Please see patients medications and refill if appropriate to continue.

## 2021-07-15 ENCOUNTER — Encounter: Payer: Self-pay | Admitting: Physician Assistant

## 2021-07-15 ENCOUNTER — Ambulatory Visit (INDEPENDENT_AMBULATORY_CARE_PROVIDER_SITE_OTHER): Payer: BC Managed Care – PPO | Admitting: Physician Assistant

## 2021-07-15 VITALS — BP 140/88 | HR 64 | Resp 20 | Ht 64.0 in | Wt 147.2 lb

## 2021-07-15 DIAGNOSIS — E782 Mixed hyperlipidemia: Secondary | ICD-10-CM | POA: Diagnosis not present

## 2021-07-15 DIAGNOSIS — I10 Essential (primary) hypertension: Secondary | ICD-10-CM

## 2021-07-15 MED ORDER — EZETIMIBE 10 MG PO TABS: 10.0000 mg | ORAL_TABLET | Freq: Every day | ORAL | 3 refills | Status: DC

## 2021-07-15 MED ORDER — AMLODIPINE BESYLATE 2.5 MG PO TABS: 2.5000 mg | ORAL_TABLET | Freq: Every day | ORAL | 0 refills | Status: DC

## 2021-07-15 MED ORDER — ROSUVASTATIN CALCIUM 20 MG PO TABS: 20.0000 mg | ORAL_TABLET | Freq: Every day | ORAL | 3 refills | Status: DC

## 2021-07-15 NOTE — Patient Instructions (Addendum)
Medication Instructions:  ?Starting taking tonight,  Crestor 20 mg daily  ?Start taking Amlodipine 2.5 mg in the morning for 2 weeks(New Blood Pressure Medication) then start Zetia 10 mg daily (new Cholesterol Medication)   ? ?*If you need a refill on your cardiac medications before your next appointment, please call your pharmacy* ? ? ?Lab Work: ?NONE ordered at this time of appointment  ? ?If you have labs (blood work) drawn today and your tests are completely normal, you will receive your results only by: ?MyChart Message (if you have MyChart) OR ?A paper copy in the mail ?If you have any lab test that is abnormal or we need to change your treatment, we will call you to review the results. ? ? ?Testing/Procedures: ?NONE ordered at this time of appointment  ? ? ? ?Follow-Up: ?At [facility], you and your health needs are our priority.  As part of our continuing mission to provide you with exceptional heart care, we have created designated Provider Care Teams.  These Care Teams include your primary Cardiologist (physician) and Advanced Practice Providers (APPs -  Physician Assistants and Nurse Practitioners) who all work together to provide you with the care you need, when you need it. ? ?We recommend signing up for the patient portal called "MyChart".  Sign up information is provided on this After Visit Summary.  MyChart is used to connect with patients for Virtual Visits (Telemedicine).  Patients are able to view lab/test results, encounter notes, upcoming appointments, etc.  Non-urgent messages can be sent to your provider as well.   ?To learn more about what you can do with MyChart, go to [email or web address].   ? ?Your next appointment:   ?3 month(s) ? ?The format for your next appointment:   ?In Person ? ?Provider:   ?Fabian Sharp, PA-C or Jory Sims, DNP, ANP      ? ? ?Other Instructions ? ? ?Important Information About Sugar ? ? ? ? ? ? ?

## 2021-07-18 ENCOUNTER — Other Ambulatory Visit: Payer: Self-pay | Admitting: Physician Assistant

## 2021-07-18 DIAGNOSIS — I10 Essential (primary) hypertension: Secondary | ICD-10-CM

## 2021-07-18 MED ORDER — SPIRONOLACTONE 25 MG PO TABS: 25.0000 mg | ORAL_TABLET | Freq: Every day | ORAL | 1 refills | Status: DC

## 2021-07-18 MED ORDER — CHLORTHALIDONE 25 MG PO TABS: 25.0000 mg | ORAL_TABLET | Freq: Every day | ORAL | 1 refills | Status: DC

## 2021-08-03 ENCOUNTER — Ambulatory Visit: Payer: BC Managed Care – PPO | Admitting: Family Medicine

## 2021-08-11 ENCOUNTER — Other Ambulatory Visit: Payer: Self-pay

## 2021-08-12 ENCOUNTER — Other Ambulatory Visit: Payer: Self-pay | Admitting: Family Medicine

## 2021-08-12 ENCOUNTER — Other Ambulatory Visit: Payer: Self-pay | Admitting: *Deleted

## 2021-08-12 ENCOUNTER — Other Ambulatory Visit: Payer: Self-pay | Admitting: Cardiology

## 2021-08-12 DIAGNOSIS — I10 Essential (primary) hypertension: Secondary | ICD-10-CM

## 2021-08-12 NOTE — Telephone Encounter (Signed)
Summary: rx follow up   The patient would like to speak with a member of clinical staff about their carvedilol (COREG) 6.25 MG tablet [[id number]] prescription   The medication refill request was previously denied   Please contact when available       Called patient to review medication refill. Patient reports she has lost her bottle of medication coreg 6.25 mg . Patient is out of medication and requesting refill. Please advise

## 2021-08-12 NOTE — Telephone Encounter (Signed)
Pt called in states she is out of her medication, Carvedilol 12.5 MG. Request was just put in today. Please send short supply if possible.  [facility] [phone] - [place], [place] - [phone] [address] AT [facility] OF [address] & [facility] Phone:  [phone]  Fax:  [phone]

## 2021-08-12 NOTE — Telephone Encounter (Signed)
Last prescribed by heart doctor Dr. Jacelyn Pi 05/04/21 Requested Prescriptions  Refused Prescriptions Disp Refills  . carvedilol (COREG) 6.25 MG tablet 180 tablet 3     Cardiovascular: Beta Blockers 3 Failed - 08/12/2021 11:23 AM      Failed - Last BP in normal range    BP Readings from Last 1 Encounters:  07/15/21 140/88         Passed - Cr in normal range and within 360 days    Creatinine, Ser  Date Value Ref Range Status  03/30/2021 0.82 0.57 - 1.00 mg/dL Final         Passed - AST in normal range and within 360 days    AST  Date Value Ref Range Status  03/30/2021 35 0 - 40 IU/L Final         Passed - ALT in normal range and within 360 days    ALT  Date Value Ref Range Status  03/30/2021 22 0 - 32 IU/L Final         Passed - Last Heart Rate in normal range    Pulse Readings from Last 1 Encounters:  07/15/21 64         Passed - Valid encounter within last 6 months    Recent Outpatient Visits          1 month ago Essential hypertension   Primary Care at [facility], Cari S, PA-C   4 months ago Uncontrolled hypertension   Primary Care at [facility], MD   4 months ago Essential hypertension   Primary Care at [facility] Lauris Poag, MD   6 months ago Gastroesophageal reflux disease, unspecified whether esophagitis present   Primary Care at [facility], MD   8 months ago Gastroesophageal reflux disease, unspecified whether esophagitis present   Primary Care at [facility], Gildardo Pounds, NP      Future Appointments            In 2 months Lyman Bishop, Bettey Mare, NP [facility], [facility]

## 2021-08-12 NOTE — Telephone Encounter (Signed)
Medication Refill - Medication:  carvedilol (COREG) 6.25 MG tablet   Has the patient contacted their pharmacy? Yes.   Contact PCP  Preferred Pharmacy (with phone number or street name):  [facility] [phone] [place], [place] [phone] AT [facility] OF [address] & [address]  [address] [place] [address]  Phone:  [phone]  Fax:  [phone]   Has the patient been seen for an appointment in the last year OR does the patient have an upcoming appointment? Yes.    Agent: Please be advised that RX refills may take up to 3 business days. We ask that you follow-up with your pharmacy.

## 2021-08-15 ENCOUNTER — Other Ambulatory Visit: Payer: Self-pay | Admitting: Cardiology

## 2021-10-19 NOTE — Progress Notes (Signed)
Cardiology Clinic Note   Patient Name: Wendy Jensen Date of Encounter: 10/21/2021  Primary Care Provider:  Georganna Skeans, MD Primary Cardiologist:  Little Ishikawa, MD  Patient Profile    Wendy Jensen is a 61 y.o. female with a hx of hypertension, hyperlipidemia, peptic ulcer disease, and former smoker.  She has multiple drug allergies: Amlodipine, hydralazine, lisinopril, losartan, metoprolol, and HCTZ.  As such, blood pressure control has been challenging.  She is able to tolerate carvedilol and chlorthalidone.  She was last seen by Micah Flesher, PA for hypertension and amlodipine 2.5mg  at [facility] was prescribed. She was to continue chlorthalidone, spironolactone and coreg.   Past Medical History    Past Medical History:  Diagnosis Date   GERD (gastroesophageal reflux disease)    Hypertension    Peptic ulcer    Reflux    Past Surgical History:  Procedure Laterality Date   COLONOSCOPY     ESOPHAGEAL DILATION     TUBAL LIGATION     UPPER GI ENDOSCOPY      Allergies  Allergies  Allergen Reactions   Amlodipine Benzoate Other (See Comments)    Patient reports this made her feel sick.   Amlodipine Besylate     Patient reports this makes her feel sick.   Hydralazine Other (See Comments)   Hydrochlorothiazide Other (See Comments)   Lisinopril Other (See Comments)    Patient reports this made her feel sick.   Losartan Other (See Comments)   Metoprolol Tartrate     Patient reports this makes her feel sick.    History of Present Illness    [name] returns today to evaluate blood pressure control.  She was unable to tolerate amlodipine.  This was also a part of her list of allergies.  It caused her to have palpitations and chest pressure.  She stopped taking it on her own because of this.  She has gone to a no salt diet, has begun to be more active, and blood pressures been better controlled at home.  She states that she feels well and is continuing  on spironolactone 25 mg daily, carvedilol 6.25 mg twice daily and chlorthalidone. .   Home Medications    Current Outpatient Medications  Medication Sig Dispense Refill   amLODipine (NORVASC) 2.5 MG tablet Take 1 tablet (2.5 mg total) by mouth daily. 30 tablet 0   ezetimibe (ZETIA) 10 MG tablet Take 1 tablet (10 mg total) by mouth daily. 90 tablet 3   carvedilol (COREG) 6.25 MG tablet TAKE 1 TABLET(6.25 MG) BY MOUTH TWICE DAILY WITH A MEAL 180 tablet 3   rosuvastatin (CRESTOR) 20 MG tablet Take 1 tablet (20 mg total) by mouth daily. 90 tablet 3   spironolactone (ALDACTONE) 25 MG tablet Take 1 tablet (25 mg total) by mouth daily. 90 tablet 1   No current facility-administered medications for this visit.     Family History    Family History  Problem Relation Age of Onset   Hypertension Father    She indicated that her mother is deceased. She indicated that her father is alive. She indicated that her maternal grandfather is alive.  Social History    Social History   Socioeconomic History   Marital status: Single    Spouse name: Not on file   Number of children: Not on file   Years of education: Not on file   Highest education level: Not on file  Occupational History   Not on file  Tobacco Use  Smoking status: Former    Packs/day: 0.15    Types: Cigarettes    Quit date: 09/10/2018    Years since quitting: 3.1   Smokeless tobacco: Never  Vaping Use   Vaping Use: Never used  Substance and Sexual Activity   Alcohol use: Yes    Comment: 1 glass beer per day   Drug use: No   Sexual activity: Not Currently  Other Topics Concern   Not on file  Social History Narrative   Not on file   Social Determinants of Health   Financial Resource Strain: Not on file  Food Insecurity: Not on file  Transportation Needs: Not on file  Physical Activity: Not on file  Stress: Not on file  Social Connections: Not on file  Intimate Partner Violence: Not on file     Review of Systems     General:  No chills, fever, night sweats or weight changes.  Cardiovascular:  No chest pain, dyspnea on exertion, edema, orthopnea, palpitations, paroxysmal nocturnal dyspnea. Dermatological: No rash, lesions/masses Respiratory: No cough, dyspnea Urologic: No hematuria, dysuria Abdominal:   No nausea, vomiting, diarrhea, bright red blood per rectum, melena, or hematemesis Neurologic:  No visual changes, wkns, changes in mental status. All other systems reviewed and are otherwise negative except as noted above.     Physical Exam    VS:  BP 132/80   Pulse 66   Ht 5\' 4"  (1.626 m)   Wt 152 lb 12.8 oz (69.3 kg)   SpO2 96%   BMI 26.23 kg/m  , BMI Body mass index is 26.23 kg/m.     GEN: Well nourished, well developed, in no acute distress. HEENT: normal. Neck: Supple, no JVD, carotid bruits, or masses. Cardiac: RRR, no murmurs, rubs, or gallops. No clubbing, cyanosis, edema.  Radials/DP/PT 2+ and equal bilaterally.  Respiratory:  Respirations regular and unlabored, clear to auscultation bilaterally. GI: Soft, nontender, nondistended, BS + x 4. MS: no deformity or atrophy. Skin: warm and dry, no rash. Neuro:  Strength and sensation are intact. Psych: Normal affect.  Accessory Clinical Findings    ECG personally reviewed by me today-not completed this office visit.  Lab Results  Component Value Date   WBC 6.9 03/30/2021   HGB 14.3 03/30/2021   HCT 41.4 03/30/2021   MCV 90 03/30/2021   PLT 311 03/30/2021   Lab Results  Component Value Date   CREATININE 0.82 03/30/2021   BUN 16 03/30/2021   NA 138 03/30/2021   K 3.6 03/30/2021   CL 90 (L) 03/30/2021   CO2 30 (H) 03/30/2021   Lab Results  Component Value Date   ALT 22 03/30/2021   AST 35 03/30/2021   ALKPHOS 90 03/30/2021   BILITOT 0.5 03/30/2021   Lab Results  Component Value Date   CHOL 215 (H) 03/25/2021   HDL 79 03/25/2021   LDLCALC 122 (H) 03/25/2021   LDLDIRECT 179 (H) 06/24/2019   TRIG 81 03/25/2021    CHOLHDL 2.7 03/25/2021    Lab Results  Component Value Date   HGBA1C 5.9 (H) 04/05/2020    Review of Prior Studies:   Assessment & Plan   1.  Hypertension: Patient's blood pressure is well controlled currently on carvedilol 6.25 mg twice daily spironolactone 25 mg daily and chlorthalidone 25 mg daily.  She will need to have a BMET to evaluate kidney function and potassium status on spironolactone.  Most recent labs were drawn in January 2023.  She was unable to tolerate amlodipine.  This is an allergy for her.  No changes in her regimen at this time.  Consider echocardiogram as we do not have records of a recent echo completed in the past.  This will evaluate her LV systolic function with known history of hypertension.  2.  Hypercholesterolemia: She remains on rosuvastatin 20 mg daily.  With follow-up labs I will have her have fasting lipids and LFTs.  As she is seen late in the day these labs will have to be drawn on a different day and at her convenience.  Goal of LDL less than 70.  Continue Zetia as well.  Current medicines are reviewed at length with the patient today.  I have spent 25 min's  dedicated to the care of this patient on the date of this encounter to include pre-visit review of records, assessment, management and diagnostic testing,with shared decision making.   Signed, Bettey Mare. Liborio Nixon, ANP, AACC   10/21/2021 4:21 PM      Office [phone] Fax [phone]  Notice: This dictation was prepared with Dragon dictation along with smaller phrase technology. Any transcriptional errors that result from this process are unintentional and may not be corrected upon review.

## 2021-10-21 ENCOUNTER — Ambulatory Visit (INDEPENDENT_AMBULATORY_CARE_PROVIDER_SITE_OTHER): Payer: BC Managed Care – PPO | Admitting: Adult Health

## 2021-10-21 ENCOUNTER — Encounter: Payer: Self-pay | Admitting: Adult Health

## 2021-10-21 ENCOUNTER — Telehealth: Payer: Self-pay

## 2021-10-21 DIAGNOSIS — E782 Mixed hyperlipidemia: Secondary | ICD-10-CM

## 2021-10-21 DIAGNOSIS — I10 Essential (primary) hypertension: Secondary | ICD-10-CM

## 2021-10-21 MED ORDER — ROSUVASTATIN CALCIUM 20 MG PO TABS: 20.0000 mg | ORAL_TABLET | Freq: Every day | ORAL | 3 refills | Status: DC

## 2021-10-21 MED ORDER — SPIRONOLACTONE 25 MG PO TABS: 25.0000 mg | ORAL_TABLET | Freq: Every day | ORAL | 1 refills | Status: DC

## 2021-10-21 MED ORDER — CARVEDILOL 6.25 MG PO TABS: ORAL_TABLET | ORAL | 3 refills | Status: DC

## 2021-10-21 NOTE — Patient Instructions (Signed)
Medication Instructions:  °No Changes °*If you need a refill on your cardiac medications before your next appointment, please call your pharmacy* ° ° °Lab Work: °No labs °If you have labs (blood work) drawn today and your tests are completely normal, you will receive your results only by: °MyChart Message (if you have MyChart) OR °A paper copy in the mail °If you have any lab test that is abnormal or we need to change your treatment, we will call you to review the results. ° ° °Testing/Procedures: °No Testing ° ° °Follow-Up: °At [facility], you and your health needs are our priority.  As part of our continuing mission to provide you with exceptional heart care, we have created designated Provider Care Teams.  These Care Teams include your primary Cardiologist (physician) and Advanced Practice Providers (APPs -  Physician Assistants and Nurse Practitioners) who all work together to provide you with the care you need, when you need it. ° °We recommend signing up for the patient portal called "MyChart".  Sign up information is provided on this After Visit Summary.  MyChart is used to connect with patients for Virtual Visits (Telemedicine).  Patients are able to view lab/test results, encounter notes, upcoming appointments, etc.  Non-urgent messages can be sent to your provider as well.   °To learn more about what you can do with MyChart, go to [email or web address].   ° °Your next appointment:   °6 month(s) ° °The format for your next appointment:   °In Person ° °Provider:   °Christopher L Schumann, MD   ° ° °  °

## 2021-10-21 NOTE — Telephone Encounter (Signed)
Called patient to advise per K. Lawrence will schedule echocardiogram. Patient will come in next week to have fasting Lipid Panel and CMET.

## 2021-10-26 ENCOUNTER — Other Ambulatory Visit: Payer: Self-pay | Admitting: *Deleted

## 2021-10-26 ENCOUNTER — Other Ambulatory Visit: Payer: Self-pay

## 2021-10-26 ENCOUNTER — Ambulatory Visit (HOSPITAL_COMMUNITY): Payer: BC Managed Care – PPO | Attending: Adult Health

## 2021-10-26 DIAGNOSIS — E782 Mixed hyperlipidemia: Secondary | ICD-10-CM

## 2021-10-26 DIAGNOSIS — I10 Essential (primary) hypertension: Secondary | ICD-10-CM | POA: Diagnosis present

## 2021-10-26 LAB — COMPREHENSIVE METABOLIC PANEL
ALT: 25 IU/L (ref 0–32)
AST: 34 IU/L (ref 0–40)
Albumin/Globulin Ratio: 1.6 (ref 1.2–2.2)
Albumin: 4.6 g/dL (ref 3.8–4.9)
Alkaline Phosphatase: 79 IU/L (ref 44–121)
BUN/Creatinine Ratio: 25 — ABNORMAL HIGH (ref 9–23)
BUN: 20 mg/dL (ref 6–24)
Bilirubin Total: 0.6 mg/dL (ref 0.0–1.2)
CO2: 28 mmol/L (ref 20–29)
Calcium: 10.1 mg/dL (ref 8.7–10.2)
Chloride: 99 mmol/L (ref 96–106)
Creatinine, Ser: 0.79 mg/dL (ref 0.57–1.00)
Globulin, Total: 2.9 g/dL (ref 1.5–4.5)
Glucose: 105 mg/dL — ABNORMAL HIGH (ref 70–99)
Potassium: 5.1 mmol/L (ref 3.5–5.2)
Sodium: 138 mmol/L (ref 134–144)
Total Protein: 7.5 g/dL (ref 6.0–8.5)
eGFR: 86 mL/min/{1.73_m2} (ref >59)

## 2021-10-26 LAB — LIPID PANEL
Chol/HDL Ratio: 2 ratio (ref 0.0–4.4)
Cholesterol, Total: 166 mg/dL (ref 100–199)
HDL: 83 mg/dL (ref >39)
LDL Chol Calc (NIH): 70 mg/dL (ref 0–99)
Triglycerides: 69 mg/dL (ref 0–149)
VLDL Cholesterol Cal: 13 mg/dL (ref 5–40)

## 2021-10-26 LAB — ECHOCARDIOGRAM COMPLETE
Area-P 1/2: 2.96 cm2
S' Lateral: 2.6 cm

## 2021-10-31 ENCOUNTER — Telehealth: Payer: Self-pay | Admitting: Cardiology

## 2021-10-31 NOTE — Telephone Encounter (Signed)
Patient calling in for results  Please advise 

## 2021-10-31 NOTE — Telephone Encounter (Signed)
Contacted patient, gave ECHO results and lab results.   However, patient states that her BP readings have still been elevated. She states the other day it was 160's/100's and she wakes up every morning to it being elevated. She would like to know what else she can do, based on ECHO results she wants to keep her BP low.   We discussed her medications:  Carvedilol 6.25 mg BID.  Spironolactone 25 mg daily.  - she states she is on another medication (however nothing on list) reviewed last OV that states she was on Chlorthalidone 25 mg as well. This was removed on 08/11? I will send message over to NP to review and get recommendations.

## 2021-11-01 ENCOUNTER — Encounter: Payer: Self-pay | Admitting: Cardiology

## 2021-11-01 ENCOUNTER — Telehealth: Payer: Self-pay | Admitting: Cardiology

## 2021-11-01 NOTE — Telephone Encounter (Signed)
Returned call to patient no answer.Unable to leave a message voicemail full. °

## 2021-11-01 NOTE — Telephone Encounter (Signed)
Patient called to discuss her medication with the nurse. Please advise

## 2021-11-01 NOTE — Telephone Encounter (Signed)
error 

## 2021-11-03 NOTE — Telephone Encounter (Signed)
Attempted to contact patient, unable to reach.  Will try again later.

## 2021-11-04 ENCOUNTER — Other Ambulatory Visit: Payer: Self-pay

## 2021-11-04 DIAGNOSIS — I10 Essential (primary) hypertension: Secondary | ICD-10-CM

## 2021-11-04 NOTE — Telephone Encounter (Signed)
Called patient, advised of message below.  She is aware to take the extra is BP is elevated.   BMET ordered for 2 weeks.

## 2021-11-11 NOTE — Telephone Encounter (Signed)
Duplicate

## 2021-11-30 ENCOUNTER — Telehealth: Payer: Self-pay | Admitting: Cardiology

## 2021-11-30 NOTE — Telephone Encounter (Signed)
Called patient, she states she is unable to talk right now and will call back.

## 2021-11-30 NOTE — Telephone Encounter (Signed)
Pt c/o BP issue: STAT if pt c/o blurred vision, one-sided weakness or slurred speech  1. What are your last 5 BP readings? 170/98 - she said they are running high like this-o  2. Are you having any other symptoms (ex. Dizziness, headache, blurred vision, passed out)? 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 What is your BP issue? High blood pressure- d

## 2021-12-01 NOTE — Progress Notes (Signed)
Cardiology Office Note:    Date:  12/02/2021   ID:  Wendy Jensen, DOB 1960-12-10, MRN [id number]  PCP:  Georganna Skeans, MD   [facility] Providers Cardiologist:  Little Ishikawa, MD Cardiology APP:  Marcelino Duster, PA     Referring MD: Georganna Skeans, MD   Chief Complaint: elevated BP  History of Present Illness:    Wendy Jensen is a very pleasant 61 y.o. female with a hx of HTN, HLD, former tobacco abuse, peptic ulcer disease  Referred to cardiology by PCP for management of hypertension and initially seen by Dr. Bjorn Pippin on 04/05/20. Hypertension difficult to control with multiple drug intolerances including amlodipine, hydralazine, lisinopril, losartan, metoprolol, and HCTZ.  At last office visit on 10/21/2021 with Joni Reining, NP reported unable to tolerate amlodipine which was prescribed at ov on 07/15/21 due to palpitations and chest pressure.  She has greatly reduced sodium intake and is more active and at that time BP was better controlled on spironolactone 25 mg daily, carvedilol 6.25 mg twice daily and chlorthalidone 25 mg daily. Kidney function was stable on this regimen.  She was advised to continue current therapy and return in 6 months for follow-up.  She called our office 11/30/2021 with reports of elevated BP.  She was also having headache blurred vision but was unable to talk at the time the nurse called her back.  Today, she is here alone for follow-up.  Reports concerns for high SBP readings of 160-170 at home. School nurse checked BP on 9/21 and it was 138/78 mmHg. BP by home machine was 143/94, she felt lightheaded.  Reports she is very anxious because she has been living with high BP for many years and no one treated her. Cooks at home, does not like processed foods. Works 2 jobs, is on her feet from 5 am to 9 pm.  Reports work is not stressful. Currently takes carvedilol and carvedilol and chlorthalidone in the mornings, then at 11 am  takes spironolactone.  Sometimes forgets evening dose of carvedilol.  BP is elevated when she wakes up most mornings. Initially reported some chest discomfort to CMA who was rooming her, admitted to me that it was likely anxiety. She denies chest pain, shortness of breath, lower extremity edema, fatigue, palpitations, melena, hematuria, hemoptysis, diaphoresis, weakness, presyncope, syncope, orthopnea, and PND.   Past Medical History:  Diagnosis Date   GERD (gastroesophageal reflux disease)    Hypertension    Peptic ulcer    Reflux     Past Surgical History:  Procedure Laterality Date   COLONOSCOPY     ESOPHAGEAL DILATION     TUBAL LIGATION     UPPER GI ENDOSCOPY      Current Medications: Current Meds  Medication Sig   carvedilol (COREG) 6.25 MG tablet TAKE 1 TABLET(6.25 MG) BY MOUTH TWICE DAILY WITH A MEAL   chlorthalidone (HYGROTON) 25 MG tablet Take 25 mg by mouth daily.   ezetimibe (ZETIA) 10 MG tablet Take 1 tablet (10 mg total) by mouth daily.   rosuvastatin (CRESTOR) 20 MG tablet Take 1 tablet (20 mg total) by mouth daily.   spironolactone (ALDACTONE) 25 MG tablet Take 1 tablet (25 mg total) by mouth daily.     Allergies:   Amlodipine benzoate, Amlodipine besylate, Hydralazine, Hydrochlorothiazide, Lisinopril, Losartan, and Metoprolol tartrate   Social History   Socioeconomic History   Marital status: Single    Spouse name: Not on file   Number of children: Not on file  Years of education: Not on file   Highest education level: Not on file  Occupational History   Not on file  Tobacco Use   Smoking status: Former    Packs/day: 0.15    Types: Cigarettes    Quit date: 09/10/2018    Years since quitting: 3.2   Smokeless tobacco: Never  Vaping Use   Vaping Use: Never used  Substance and Sexual Activity   Alcohol use: Yes    Comment: 1 glass beer per day   Drug use: No   Sexual activity: Not Currently  Other Topics Concern   Not on file  Social History  Narrative   Not on file   Social Determinants of Health   Financial Resource Strain: Not on file  Food Insecurity: Not on file  Transportation Needs: Not on file  Physical Activity: Not on file  Stress: Not on file  Social Connections: Not on file     Family History: The patient's family history includes Hypertension in her father.  ROS:   Please see the history of present illness.   All other systems reviewed and are negative.  Labs/Other Studies Reviewed:    The following studies were reviewed today:  Echo 10/26/21  1. Left ventricular ejection fraction, by estimation, is 55%. The left  ventricle has normal function. The left ventricle has no regional wall  motion abnormalities. Mild focal basal septal left ventricular  hypertrophy. Left ventricular diastolic  parameters are consistent with Grade I diastolic dysfunction (impaired  relaxation).   2. Right ventricular systolic function is normal. The right ventricular  size is normal. There is normal pulmonary artery systolic pressure. The  estimated right ventricular systolic pressure is 20.6 mmHg.   3. The mitral valve is normal in structure. Trivial mitral valve  regurgitation. No evidence of mitral stenosis.   4. The aortic valve is tricuspid. Aortic valve regurgitation is not  visualized. No aortic stenosis is present.   5. The inferior vena cava is normal in size with greater than 50%  respiratory variability, suggesting right atrial pressure of 3 mmHg.   Recent Labs: 03/30/2021: Hemoglobin 14.3; Platelets 311 10/26/2021: ALT 25; BUN 20; Creatinine, Ser 0.79; Potassium 5.1; Sodium 138  Recent Lipid Panel    Component Value Date/Time   CHOL 166 10/26/2021 0917   TRIG 69 10/26/2021 0917   HDL 83 10/26/2021 0917   CHOLHDL 2.0 10/26/2021 0917   LDLCALC 70 10/26/2021 0917   LDLDIRECT 179 (H) 06/24/2019 1412     Risk Assessment/Calculations:      Physical Exam:    VS:  BP 122/68   Pulse 67   Ht 5\' 4"  (1.626  m)   Wt 151 lb (68.5 kg)   BMI 25.92 kg/m     Wt Readings from Last 3 Encounters:  12/02/21 151 lb (68.5 kg)  10/21/21 152 lb 12.8 oz (69.3 kg)  07/15/21 147 lb 3.2 oz (66.8 kg)     GEN:  Well nourished, well developed in no acute distress HEENT: Normal NECK: No JVD; No carotid bruits CARDIAC: RRR, no murmurs, rubs, gallops RESPIRATORY:  Clear to auscultation without rales, wheezing or rhonchi  ABDOMEN: Soft, non-tender, non-distended MUSCULOSKELETAL:  No edema; No deformity. 2+ pedal pulses, equal bilaterally SKIN: Warm and dry NEUROLOGIC:  Alert and oriented x 3 PSYCHIATRIC:  Normal affect   EKG:  EKG is ordered today.  The ekg ordered today demonstrates sinus rhythm at 67 bpm with 1st degree AV block, nonspecific T wave abnormality  Diagnoses:    1. Essential hypertension   2. Mixed hyperlipidemia   3. First degree AV block    Assessment and Plan:     Hypertension: BP is well-controlled here today, even better when I rechecked later in the visit. Reassurance provided. Encouraged her to check BP at least 2 hours after taking morning medications. Will check BMET today. Continue current medications.   Hyperlipidemia: LDL 70 on 10/26/21. Continue Crestor.   First degree AV block: PR interval 212 ms. No acute change from previous EKG. No lightheadedness, presyncope, syncope. Continue to monitor on routine EKG.     Disposition: 5 months with Dr. Gardiner Rhyme  Medication Adjustments/Labs and Tests Ordered: Current medicines are reviewed at length with the patient today.  Concerns regarding medicines are outlined above.  Orders Placed This Encounter  Procedures   Basic Metabolic Panel (BMET)   EKG 12-Lead   No orders of the defined types were placed in this encounter.   Patient Instructions  Medication Instructions:   Your physician recommends that you continue on your current medications as directed. Please refer to the Current Medication list given to you today.   *If  you need a refill on your cardiac medications before your next appointment, please call your pharmacy*   Lab Work:  TODAY!!!! BMET  If you have labs (blood work) drawn today and your tests are completely normal, you will receive your results only by: [place] (if you have MyChart) OR A paper copy in the mail If you have any lab test that is abnormal or we need to change your treatment, we will call you to review the results.   Testing/Procedures:  None ordered.   Follow-Up: At [facility], you and your health needs are our priority.  As part of our continuing mission to provide you with exceptional heart care, we have created designated Provider Care Teams.  These Care Teams include your primary Cardiologist (physician) and Advanced Practice Providers (APPs -  Physician Assistants and Nurse Practitioners) who all work together to provide you with the care you need, when you need it.  We recommend signing up for the patient portal called "MyChart".  Sign up information is provided on this After Visit Summary.  MyChart is used to connect with patients for Virtual Visits (Telemedicine).  Patients are able to view lab/test results, encounter notes, upcoming appointments, etc.  Non-urgent messages can be sent to your provider as well.   To learn more about what you can do with MyChart, go to [email or web address].    Your next appointment:   5 month(s)  The format for your next appointment:   In Person  Provider:   Donato Heinz, MD     Important Information About Sugar         Signed, Emmaline Life, NP  12/02/2021 12:11 PM    [place]

## 2021-12-02 ENCOUNTER — Ambulatory Visit: Payer: BC Managed Care – PPO | Attending: Nurse Practitioner | Admitting: Nurse Practitioner

## 2021-12-02 ENCOUNTER — Encounter: Payer: Self-pay | Admitting: Nurse Practitioner

## 2021-12-02 VITALS — BP 122/68 | HR 67 | Ht 64.0 in | Wt 151.0 lb

## 2021-12-02 DIAGNOSIS — I10 Essential (primary) hypertension: Secondary | ICD-10-CM | POA: Diagnosis not present

## 2021-12-02 DIAGNOSIS — E782 Mixed hyperlipidemia: Secondary | ICD-10-CM | POA: Diagnosis not present

## 2021-12-02 DIAGNOSIS — I440 Atrioventricular block, first degree: Secondary | ICD-10-CM

## 2021-12-02 LAB — BASIC METABOLIC PANEL
BUN/Creatinine Ratio: 31 — ABNORMAL HIGH (ref 9–23)
BUN: 21 mg/dL (ref 6–24)
CO2: 27 mmol/L (ref 20–29)
Calcium: 9.4 mg/dL (ref 8.7–10.2)
Chloride: 96 mmol/L (ref 96–106)
Creatinine, Ser: 0.67 mg/dL (ref 0.57–1.00)
Glucose: 134 mg/dL — ABNORMAL HIGH (ref 70–99)
Potassium: 3.7 mmol/L (ref 3.5–5.2)
Sodium: 134 mmol/L (ref 134–144)
eGFR: 101 mL/min/{1.73_m2} (ref >59)

## 2021-12-02 NOTE — Patient Instructions (Signed)
Medication Instructions:   Your physician recommends that you continue on your current medications as directed. Please refer to the Current Medication list given to you today.   *If you need a refill on your cardiac medications before your next appointment, please call your pharmacy*   Lab Work:  TODAY!!!! BMET  If you have labs (blood work) drawn today and your tests are completely normal, you will receive your results only by: [place] (if you have MyChart) OR A paper copy in the mail If you have any lab test that is abnormal or we need to change your treatment, we will call you to review the results.   Testing/Procedures:  None ordered.   Follow-Up: At [facility], you and your health needs are our priority.  As part of our continuing mission to provide you with exceptional heart care, we have created designated Provider Care Teams.  These Care Teams include your primary Cardiologist (physician) and Advanced Practice Providers (APPs -  Physician Assistants and Nurse Practitioners) who all work together to provide you with the care you need, when you need it.  We recommend signing up for the patient portal called "MyChart".  Sign up information is provided on this After Visit Summary.  MyChart is used to connect with patients for Virtual Visits (Telemedicine).  Patients are able to view lab/test results, encounter notes, upcoming appointments, etc.  Non-urgent messages can be sent to your provider as well.   To learn more about what you can do with MyChart, go to [email or web address].    Your next appointment:   5 month(s)  The format for your next appointment:   In Person  Provider:   Donato Heinz, MD     Important Information About Sugar

## 2021-12-03 IMAGING — RF DG ESOPHAGUS
14 of 15 series · 14 of 24 positions shown · non-contrast
Comparison: Prior study from 1660.

CLINICAL DATA: Esophageal dysphagia.

EXAM:
ESOPHOGRAM/BARIUM SWALLOW
TECHNIQUE: Combined double contrast and single contrast examination performed
using effervescent crystals, thick barium liquid, and thin barium
liquid.
FLUOROSCOPY TIME:  Fluoroscopy Time:  4 minutes and 54 seconds
Radiation Exposure Index (if provided by the fluoroscopic device):
28 mGy
Number of Acquired Spot Images: 0

[Series 1: sequence · 1 of 8 frames shown (1 of 13)]
[frame 2/8]
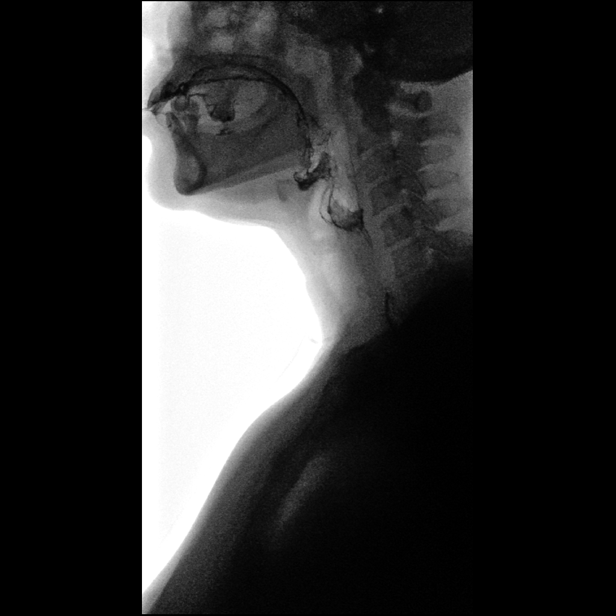

[Series 2: sequence · 1 of 23 frames shown (2 of 13)]
[frame 2/23]
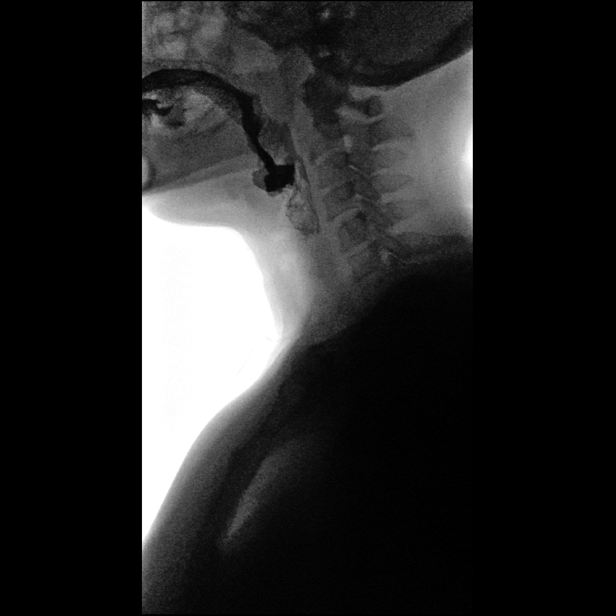

[Series 3: sequence · 1 of 46 frames shown (3 of 13)]
[frame 7/46]
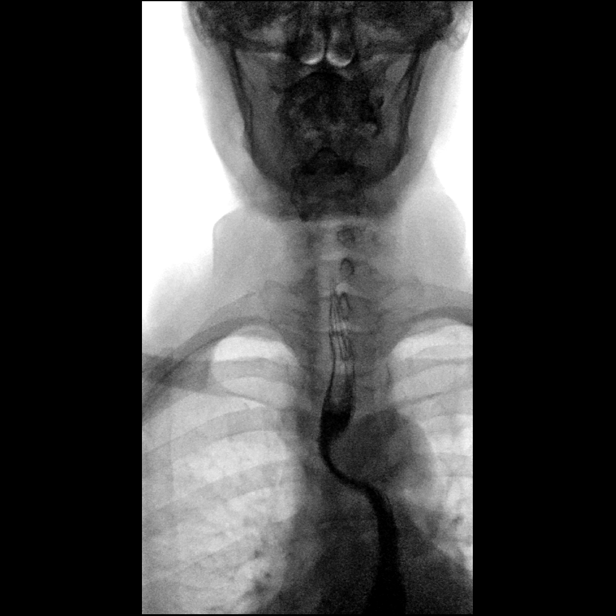

[Series 4: sequence · 0.31mm/px · 1 of 2 frames shown (4 of 13)]
[frame 1/2]
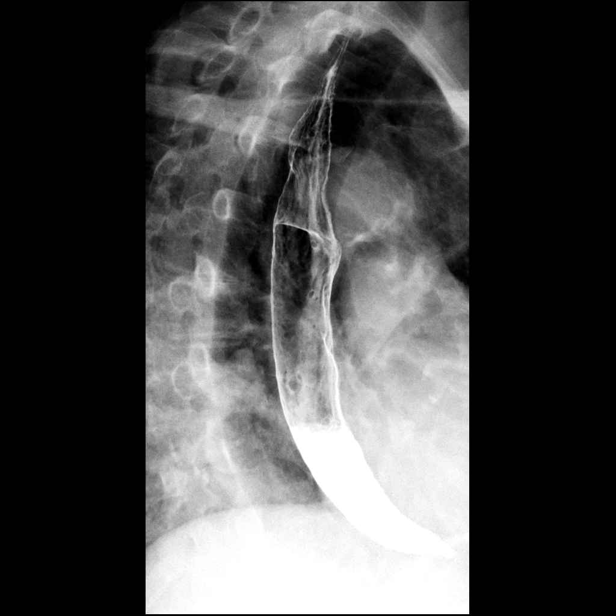

[Series 5: sequence · 0.31mm/px · 1 of 5 frames shown (5 of 13)]
[frame 1/5]
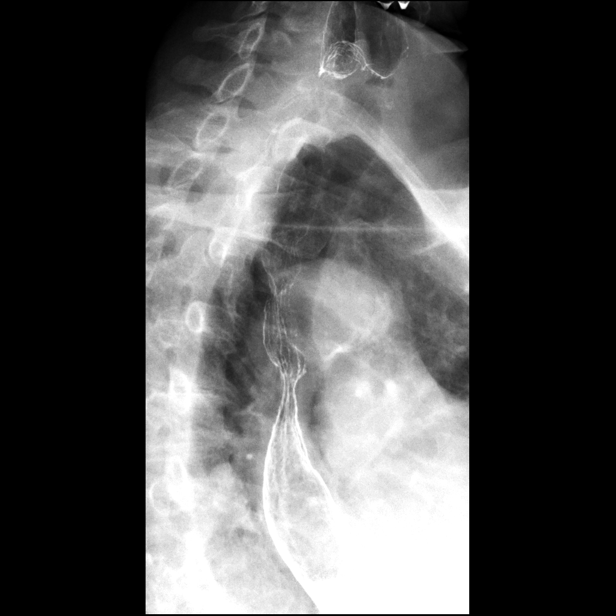

[Series 6: sequence · 0.31mm/px · 1 of 6 frames shown (6 of 13)]
[frame 4/6]
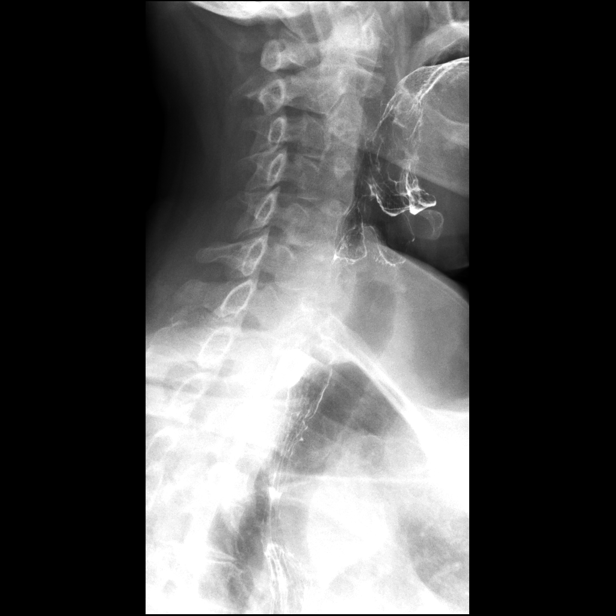

[Series 7: sequence · 0.31mm/px · 1 of 4 frames shown (7 of 13)]
[frame 4/4]
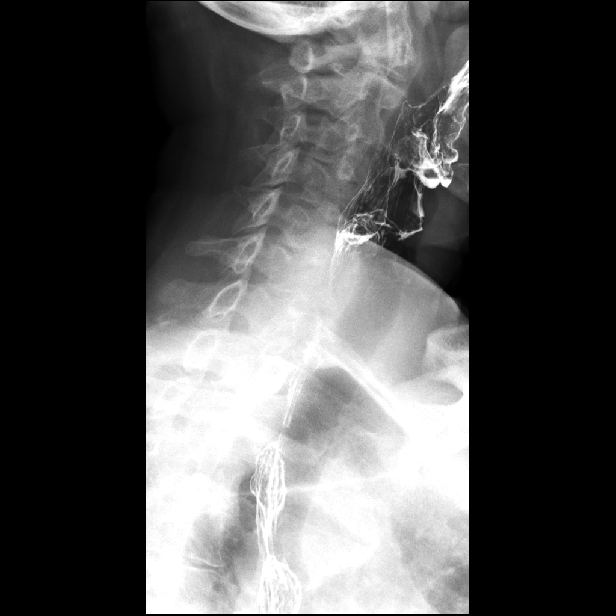

[Series 8: sequence · 0.31mm/px · 1 of 4 frames shown (8 of 13)]
[frame 3/4]
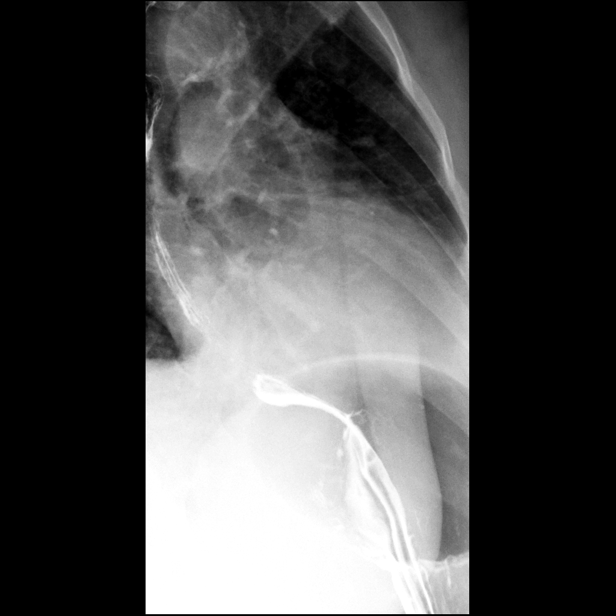

[Series 10: sequence · 0.31mm/px · 1 of 5 frames shown (9 of 13)]
[frame 1/5]
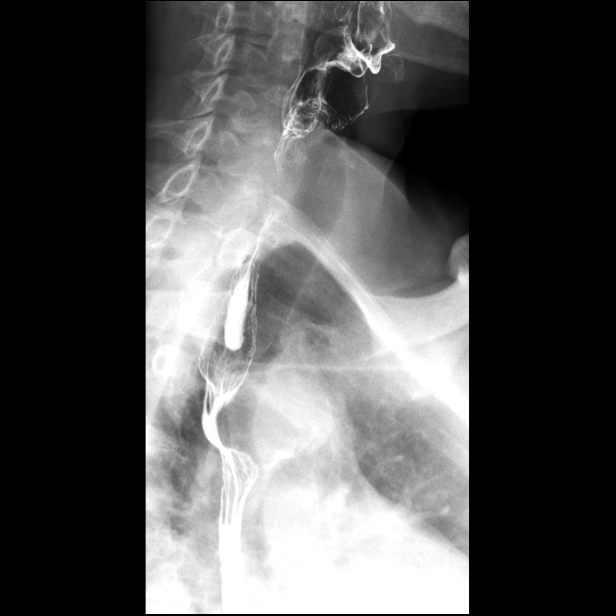

[Series 11: sequence · 0.31mm/px · 1 of 3 frames shown (10 of 13)]
[frame 2/3]
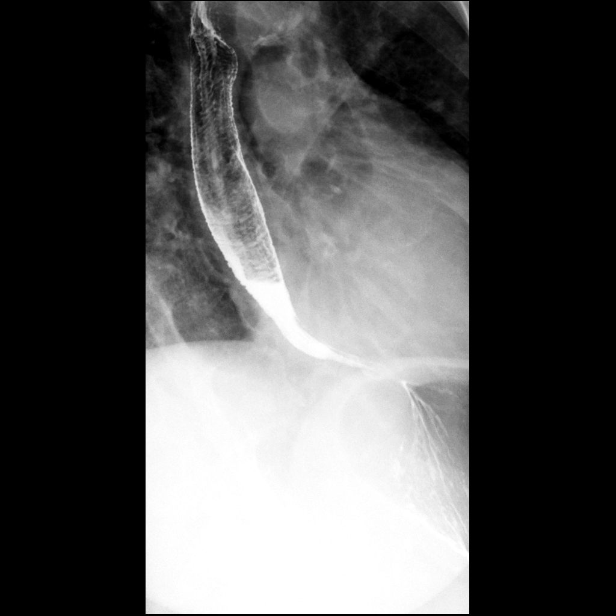

[Series 12: sequence · 1 of 53 frames shown (11 of 13)]
[frame 27/53]
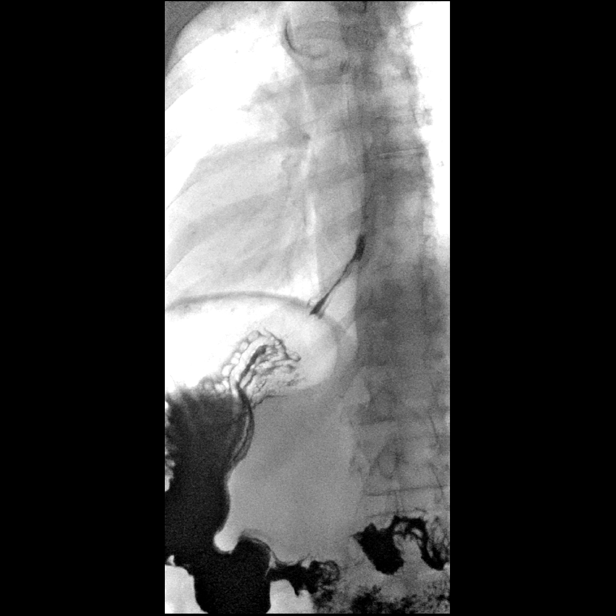

[Series 13: sequence · 1 of 56 frames shown (12 of 13)]
[frame 9/56]
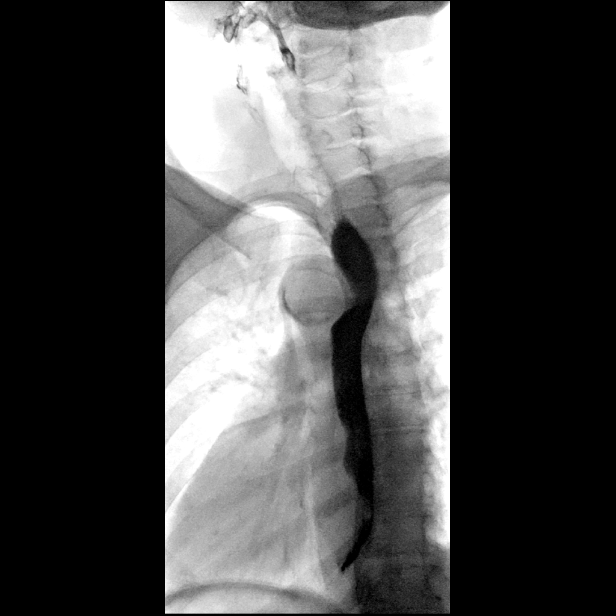

[Series 14: sequence · 1 of 150 frames shown (13 of 13)]
[frame 6/150]
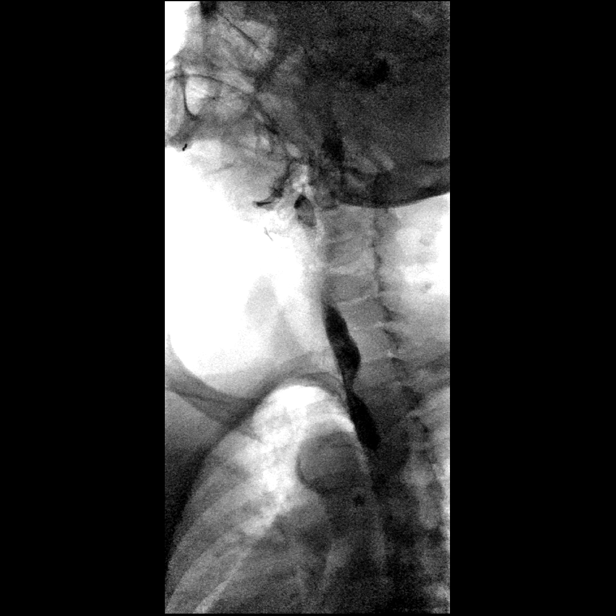

[Series 15: one shot · 1 of 1 slices shown]
[im 1/1]
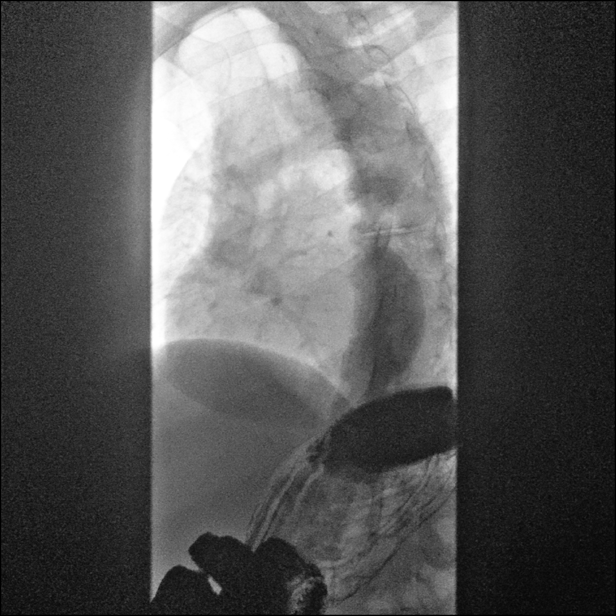

[14 of 24 positions shown; findings below may reference images not displayed]

FINDINGS: Initial barium swallows demonstrate normal pharyngeal motion with
swallowing. No laryngeal penetration or aspiration. No upper
esophageal webs, strictures or diverticuli.

Normal esophageal motility. Mild extrinsic compression of the upper
thoracic esophagus due to tortuosity and ectasia of the thoracic
aorta. No esophageal mass is identified. Persistent smooth narrowing
involving the distal esophagus could reflect a mild reflux
stricture. However, patient would not attempt to swallow the barium
pill.

No hiatal hernia could be demonstrated although there was a small 1
on the prior study from 1660. No GE reflux was demonstrated.
IMPRESSION: 1. Normal esophageal motility.
2. Smooth narrowing of the distal esophagus could reflect a mild
reflux stricture. Patient would not swallow a barium pill for more
definitive evaluation. Endoscopic evaluation may be indicated.
3. No hiatal hernia or GE reflux was demonstrated.

## 2021-12-06 ENCOUNTER — Other Ambulatory Visit: Payer: Self-pay | Admitting: Family Medicine

## 2021-12-06 DIAGNOSIS — Z1231 Encounter for screening mammogram for malignant neoplasm of breast: Secondary | ICD-10-CM

## 2022-01-06 ENCOUNTER — Ambulatory Visit: Payer: BC Managed Care – PPO

## 2022-01-16 ENCOUNTER — Telehealth: Payer: Self-pay

## 2022-01-16 ENCOUNTER — Telehealth: Payer: Self-pay | Admitting: Cardiology

## 2022-01-16 NOTE — Telephone Encounter (Signed)
Patient stated she has not been taking spironolactone for 1 week because it "Hasn't help my blood pressure and it makes me feel bad." BP ranges from 138/83 to 170/95. Upon reviewing medications, she has not picked up her refills for zetia or rosuvastatin. She stated she didn't know she was supposed to be on them. When I asked her if she had any chest pain or headache, she disconnected the call.  Advise on BP.

## 2022-01-16 NOTE — Telephone Encounter (Signed)
LMTCB

## 2022-01-16 NOTE — Telephone Encounter (Signed)
Patient returned RN's call.  Patient stated he starts work at 3:30 pm.

## 2022-01-16 NOTE — Telephone Encounter (Signed)
Pt c/o BP issue: STAT if pt c/o blurred vision, one-sided weakness or slurred speech  1. What are your last 5 BP readings?  169/75 75  2. Are you having any other symptoms (ex. Dizziness, headache, blurred vision, passed out)?  Lightheadedness  3. What is your BP issue?   BP is running in the 170's. Patient states she has also been taking Spironolactone 25 MG once daily with lunch, but it is ineffective.

## 2022-01-17 ENCOUNTER — Ambulatory Visit: Payer: Self-pay

## 2022-01-17 NOTE — Telephone Encounter (Signed)
Recommend scheduling follow-up appointment, can schedule with me or APP

## 2022-01-17 NOTE — Telephone Encounter (Signed)
  Chief Complaint: breast pain Symptoms: L breast tenderness Frequency: 1 month  Pertinent Negatives: Patient denies swelling or redness or discharge  Disposition: [] ED /[] Urgent Care (no appt availability in office) / [] Appointment(In office/virtual)/ []  [place] [facility]/ [] Home Care/ [] Refused Recommended Disposition /[] [place] [facility]/ [x]  Follow-up with PCP Additional Notes: pt called to schedule mammogram but was told since she has tenderness had to call PCP, pt unable to schedule appt because of her work schedule. Pt is available Friday 01/20/22 but no appts available. Pt also willing to just go have mammogram down without coming to PCP. I advised pt I would send message back to see what can be recommended.   Summary: Experiencing tenderness on the left breast.   Pt stated she was trying to schedule an appointment with Imaging for a breast exam and was advised to call her PCP to request an order be sent. I advised the patient that she needs to be seen in the office with the PCP for an order to be sent, as this has not been previously discussed and is a new symptom. offered pt multiple available appointments, which she declined and stated she had to work.  Experiencing tenderness on the left breast.     Pt seeking clinical advice.         Reason for Disposition  [1] Breast pain AND [2] cause is not known  Answer Assessment - Initial Assessment Questions 1. SYMPTOM: "What's the main symptom you're concerned about?"  (e.g., lump, pain, rash, nipple discharge)     Tenderness  2. LOCATION: "Where is the sx located?"     L breast  3. ONSET: "When did sx  start?"     1 month  4. PRIOR HISTORY: "Do you have any history of prior problems with your breasts?" (e.g., lumps, cancer, fibrocystic breast disease)     no 6. OTHER SYMPTOMS: "Do you have any other symptoms?" (e.g., fever, breast pain, redness or rash, nipple discharge)     no  Protocols used: Breast  Symptoms-A-AH

## 2022-01-18 ENCOUNTER — Telehealth: Payer: Self-pay | Admitting: Cardiology

## 2022-01-18 NOTE — Telephone Encounter (Signed)
Summary: Experiencing tenderness on the left breast.   The patient called back in stating the urgency to get this order for breast imaging. Please assist patient further as soon as possible

## 2022-01-18 NOTE — Telephone Encounter (Signed)
Pt c/o BP issue: STAT if pt c/o blurred vision, one-sided weakness or slurred speech  1. What are your last 5 BP readings?   170/95  2. Are you having any other symptoms (ex. Dizziness, headache, blurred vision, passed out)?   Lightheadedness  3. What is your BP issue?   Patient stated his BP has been running high.

## 2022-01-18 NOTE — Telephone Encounter (Signed)
Received call from patient- stating that her blood pressure has continued to run high. I did see message from MD to get her in for an app appointment- scheduled for Friday 11/10 with Joni Reining, NP  Patient states she needs something now to make her blood pressure low- she did check with me on the phone and it was 158/93 HR 74.  I will send to MD to review  Thanks!

## 2022-01-19 NOTE — Telephone Encounter (Signed)
Left message to make patient aware to bring BP log and BP cuff to appt today

## 2022-01-19 NOTE — Telephone Encounter (Signed)
See duplicate phone note 

## 2022-01-19 NOTE — Telephone Encounter (Signed)
Has appointment tomorrow, recommend bringing home BP log and home BP monitor to calibrate to appointment

## 2022-01-19 NOTE — Progress Notes (Signed)
Cardiology Clinic Note   Patient Name: Wendy Jensen Date of Encounter: 01/20/2022  Primary Care Provider:  Georganna Skeans, MD Primary Cardiologist:  Little Ishikawa, MD  Patient Profile    Wendy Jensen is a 61 y.o. female with a hx of hypertension, hyperlipidemia, peptic ulcer disease, and former smoker.  She has multiple drug allergies: Amlodipine, hydralazine, lisinopril, losartan, metoprolol, and HCTZ.  As such, blood pressure control has been challenging.  She is able to tolerate carvedilol and chlorthalidone.  Addition of spironolactone.  She was started on amlodipine but was unable to tolerate this and therefore this was listed as an allergy.    Repeat echocardiogram was planned to reevaluate her LV systolic function with known history of hypertension.  Repeat echo on 10/26/2021 revealed normal LVEF of 55%, there was mild focal basal septal left ventricular hypertrophy, and grade 1 diastolic dysfunction.  LV systolic pressure was normal.  She called our office on 01/19/2022 due to elevated blood pressure which she reported is 158/93.  Appointment was made for reevaluation in the office today.  Past Medical History    Past Medical History:  Diagnosis Date   GERD (gastroesophageal reflux disease)    Hypertension    Peptic ulcer    Reflux    Past Surgical History:  Procedure Laterality Date   COLONOSCOPY     ESOPHAGEAL DILATION     TUBAL LIGATION     UPPER GI ENDOSCOPY      Allergies  Allergies  Allergen Reactions   Amlodipine Benzoate Other (See Comments)    Patient reports this made her feel sick.   Amlodipine Besylate     Patient reports this makes her feel sick.   Hydralazine Other (See Comments)   Hydrochlorothiazide Other (See Comments)   Lisinopril Other (See Comments)    Patient reports this made her feel sick.   Losartan Other (See Comments)   Metoprolol Tartrate     Patient reports this makes her feel sick.   Spironolactone      Nausea     History of Present Illness    Wendy Jensen comes today with concerns about blood pressure elevation in the afternoon.  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  She keeps track of her blood pressure and has noticed that her blood pressure is really elevated after she gets off of work.  She has been taken spironolactone but it is made her very sick to her stomach, and often finds in the afternoon that her blood pressure is not affected by taking it.  She stopped taking it on her own and begin to feel some better but is still noticed that her blood pressure is elevated in the afternoon after leaving her first child.  She admits to being under a lot of stress in her first job with coworkers but has noticed that the pressure has been elevated in the afternoon prior to the stressful environment.  She has noticed that her blood pressure is risen to over 160 systolic to over 94-17'E diastolic in the afternoons.  She has been compliant with all of her other medications.   Home Medications    Current Outpatient Medications  Medication Sig Dispense Refill   carvedilol (COREG) 6.25 MG tablet TAKE 1 TABLET(6.25 MG) BY MOUTH TWICE DAILY WITH A MEAL 180 tablet 3   chlorthalidone (HYGROTEN) 15 MG tablet Take  1 tablet (15 mg total) by mouth daily. At 2 PM 90 tablet 2   chlorthalidone (HYGROTON) 25 MG tablet Take 25 mg by mouth daily.     ezetimibe (ZETIA) 10 MG tablet Take 1 tablet (10 mg total) by mouth daily. 90 tablet 3   rosuvastatin (CRESTOR) 20 MG tablet Take 1 tablet (20 mg total) by mouth daily. 90 tablet 3   No current facility-administered medications for this visit.     Family History    Family History  Problem Relation Age of Onset   Hypertension Father    She indicated that her mother is deceased. She indicated that her father is alive. She indicated that her maternal grandfather is  alive.  Social History    Social History   Socioeconomic History   Marital status: Single    Spouse name: Not on file   Number of children: Not on file   Years of education: Not on file   Highest education level: Not on file  Occupational History   Not on file  Tobacco Use   Smoking status: Former    Packs/day: 0.15    Types: Cigarettes    Quit date: 09/10/2018    Years since quitting: 3.3   Smokeless tobacco: Never  Vaping Use   Vaping Use: Never used  Substance and Sexual Activity   Alcohol use: Yes    Comment: 1 glass beer per day   Drug use: No   Sexual activity: Not Currently  Other Topics Concern   Not on file  Social History Narrative   Not on file   Social Determinants of Health   Financial Resource Strain: Not on file  Food Insecurity: Not on file  Transportation Needs: Not on file  Physical Activity: Not on file  Stress: Not on file  Social Connections: Not on file  Intimate Partner Violence: Not on file     Review of Systems    General:  No chills, fever, night sweats or weight changes.  Cardiovascular:  No chest pain, dyspnea on exertion, edema, orthopnea, palpitations, paroxysmal nocturnal dyspnea. Dermatological: No rash, lesions/masses Respiratory: No cough, dyspnea Urologic: No hematuria, dysuria Abdominal:   No nausea, vomiting, diarrhea, bright red blood per rectum, melena, or hematemesis Neurologic:  No visual changes, wkns, changes in mental status. All other systems reviewed and are otherwise negative except as noted above.     Physical Exam    VS:  BP 134/82 (BP Location: Left Arm, Patient Position: Sitting, Cuff Size: Large)   Pulse 77   Ht 5\' 4"  (1.626 m)   Wt 154 lb 3.2 oz (69.9 kg)   SpO2 95%   BMI 26.47 kg/m  , BMI Body mass index is 26.47 kg/m.     GEN: Well nourished, well developed, in no acute distress. HEENT: normal.  Edentulous Neck: Supple, no JVD, carotid bruits, or masses. Cardiac: RRR, no murmurs, rubs, or  gallops. No clubbing, cyanosis, edema.  Radials/DP/PT 2+ and equal bilaterally.  Respiratory:  Respirations regular and unlabored, clear to auscultation bilaterally. GI: Soft, nontender, nondistended, BS + x 4. MS: no deformity or atrophy. Skin: warm and dry, no rash. Neuro:  Strength and sensation are intact. Psych: Normal affect.  Accessory Clinical Findings    Lab Results  Component Value Date   WBC 6.9 03/30/2021   HGB 14.3 03/30/2021   HCT 41.4 03/30/2021   MCV 90 03/30/2021   PLT 311 03/30/2021   Lab Results  Component Value Date   CREATININE  0.67 12/02/2021   BUN 21 12/02/2021   NA 134 12/02/2021   K 3.7 12/02/2021   CL 96 12/02/2021   CO2 27 12/02/2021   Lab Results  Component Value Date   ALT 25 10/26/2021   AST 34 10/26/2021   ALKPHOS 79 10/26/2021   BILITOT 0.6 10/26/2021   Lab Results  Component Value Date   CHOL 166 10/26/2021   HDL 83 10/26/2021   LDLCALC 70 10/26/2021   LDLDIRECT 179 (H) 06/24/2019   TRIG 69 10/26/2021   CHOLHDL 2.0 10/26/2021    Lab Results  Component Value Date   HGBA1C 5.9 (H) 04/05/2020    Review of Prior Studies: Echocardiogram 10/26/2021  1. Left ventricular ejection fraction, by estimation, is 55%. The left  ventricle has normal function. The left ventricle has no regional wall  motion abnormalities. Mild focal basal septal left ventricular  hypertrophy. Left ventricular diastolic  parameters are consistent with Grade I diastolic dysfunction (impaired  relaxation).   2. Right ventricular systolic function is normal. The right ventricular  size is normal. There is normal pulmonary artery systolic pressure. The  estimated right ventricular systolic pressure is 20.6 mmHg.   3. The mitral valve is normal in structure. Trivial mitral valve  regurgitation. No evidence of mitral stenosis.   4. The aortic valve is tricuspid. Aortic valve regurgitation is not  visualized. No aortic stenosis is present.   5. The inferior vena  cava is normal in size with greater than 50%  respiratory variability, suggesting right atrial pressure of 3 mmHg.    Assessment & Plan   1.  Hypertension: She has had difficulty controlling her blood pressure in the afternoon after leaving her first job.  She admits to it being slightly stressful but it is has been elevated in the afternoon prior to the stressful situation at her current workplace.  She has noticed her blood pressure 1 60-1 70 over 80s to 90s in the afternoon.  She temporarily stopped taking spironolactone as it was making her feel sick to her stomach, noticed that her blood pressures are still elevated and she started taking it again.  This caused her significant amount of nausea.  I will increase her chlorthalidone to 25 mg in the morning and 15 mg in the afternoon between her jobs around 2 PM.  She is advised on a low-sodium diet.  She is to keep track of her blood pressures over the next 6 weeks.  We will stop spironolactone as she is unable to tolerate this causing her to feel very badly and sick.  If her blood pressure remains elevated neck step would be to increase her carvedilol dose to 12.5 mg twice daily.  She is going to follow-up with [place] in 6 weeks for reevaluation of her blood pressure control and she is to keep track of it at home.  She will need to BMET on follow up.  2.  Hypercholesterolemia: She is on rosuvastatin and estimated.  We will need to refer evaluate her status on next office visit.  Current medicines are reviewed at length with the patient today.  I have spent 25 min's  dedicated to the care of this patient on the date of this encounter to include pre-visit review of records, assessment, management and diagnostic testing,with shared decision making. Signed, Bettey Mare. Liborio Nixon, ANP, AACC   01/20/2022 3:05 PM      Office [phone] Fax [phone]  Notice: This dictation was prepared with Dragon dictation along  with smaller phrase  technology. Any transcriptional errors that result from this process are unintentional and may not be corrected upon review.

## 2022-01-20 ENCOUNTER — Ambulatory Visit: Payer: BC Managed Care – PPO | Attending: Adult Health | Admitting: Adult Health

## 2022-01-20 ENCOUNTER — Encounter: Payer: Self-pay | Admitting: Adult Health

## 2022-01-20 ENCOUNTER — Ambulatory Visit (INDEPENDENT_AMBULATORY_CARE_PROVIDER_SITE_OTHER): Payer: BC Managed Care – PPO | Admitting: Family Medicine

## 2022-01-20 VITALS — BP 138/86 | HR 77 | Temp 98.1°F | Resp 16 | Wt 153.4 lb

## 2022-01-20 VITALS — BP 134/82 | HR 77 | Ht 64.0 in | Wt 154.2 lb

## 2022-01-20 DIAGNOSIS — E7800 Pure hypercholesterolemia, unspecified: Secondary | ICD-10-CM | POA: Diagnosis not present

## 2022-01-20 DIAGNOSIS — M25512 Pain in left shoulder: Secondary | ICD-10-CM | POA: Diagnosis not present

## 2022-01-20 DIAGNOSIS — I10 Essential (primary) hypertension: Secondary | ICD-10-CM | POA: Diagnosis not present

## 2022-01-20 DIAGNOSIS — N644 Mastodynia: Secondary | ICD-10-CM

## 2022-01-20 MED ORDER — CHLORTHALIDONE 15 MG PO TABS: 15.0000 mg | ORAL_TABLET | Freq: Every day | ORAL | 2 refills | Status: DC

## 2022-01-20 NOTE — Progress Notes (Unsigned)
Patient is having left breast pain. Patient has been having pain x 1 month, patient denies breast discharge  No other concerns today

## 2022-01-20 NOTE — Patient Instructions (Signed)
Medication Instructions:  Stop Spironolactone. Start Chlorthalidone 15 mg ( Take 1 Tablet Daily at 2 PM). *If you need a refill on your cardiac medications before your next appointment, please call your pharmacy*   Lab Work: No Labs If you have labs (blood work) drawn today and your tests are completely normal, you will receive your results only by: MyChart Message (if you have MyChart) OR A paper copy in the mail If you have any lab test that is abnormal or we need to change your treatment, we will call you to review the results.   Testing/Procedures: No Testing   Follow-Up: At [facility], you and your health needs are our priority.  As part of our continuing mission to provide you with exceptional heart care, we have created designated Provider Care Teams.  These Care Teams include your primary Cardiologist (physician) and Advanced Practice Providers (APPs -  Physician Assistants and Nurse Practitioners) who all work together to provide you with the care you need, when you need it.  We recommend signing up for the patient portal called "MyChart".  Sign up information is provided on this After Visit Summary.  MyChart is used to connect with patients for Virtual Visits (Telemedicine).  Patients are able to view lab/test results, encounter notes, upcoming appointments, etc.  Non-urgent messages can be sent to your provider as well.   To learn more about what you can do with MyChart, go to [email or web address].    Your next appointment:   6 week(s)  The format for your next appointment:   In Person  Provider:   Joni Reining, DNP, ANP

## 2022-01-23 ENCOUNTER — Encounter: Payer: Self-pay | Admitting: Family Medicine

## 2022-01-23 NOTE — Progress Notes (Signed)
Established Patient Office Visit  Subjective    Patient ID: Wendy Jensen, female    DOB: 01-22-1961  Age: 61 y.o. MRN: [id number]  CC: No chief complaint on file.   HPI Wendy Jensen presents for follow up of hypertension. Patient also reports some left breast pain. Patient reports left shoulder pain as well. She denies known trauma or injury.    Outpatient Encounter Medications as of 01/20/2022  Medication Sig   carvedilol (COREG) 6.25 MG tablet TAKE 1 TABLET(6.25 MG) BY MOUTH TWICE DAILY WITH A MEAL   chlorthalidone (HYGROTON) 25 MG tablet Take 25 mg by mouth daily.   ezetimibe (ZETIA) 10 MG tablet Take 1 tablet (10 mg total) by mouth daily.   rosuvastatin (CRESTOR) 20 MG tablet Take 1 tablet (20 mg total) by mouth daily.   [DISCONTINUED] spironolactone (ALDACTONE) 25 MG tablet Take 1 tablet (25 mg total) by mouth daily.   No facility-administered encounter medications on file as of 01/20/2022.    Past Medical History:  Diagnosis Date   GERD (gastroesophageal reflux disease)    Hypertension    Peptic ulcer    Reflux     Past Surgical History:  Procedure Laterality Date   COLONOSCOPY     ESOPHAGEAL DILATION     TUBAL LIGATION     UPPER GI ENDOSCOPY      Family History  Problem Relation Age of Onset   Hypertension Father     Social History   Socioeconomic History   Marital status: Single    Spouse name: Not on file   Number of children: Not on file   Years of education: Not on file   Highest education level: Not on file  Occupational History   Not on file  Tobacco Use   Smoking status: Former    Packs/day: 0.15    Types: Cigarettes    Quit date: 09/10/2018    Years since quitting: 3.3   Smokeless tobacco: Never  Vaping Use   Vaping Use: Never used  Substance and Sexual Activity   Alcohol use: Yes    Comment: 1 glass beer per day   Drug use: No   Sexual activity: Not Currently  Other Topics Concern   Not on file  Social History  Narrative   Not on file   Social Determinants of Health   Financial Resource Strain: Not on file  Food Insecurity: Not on file  Transportation Needs: Not on file  Physical Activity: Not on file  Stress: Not on file  Social Connections: Not on file  Intimate Partner Violence: Not on file    Review of Systems  All other systems reviewed and are negative.       Objective    BP 138/86   Pulse 77   Temp 98.1 F (36.7 C) (Oral)   Resp 16   Wt 153 lb 6.4 oz (69.6 kg)   BMI 26.33 kg/m   Physical Exam Vitals and nursing note reviewed.  Constitutional:      General: She is not in acute distress. Cardiovascular:     Rate and Rhythm: Normal rate and regular rhythm.  Pulmonary:     Effort: Pulmonary effort is normal.     Breath sounds: Normal breath sounds.  Chest:  Breasts:    Left: Tenderness present. No skin change.    Abdominal:     Palpations: Abdomen is soft.     Tenderness: There is no abdominal tenderness.  Musculoskeletal:     Left shoulder: Tenderness  present. No deformity. Decreased range of motion.  Neurological:     General: No focal deficit present.     Mental Status: She is alert and oriented to person, place, and time.         Assessment & Plan:   1. Breast pain, left Referral for ultrasound/mammogram. Tylenol/nsaids prn - [facility] Digital Diagnostic Bilat; Future - US BREAST ASPIRATION LEFT; Future  2. Essential hypertension Appears stable. continue  3. Left shoulder pain, unspecified chronicity Referral to ortho for further eval/mgt - Ambulatory referral to Orthopedic Surgery    No follow-ups on file.   Tommie Raymond, MD

## 2022-01-25 ENCOUNTER — Telehealth: Payer: Self-pay | Admitting: Cardiology

## 2022-01-25 NOTE — Telephone Encounter (Signed)
Patient stated she took chlorthalidone last night at work and it made her heart race and she was light-headed. BP today 143/95. She stated she is very agitated because she didn't understand how to take medication. Explained that she is to take chlorthalidone 25 mg in the morning and 15 mg at 2pm. She checked bp while on phone 159/103. Should we increase her carvedilol form 6.25 twice daily to 12.5mg  twice daily as suggested in note from 11/10? She is leaving for work soon and wants to know the dose to take.

## 2022-01-25 NOTE — Telephone Encounter (Signed)
Pt c/o BP issue: STAT if pt c/o blurred vision, one-sided weakness or slurred speech  1. What are your last 5 BP readings?  160/93 143/93 70 170/95 71 136/86  2. Are you having any other symptoms (ex. Dizziness, headache, blurred vision, passed out)? States she felt a little lightheaded.   3. What is your BP issue? Elevated BP.  She also stated she wants to talk to someone about her medication chlorthalidone and carvedilol.

## 2022-01-26 MED ORDER — CARVEDILOL 12.5 MG PO TABS: 12.5000 mg | ORAL_TABLET | Freq: Two times a day (BID) | ORAL | 3 refills | Status: DC

## 2022-01-26 NOTE — Telephone Encounter (Signed)
Patient informed to increase carvedilol to 12.5mg  twice daily and to stop her afternoon dose of chlorthalidone 15mg . She is to continue taking chlorthalidone 25mg  each morning and continue to monitor her BP. She repeated the medication doses back to me in her own words.

## 2022-01-26 NOTE — Telephone Encounter (Signed)
LMTCB

## 2022-01-26 NOTE — Telephone Encounter (Signed)
Patient returned call

## 2022-01-26 NOTE — Addendum Note (Signed)
Addended by: Velora Mediate F on: 01/26/2022 03:22 PM   Modules accepted: Orders

## 2022-01-26 NOTE — Telephone Encounter (Signed)
LMTCB. Recommended she check her phone settings to accept our clinic number.

## 2022-01-26 NOTE — Telephone Encounter (Signed)
LMTCB regarding medication changes.

## 2022-01-27 ENCOUNTER — Ambulatory Visit: Payer: BC Managed Care – PPO | Admitting: Sports Medicine

## 2022-02-15 ENCOUNTER — Other Ambulatory Visit: Payer: Self-pay | Admitting: Physician Assistant

## 2022-03-07 ENCOUNTER — Ambulatory Visit
Admission: EM | Admit: 2022-03-07 | Discharge: 2022-03-07 | Disposition: A | Discharge status: Home / Self Care | Payer: BC Managed Care – PPO | Attending: Family Medicine | Admitting: Family Medicine

## 2022-03-07 ENCOUNTER — Encounter: Payer: Self-pay | Admitting: Emergency Medicine

## 2022-03-07 ENCOUNTER — Ambulatory Visit (INDEPENDENT_AMBULATORY_CARE_PROVIDER_SITE_OTHER): Payer: BC Managed Care – PPO

## 2022-03-07 DIAGNOSIS — M79645 Pain in left finger(s): Secondary | ICD-10-CM | POA: Diagnosis not present

## 2022-03-07 DIAGNOSIS — R062 Wheezing: Secondary | ICD-10-CM | POA: Diagnosis not present

## 2022-03-07 DIAGNOSIS — I10 Essential (primary) hypertension: Secondary | ICD-10-CM

## 2022-03-07 DIAGNOSIS — S62502A Fracture of unspecified phalanx of left thumb, initial encounter for closed fracture: Secondary | ICD-10-CM

## 2022-03-07 DIAGNOSIS — J069 Acute upper respiratory infection, unspecified: Secondary | ICD-10-CM

## 2022-03-07 MED ORDER — PREDNISONE 20 MG PO TABS: 40.0000 mg | ORAL_TABLET | Freq: Every day | ORAL | 0 refills | Status: DC

## 2022-03-07 NOTE — ED Triage Notes (Addendum)
Pt is present today with cough and chest congestion x2 days ago  Pt is present today with left thumb swelling from hitting her thumb on the door. Pt states that she heard a pop in her thumb after hitting it against the door.

## 2022-03-07 NOTE — Discharge Instructions (Addendum)
Your blood pressure was noted to be elevated during your visit today. Go home now and take your blood pressure medications then check your blood pressure in a couple of hours. Keep your PCP appt on Dec 29th.  BP (!) 201/111   Pulse 82   Temp 98.8 F (37.1 C)   Resp 18   SpO2 93%   BP Readings from Last 3 Encounters:  03/07/22 (!) 201/111  01/20/22 134/82  01/20/22 138/86   Wear your splint at all times, except for showering, until you see the hand specialist.

## 2022-03-07 NOTE — ED Provider Notes (Addendum)
[facility]   [id number] 03/07/22 Arrival Time: 0920  ASSESSMENT & PLAN:  1. Closed avulsion fracture of phalanx of left thumb, initial encounter   2. Elevated blood pressure reading in office with diagnosis of hypertension   3. Viral URI   4. Wheezing    I have personally viewed the imaging studies ordered this visit. LEFT thumb: with apparent proximal avulsion fracture.  New Prescriptions   PREDNISONE (DELTASONE) 20 MG TABLET    Take 2 tablets (40 mg total) by mouth daily.   NO s/s of HTN urgency.  Orders Placed This Encounter  Procedures   DG Finger Thumb Left   Discharge Instructions      Your blood pressure was noted to be elevated during your visit today. Go home now and take your blood pressure medications then check your blood pressure in a couple of hours. Keep your PCP appt on Dec 29th.  BP (!) 201/111   Pulse 82   Temp 98.8 F (37.1 C)   Resp 18   SpO2 93%   BP Readings from Last 3 Encounters:  03/07/22 (!) 201/111  01/20/22 134/82  01/20/22 138/86   Wear your splint at all times, except for showering, until you see the hand specialist.     Recommend:  Follow-up Information     Schedule an appointment as soon as possible for a visit  with Bradly Bienenstock, MD.   Specialty: Orthopedic Surgery Contact information: [address] [place] [address] [phone]                Has PCP f/u Dec 29th to discuss BP.   Reviewed expectations re: course of current medical issues. Questions answered. Outlined signs and symptoms indicating need for more acute intervention. Patient verbalized understanding. After Visit Summary given.  SUBJECTIVE: History from: patient. Wendy Jensen is a 61 y.o. female who reports L thumb pain; hit against door 2 d ago; "felt pop" with lingering pain. Is left-handed. No extremity sensation changes or weakness. No tx PTA. Also with cough and chest congestion; abrupt onset; x 2  days. Afebrile. Feels she is wheezing at times. No current SOB or CP.  Past Surgical History:  Procedure Laterality Date   COLONOSCOPY     ESOPHAGEAL DILATION     TUBAL LIGATION     UPPER GI ENDOSCOPY      Increased blood pressure noted today. Reports that she is treated for HTN. She reports no chest pain on exertion, no dyspnea on exertion, no swelling of ankles, no orthostatic dizziness or lightheadedness, no orthopnea or paroxysmal nocturnal dyspnea, and no palpitations.  Social History   Tobacco Use  Smoking Status Former   Packs/day: 0.15   Types: Cigarettes   Quit date: 09/10/2018   Years since quitting: 3.4  Smokeless Tobacco Never   OBJECTIVE:  Vitals:   03/07/22 1101  BP: (!) 201/111  Pulse: 82  Resp: 18  Temp: 98.8 F (37.1 C)  SpO2: 93%    General appearance: alert; no distress HEENT: [place]; AT Neck: supple with FROM Resp: unlabored respirations; moderate wheezing bilaterally; active cough Extremities: LUE: warm with well perfused appearance; fairly well localized moderate tenderness over left proximal thumb; without gross deformities; swelling: none; bruising: none; thumb ROM: normal CV: brisk extremity capillary refill of LUE; 2+ radial pulse of LUE. Skin: warm and dry; no visible rashes Neurologic: gait normal; normal sensation and strength of LUE Psychological: alert and cooperative; normal mood and affect  Imaging: DG Finger  Thumb Left  Result Date: 03/07/2022 CLINICAL DATA:  Trauma.  Left thumb pain EXAM: LEFT THUMB 2+V COMPARISON:  None Available. FINDINGS: Cortical avulsion fracture along the ulnar aspect of the base of the left thumb proximal phalanx at the first MCP joint. No malalignment. No additional fractures. 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 Soft tissue swelling at the first M CP joint level. IMPRESSION: Cortical avulsion fracture along the ulnar aspect of the base of the left thumb proximal phalanx at the first MCP joint.  Electronically Signed   By: Duanne Guess D.O.   On: 03/07/2022 11:27      Allergies  Allergen Reactions   Amlodipine Benzoate Other (See Comments)    Patient reports this made her feel sick.   Amlodipine Besylate     Patient reports this makes her feel sick.   Hydralazine Other (See Comments)   Hydrochlorothiazide Other (See Comments)   Lisinopril Other (See Comments)    Patient reports this made her feel sick.   Losartan Other (See Comments)   Metoprolol Tartrate     Patient reports this makes her feel sick.   Spironolactone     Nausea     Past Medical History:  Diagnosis Date   GERD (gastroesophageal reflux disease)    Hypertension    Peptic ulcer    Reflux    Social History   Socioeconomic History   Marital status: Single    Spouse name: Not on file   Number of children: Not on file   Years of education: Not on file   Highest education level: Not on file  Occupational History   Not on file  Tobacco Use   Smoking status: Former    Packs/day: 0.15    Types: Cigarettes    Quit date: 09/10/2018    Years since quitting: 3.4   Smokeless tobacco: Never  Vaping Use   Vaping Use: Never used  Substance and Sexual Activity   Alcohol use: Yes    Comment: 1 glass beer per day   Drug use: No   Sexual activity: Not Currently  Other Topics Concern   Not on file  Social History Narrative   Not on file   Social Determinants of Health   Financial Resource Strain: Not on file  Food Insecurity: Not on file  Transportation Needs: Not on file  Physical Activity: Not on file  Stress: Not on file  Social Connections: Not on file   Family History  Problem Relation Age of Onset   Hypertension Father    Past Surgical History:  Procedure Laterality Date   COLONOSCOPY     ESOPHAGEAL DILATION     TUBAL LIGATION     UPPER GI ENDOSCOPY         Mardella Layman, MD 03/07/22 1503    Mardella Layman, MD 03/07/22 1504

## 2022-03-09 NOTE — Progress Notes (Signed)
Cardiology Clinic Note   Patient Name: Wendy Jensen Date of Encounter: 03/10/2022  Primary Care Provider:  Georganna Skeans, MD Primary Cardiologist:  Little Ishikawa, MD  Patient Profile    61 year old female  hx of hypertension, hyperlipidemia, peptic ulcer disease, and former smoker.  She has multiple drug allergies: Amlodipine, hydralazine, lisinopril, losartan, metoprolol, and HCTZ.  As such, blood pressure control has been challenging.  She is able to tolerate carvedilol and chlorthalidone.  Addition of spironolactone.  She was started on amlodipine but was unable to tolerate this and therefore this was listed as an allergy.   Repeat echocardiogram was planned to reevaluate her LV systolic function with known history of hypertension.  Repeat echo on 10/26/2021 revealed normal LVEF of 55%, there was mild focal basal septal left ventricular hypertrophy, and grade 1 diastolic dysfunction.  LV systolic pressure was normal. Last seen in the office on 01/20/2022 for resistant hypertension.   Past Medical History    Past Medical History:  Diagnosis Date   GERD (gastroesophageal reflux disease)    Hypertension    Peptic ulcer    Reflux    Past Surgical History:  Procedure Laterality Date   COLONOSCOPY     ESOPHAGEAL DILATION     TUBAL LIGATION     UPPER GI ENDOSCOPY      Allergies  Allergies  Allergen Reactions   Amlodipine Benzoate Other (See Comments)    Patient reports this made her feel sick.   Amlodipine Besylate     Patient reports this makes her feel sick.   Hydralazine Other (See Comments)   Hydrochlorothiazide Other (See Comments)   Lisinopril Other (See Comments)    Patient reports this made her feel sick.   Losartan Other (See Comments)   Metoprolol Tartrate     Patient reports this makes her feel sick.   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  On last office visit she was under a lot of stress working 2 jobs, and was finding it difficult to keep her blood pressure under control especially in the afternoon.  She was unable to tolerate spironolactone and this was discontinued, I increased her chlorthalidone to 25 mg in the morning and 25 mg in the afternoon which she was to take between both of her jobs around 2 PM.  The neck step would to be increasing her carvedilol to 12.5 mg twice daily if blood pressure is not well-controlled on additional dosing of chlorthalidone.  She called our office on 01/25/2022 stating that her blood pressure remained elevated and she was agitated because the chlorthalidone made her heart race.  At that time we increased her carvedilol to 12.5 mg twice daily and to stop afternoon dose of chlorthalidone but continue a.m. dose of 25 mg.  She is here for follow-up to evaluate her response to medications.  Of note, she was seen in the emergency room on 08/05/2021 in the setting of a closed avulsion fracture of the phalanx of the left thumb after hitting her hand against a door, viral upper respiratory infection, and was found to have elevated blood pressure at the time.  Blood pressure reading was 201/111.  She was also to follow-up with orthopedic surgery concerning left thumb fracture.  She comes today with elevated blood pressure.  She states that she did not have any refills on chlorthalidone 25  mg daily.  She has not been taking it but has been taking the higher dose of carvedilol 12.5 mg twice daily.  She is also being treated for respiratory infection and is on prednisone and inhalers.  She states she has been very stressed and jittery on the prednisone.  She admits also that her anxiety is higher lately.  Home Medications    Current Outpatient Medications  Medication Sig Dispense Refill   predniSONE (DELTASONE) 20 MG tablet Take 2 tablets (40 mg total) by mouth daily. 10 tablet 0   rosuvastatin  (CRESTOR) 20 MG tablet Take 1 tablet (20 mg total) by mouth daily. 90 tablet 3   carvedilol (COREG) 12.5 MG tablet Take 1 tablet (12.5 mg total) by mouth 2 (two) times daily. 180 tablet 3   chlorthalidone (HYGROTON) 25 MG tablet Take 1 tablet (25 mg total) by mouth daily. 90 tablet 3   No current facility-administered medications for this visit.     Family History    Family History  Problem Relation Age of Onset   Hypertension Father    She indicated that her mother is deceased. She indicated that her father is alive. She indicated that her maternal grandfather is alive.  Social History    Social History   Socioeconomic History   Marital status: Single    Spouse name: Not on file   Number of children: Not on file   Years of education: Not on file   Highest education level: Not on file  Occupational History   Not on file  Tobacco Use   Smoking status: Former    Packs/day: 0.15    Types: Cigarettes    Quit date: 09/10/2018    Years since quitting: 3.4   Smokeless tobacco: Never  Vaping Use   Vaping Use: Never used  Substance and Sexual Activity   Alcohol use: Yes    Comment: 1 glass beer per day   Drug use: No   Sexual activity: Not Currently  Other Topics Concern   Not on file  Social History Narrative   Not on file   Social Determinants of Health   Financial Resource Strain: Not on file  Food Insecurity: Not on file  Transportation Needs: Not on file  Physical Activity: Not on file  Stress: Not on file  Social Connections: Not on file  Intimate Partner Violence: Not on file     Review of Systems    General:  No chills, fever, night sweats or weight changes.  Cardiovascular:  No chest pain, dyspnea on exertion, edema, orthopnea, palpitations, paroxysmal nocturnal dyspnea. Dermatological: No rash, lesions/masses Respiratory: No cough, dyspnea Urologic: No hematuria, dysuria Abdominal:   No nausea, vomiting, diarrhea, bright red blood per rectum, melena, or  hematemesis Neurologic:  No visual changes, wkns, changes in mental status. All other systems reviewed and are otherwise negative except as noted above.     Physical Exam    VS:  BP (!) 178/84   Pulse 87   Ht 5\' 5"  (1.651 m)   Wt 153 lb 3.2 oz (69.5 kg)   SpO2 97%   BMI 25.49 kg/m  , BMI Body mass index is 25.49 kg/m.     GEN: Well nourished, well developed, in no acute distress. HEENT: normal. Neck: Supple, no JVD, carotid bruits, or masses. Cardiac: RRR, no murmurs, rubs, or gallops. No clubbing, cyanosis, edema.  Radials/DP/PT 2+ and equal bilaterally.  Respiratory:  Respirations regular and unlabored, clear to auscultation bilaterally. GI: Soft, nontender,  nondistended, BS + x 4. MS: no deformity or atrophy.  Brace to right hand and thumb. Skin: warm and dry, no rash. Neuro:  Strength and sensation are intact. Psych: Normal affect.  Accessory Clinical Findings      Lab Results  Component Value Date   WBC 6.9 03/30/2021   HGB 14.3 03/30/2021   HCT 41.4 03/30/2021   MCV 90 03/30/2021   PLT 311 03/30/2021   Lab Results  Component Value Date   CREATININE 0.67 12/02/2021   BUN 21 12/02/2021   NA 134 12/02/2021   K 3.7 12/02/2021   CL 96 12/02/2021   CO2 27 12/02/2021   Lab Results  Component Value Date   ALT 25 10/26/2021   AST 34 10/26/2021   ALKPHOS 79 10/26/2021   BILITOT 0.6 10/26/2021   Lab Results  Component Value Date   CHOL 166 10/26/2021   HDL 83 10/26/2021   LDLCALC 70 10/26/2021   LDLDIRECT 179 (H) 06/24/2019   TRIG 69 10/26/2021   CHOLHDL 2.0 10/26/2021    Lab Results  Component Value Date   HGBA1C 5.9 (H) 04/05/2020    Review of Prior Studies: Echocardiogram 10/26/2021  1. Left ventricular ejection fraction, by estimation, is 55%. The left  ventricle has normal function. The left ventricle has no regional wall  motion abnormalities. Mild focal basal septal left ventricular  hypertrophy. Left ventricular diastolic  parameters are  consistent with Grade I diastolic dysfunction (impaired  relaxation).   2. Right ventricular systolic function is normal. The right ventricular  size is normal. There is normal pulmonary artery systolic pressure. The  estimated right ventricular systolic pressure is 20.6 mmHg.   3. The mitral valve is normal in structure. Trivial mitral valve  regurgitation. No evidence of mitral stenosis.   4. The aortic valve is tricuspid. Aortic valve regurgitation is not  visualized. No aortic stenosis is present.   5. The inferior vena cava is normal in size with greater than 50%  respiratory variability, suggesting right atrial pressure of 3 mmHg.   Assessment & Plan   1.  Hypertension: Currently not well-controlled.  She has not been taking chlorthalidone as directed as she states she did not have any refills.  I did recheck her blood pressure after sitting and interviewing her, blood pressure 168/78.  Will send refills for chlorthalidone 25 mg in the a.m., she will continue on carvedilol 12.5 mg twice daily.  I believe some of this is related to anxiety and or use of prednisone for her current respiratory infection.  I have asked her to continue monitoring her blood pressure at home intermittently.  2.  Hypercholesterolemia: Currently on rosuvastatin 20 mg daily.  Follow-up lipids and LFTs on visit in February 2024 unless completed by PCP.  3.  Chronic anxiety: States she has a stressful work environment and has ongoing low-grade anxiety.  If persistent may need to discuss with PCP concerning help with management.    Current medicines are reviewed at length with the patient today.  I have spent 25 min's  dedicated to the care of this patient on the date of this encounter to include pre-visit review of records, assessment, management and diagnostic testing,with shared decision making.  Signed, Bettey MareKathryn M. Liborio NixonLawrence DNP, ANP, AACC   03/10/2022 2:31 PM      Office [phone] Fax [phone]  [phone]  Notice: This dictation was prepared with Dragon dictation along with smaller phrase technology. Any transcriptional errors that result from this process are unintentional  and may not be corrected upon review.  Will likely miss can she mention she just Dr. Gery Pray is her cardiologist so he may have seen her atrial initially but there are him she did not have to be seen for a year or so

## 2022-03-10 ENCOUNTER — Encounter: Payer: Self-pay | Admitting: Adult Health

## 2022-03-10 ENCOUNTER — Ambulatory Visit: Payer: BC Managed Care – PPO | Attending: Adult Health | Admitting: Adult Health

## 2022-03-10 VITALS — BP 178/84 | HR 87 | Ht 65.0 in | Wt 153.2 lb

## 2022-03-10 DIAGNOSIS — I10 Essential (primary) hypertension: Secondary | ICD-10-CM | POA: Diagnosis not present

## 2022-03-10 DIAGNOSIS — E782 Mixed hyperlipidemia: Secondary | ICD-10-CM

## 2022-03-10 DIAGNOSIS — F411 Generalized anxiety disorder: Secondary | ICD-10-CM | POA: Diagnosis not present

## 2022-03-10 DIAGNOSIS — Z79899 Other long term (current) drug therapy: Secondary | ICD-10-CM

## 2022-03-10 MED ORDER — CHLORTHALIDONE 25 MG PO TABS: 25.0000 mg | ORAL_TABLET | Freq: Every day | ORAL | 3 refills | Status: DC

## 2022-03-10 MED ORDER — CARVEDILOL 12.5 MG PO TABS: 12.5000 mg | ORAL_TABLET | Freq: Two times a day (BID) | ORAL | 3 refills | Status: DC

## 2022-03-10 NOTE — Patient Instructions (Signed)
Medication Instructions:  No Changes *If you need a refill on your cardiac medications before your next appointment, please call your pharmacy*   Lab Work: No Labs If you have labs (blood work) drawn today and your tests are completely normal, you will receive your results only by: MyChart Message (if you have MyChart) OR A paper copy in the mail If you have any lab test that is abnormal or we need to change your treatment, we will call you to review the results.   Testing/Procedures: No Testing   Follow-Up: At [facility], you and your health needs are our priority.  As part of our continuing mission to provide you with exceptional heart care, we have created designated Provider Care Teams.  These Care Teams include your primary Cardiologist (physician) and Advanced Practice Providers (APPs -  Physician Assistants and Nurse Practitioners) who all work together to provide you with the care you need, when you need it.  We recommend signing up for the patient portal called "MyChart".  Sign up information is provided on this After Visit Summary.  MyChart is used to connect with patients for Virtual Visits (Telemedicine).  Patients are able to view lab/test results, encounter notes, upcoming appointments, etc.  Non-urgent messages can be sent to your provider as well.   To learn more about what you can do with MyChart, go to [email or web address].    Your next appointment:   Follow up As Scheduled  The format for your next appointment:   In Person  Provider:   Little Ishikawa, MD

## 2022-03-16 ENCOUNTER — Telehealth: Payer: Self-pay | Admitting: Cardiology

## 2022-03-16 NOTE — Telephone Encounter (Signed)
Called pt and left message: Per Tommy Medal, RPH-CPP  She can take the chlorthalidone an hour or two after her morning dose of carvedilol and see if that helps.    Left a call back number  Also sent this message on MyChart

## 2022-03-16 NOTE — Telephone Encounter (Signed)
Spoke with patient about her s/s. She reports that yesterday morning was the first(?) time that she has taken her Carvedilol 12.5mg  and Chlorthalidone 25mg  at the same time. (She was out of her chlorthalidone for a while and just got it refilled) She said she felt dizzy, light-headed, head "throbbing/not pain", weak, just felt sick all day. She did not check her BP until 1930 and by that time it was 170/101 p 105- and she took her prescribed dose of cavedilol that evening. When she got up this morning, her BP was 159/96 p 85, she only took the Chlorthalidone 25mg  this morning because she was afraid if she took both, she would feel like she did yesterday. She took BP while on phone 1430= 151/101 p85- Patient states that she feels just fine right now. Denies any s/s.  She is wondering how she should "take these medications" moving forward due to her reaction yesterday. She is wondering if maybe she should spread out, take them a few hours apart? I told her that I would forward this info to her provider for recommendations.   I told her, that moving forward, please check your BP 1-2hrs after taking medication so we can see how your body is responding to the prescribed medications.

## 2022-03-16 NOTE — Telephone Encounter (Signed)
She has been on both medications since November, so not sure why she is suddenly having issues, unless she has been out of chlorthalidone that long.  She can take the chlorthalidone an hour or two after her morning dose of carvedilol and see if that helps.

## 2022-03-16 NOTE — Telephone Encounter (Signed)
Pt c/o medication issue:  1. Name of Medication: carvedilol (COREG) 12.5 MG tablet   2. How are you currently taking this medication (dosage and times per day)? As prescribed up until today   3. Are you having a reaction (difficulty breathing--STAT)? Yes  4. What is your medication issue? Patient is calling reporting she thinks her dose is too high for this medication. Prior to taking her second dose yesterday her BP was 170/101. After taking second dose yesterday she felt she was going to faint and was weak. Due to this she did not take carvedilol this morning and has felt better. Patient feels that her BP has came down, since not taking carvedilol today, but is unsure. She had just walked the dog at time of call so she is going to settle down and check it to report the reading once she is called back. Please advise.

## 2022-03-21 ENCOUNTER — Ambulatory Visit: Payer: BC Managed Care – PPO

## 2022-03-21 ENCOUNTER — Ambulatory Visit
Admission: RE | Admit: 2022-03-21 | Discharge: 2022-03-21 | Disposition: A | Discharge status: Home / Self Care | Payer: BC Managed Care – PPO | Source: Ambulatory Visit | Attending: Family Medicine | Admitting: Family Medicine

## 2022-03-21 DIAGNOSIS — N644 Mastodynia: Secondary | ICD-10-CM

## 2022-04-18 NOTE — Progress Notes (Deleted)
Cardiology Office Note:    Date:  06/06/2020   ID:  Mckennah Kretchmer, DOB [birth year], MRN [id number]  PCP:  Nicolette Bang, DO  Cardiologist:  No primary care provider on file.  Electrophysiologist:  None   Referring MD: Caryl Never*   Chief Complaint  Patient presents with   Hypertension    History of Present Illness:    61 Linn is a 62 y.o. female with a hx of hypertension, PUD, hyperlipidemia who presents for follow-up.  She was referred by Dr. Juleen China for evaluation of hypertension, initially seen on 04/05/2020.  She has history of allergy to amlodipine, hydralazine, lisinopril, losartan, and metoprolol.  HCTZ is also listed in allergies, but reports she tolerates OK.  States that she currently is only taking carvedilol 3.125 mg p.o. BID, but sometimes will take the 6.25 mg dose if her BP is elevated.  She is not taking hydrochlorothiazide and takes torsemide 5 mg only about 4 days/week.  She denies any shortness of breath.  Reports occasional chest pain that she describes as left-sided pain that last for few seconds and resolves.  Reports occasional lightheadedness.  Denies any syncope.  No lower extremity edema.  Quit smoking last year.  No known history of heart disease in her immediate family.  Since last clinic visit, , BP meds were changed to carvedilol and chlorthalidone.  She reports she is feeling significantly improved.  Reports some indigestion but otherwise denies any chest pain.  Denies any dyspnea.  Reports some lightheadedness in the morning before she eats breakfast.  Denies any lower extremity edema.  States that she has been compliant with her BP meds but is only taking her statin 3 to 4 days a week.   Past Medical History:  Diagnosis Date   GERD (gastroesophageal reflux disease)    Hypertension    Peptic ulcer    Reflux     Past Surgical History:  Procedure Laterality Date   COLONOSCOPY     ESOPHAGEAL DILATION     TUBAL  LIGATION     UPPER GI ENDOSCOPY      Current Medications: Current Meds  Medication Sig   chlorthalidone (HYGROTON) 25 MG tablet Take 1 tablet (25 mg total) by mouth daily.   rosuvastatin (CRESTOR) 20 MG tablet TAKE 1 TABLET(20 MG) BY MOUTH DAILY   sertraline (ZOLOFT) 50 MG tablet Take 1 tablet (50 mg total) by mouth daily.   [DISCONTINUED] carvedilol (COREG) 12.5 MG tablet Take 1 tablet (12.5 mg total) by mouth 2 (two) times daily.     Allergies:   Amlodipine benzoate, Amlodipine besylate, Hydralazine, Hydrochlorothiazide, Lisinopril, Losartan, and Metoprolol tartrate   Social History   Socioeconomic History   Marital status: Single    Spouse name: Not on file   Number of children: Not on file   Years of education: Not on file   Highest education level: Not on file  Occupational History   Not on file  Tobacco Use   Smoking status: Former Smoker    Packs/day: 0.15    Types: Cigarettes    Quit date: 09/10/2018    Years since quitting: 1.7   Smokeless tobacco: Never Used  Vaping Use   Vaping Use: Never used  Substance and Sexual Activity   Alcohol use: Yes    Comment: 1 glass beer per day   Drug use: No   Sexual activity: Not Currently  Other Topics Concern   Not on file  Social History Narrative   Not on  file   Social Determinants of Health   Financial Resource Strain: Not on file  Food Insecurity: Not on file  Transportation Needs: Not on file  Physical Activity: Not on file  Stress: Not on file  Social Connections: Not on file     Family History: The patient's family history includes Hypertension in her father.  ROS:   Please see the history of present illness.     All other systems reviewed and are negative.  EKGs/Labs/Other Studies Reviewed:    The following studies were reviewed today:   EKG:  EKG is ordered today.  The ekg ordered today demonstrates sinus rhythm, rate 65, first-degree AV block, nonspecific T wave flattening, QTC 418  Recent  Labs: 04/05/2020: ALT 14; Hemoglobin 12.9; Platelets 295; TSH 0.855 05/21/2020: BUN 19; Creatinine, Ser 0.73; Magnesium 2.3; Potassium 3.6; Sodium 140  Recent Lipid Panel    Component Value Date/Time   CHOL 261 (H) 04/05/2020 1542   TRIG 86 04/05/2020 1542   HDL 78 04/05/2020 1542   CHOLHDL 3.3 04/05/2020 1542   LDLCALC 169 (H) 04/05/2020 1542   LDLDIRECT 179 (H) 06/24/2019 1412    Physical Exam:    VS:  BP (!) 146/80   Pulse 65   Ht 5\' 4"  (1.626 m)   Wt 155 lb 6.4 oz (70.5 kg)   SpO2 99%   BMI 26.67 kg/m     Wt Readings from Last 3 Encounters:  06/04/20 155 lb 6.4 oz (70.5 kg)  05/04/20 163 lb (73.9 kg)  04/05/20 159 lb (72.1 kg)     GEN:  in no acute distress HEENT: Normal NECK: No JVD; No carotid bruits LYMPHATICS: No lymphadenopathy CARDIAC: RRR, no murmurs, rubs, gallops RESPIRATORY:  Clear to auscultation without rales, wheezing or rhonchi  ABDOMEN: Soft, non-tender, non-distended MUSCULOSKELETAL:  No edema; No deformity  SKIN: Warm and dry NEUROLOGIC:  Alert and oriented x 3 PSYCHIATRIC:  Normal affect   ASSESSMENT:    1. Essential hypertension   2. Mixed hyperlipidemia   3. Chest pain of uncertain etiology    PLAN:    Hypertension: Allergies to multiple antihypertensives.  Currently on carvedilol 12.5 mg twice daily and chlorthalidone 25 mg daily.  Hyperlipidemia: On rosuvastatin 20 mg daily.  LDL 70 hide 10/26/2021  Chest pain: Description suggest noncardiac chest pain, as describes for left-sided chest pain that lasts for few seconds and resolves.  No further cardiac work-up recommended at this time  RTC in***  Medication Adjustments/Labs and Tests Ordered: Current medicines are reviewed at length with the patient today.  Concerns regarding medicines are outlined above.  Orders Placed This Encounter  Procedures   EKG 12-Lead   Meds ordered this encounter  Medications   carvedilol (COREG) 25 MG tablet    Sig: Take 1 tablet (25 mg total) by  mouth 2 (two) times daily.    Dispense:  180 tablet    Refill:  3    Dose increase    Patient Instructions  Medication Instructions:  INCREASE carvedilol (Coreg) to 25 mg two times daily  *If you need a refill on your cardiac medications before your next appointment, please call your pharmacy*   Follow-Up: At [facility], you and your health needs are our priority.  As part of our continuing mission to provide you with exceptional heart care, we have created designated Provider Care Teams.  These Care Teams include your primary Cardiologist (physician) and Advanced Practice Providers (APPs -  Physician Assistants and Nurse Practitioners) who  all work together to provide you with the care you need, when you need it.  We recommend signing up for the patient portal called "MyChart".  Sign up information is provided on this After Visit Summary.  MyChart is used to connect with patients for Virtual Visits (Telemedicine).  Patients are able to view lab/test results, encounter notes, upcoming appointments, etc.  Non-urgent messages can be sent to your provider as well.   To learn more about what you can do with MyChart, go to [email or web address].    Your next appointment:   3 month(s)  The format for your next appointment:   In Person  Provider:   Oswaldo Milian, MD      Signed, Donato Heinz, MD  06/06/2020 2:03 PM    [place]

## 2022-04-21 ENCOUNTER — Ambulatory Visit: Payer: BC Managed Care – PPO | Attending: Cardiology | Admitting: Cardiology

## 2022-05-11 ENCOUNTER — Other Ambulatory Visit: Payer: Self-pay | Admitting: Cardiology

## 2022-05-11 DIAGNOSIS — I10 Essential (primary) hypertension: Secondary | ICD-10-CM

## 2022-09-25 ENCOUNTER — Ambulatory Visit (INDEPENDENT_AMBULATORY_CARE_PROVIDER_SITE_OTHER): Payer: BC Managed Care – PPO | Admitting: Family Medicine

## 2022-09-25 ENCOUNTER — Encounter: Payer: Self-pay | Admitting: Family Medicine

## 2022-09-25 VITALS — BP 159/98 | HR 66 | Temp 98.1°F | Resp 16 | Wt 148.8 lb

## 2022-09-25 DIAGNOSIS — F17210 Nicotine dependence, cigarettes, uncomplicated: Secondary | ICD-10-CM | POA: Diagnosis not present

## 2022-09-25 DIAGNOSIS — K219 Gastro-esophageal reflux disease without esophagitis: Secondary | ICD-10-CM

## 2022-09-25 DIAGNOSIS — E782 Mixed hyperlipidemia: Secondary | ICD-10-CM

## 2022-09-25 DIAGNOSIS — I10 Essential (primary) hypertension: Secondary | ICD-10-CM | POA: Diagnosis not present

## 2022-09-25 DIAGNOSIS — H029 Unspecified disorder of eyelid: Secondary | ICD-10-CM

## 2022-09-25 MED ORDER — ROSUVASTATIN CALCIUM 20 MG PO TABS: 20.0000 mg | ORAL_TABLET | Freq: Every day | ORAL | 1 refills | Status: DC

## 2022-09-25 MED ORDER — CHLORTHALIDONE 25 MG PO TABS: 25.0000 mg | ORAL_TABLET | Freq: Every day | ORAL | 1 refills | Status: DC

## 2022-09-25 MED ORDER — CARVEDILOL 25 MG PO TABS: 25.0000 mg | ORAL_TABLET | Freq: Two times a day (BID) | ORAL | 1 refills | Status: DC

## 2022-09-25 NOTE — Progress Notes (Unsigned)
   Established Patient Office Visit  Subjective    Patient ID: Tamaya Pun, female    DOB: 09-Jun-1960  Age: 62 y.o. MRN: [id number]  CC:  Chief Complaint  Patient presents with   Follow-up    HPI Saudia Munyan presents for follow up of chronic med issues. Patient denies acute complaints    Outpatient Encounter Medications as of 09/25/2022  Medication Sig   chlorthalidone (HYGROTON) 25 MG tablet Take 1 tablet (25 mg total) by mouth daily.   rosuvastatin (CRESTOR) 20 MG tablet Take 1 tablet (20 mg total) by mouth daily.   [DISCONTINUED] carvedilol (COREG) 12.5 MG tablet Take 1 tablet (12.5 mg total) by mouth 2 (two) times daily.   predniSONE (DELTASONE) 20 MG tablet Take 2 tablets (40 mg total) by mouth daily. (Patient not taking: Reported on 09/25/2022)   No facility-administered encounter medications on file as of 09/25/2022.    Past Medical History:  Diagnosis Date   GERD (gastroesophageal reflux disease)    Hypertension    Peptic ulcer    Reflux     Past Surgical History:  Procedure Laterality Date   COLONOSCOPY     ESOPHAGEAL DILATION     TUBAL LIGATION     UPPER GI ENDOSCOPY      Family History  Problem Relation Age of Onset   Hypertension Father     Social History   Socioeconomic History   Marital status: Single    Spouse name: Not on file   Number of children: Not on file   Years of education: Not on file   Highest education level: Not on file  Occupational History   Not on file  Tobacco Use   Smoking status: Former    Current packs/day: 0.00    Types: Cigarettes    Quit date: 09/10/2018    Years since quitting: 4.0   Smokeless tobacco: Never  Vaping Use   Vaping status: Never Used  Substance and Sexual Activity   Alcohol use: Yes    Comment: 1 glass beer per day   Drug use: No   Sexual activity: Not Currently  Other Topics Concern   Not on file  Social History Narrative   Not on file   Social Determinants of Health    Financial Resource Strain: Not on file  Food Insecurity: Not on file  Transportation Needs: Not on file  Physical Activity: Not on file  Stress: Not on file  Social Connections: Not on file  Intimate Partner Violence: Not on file    ROS      Objective    BP (!) 159/98   Pulse 66   Temp 98.1 F (36.7 C) (Oral)   Resp 16   Wt 148 lb 12.8 oz (67.5 kg)   SpO2 95%   BMI 24.76 kg/m   Physical Exam  {Labs (Optional):23779}    Assessment & Plan:   Problem List Items Addressed This Visit       Cardiovascular and Mediastinum   Essential hypertension - Primary     Digestive   GERD     Other   Mixed hyperlipidemia    No follow-ups on file.   Tommie Raymond, MD

## 2022-09-25 NOTE — Progress Notes (Unsigned)
Patient is here for their 6 month follow-up -Patient concern  about about having acid reflux  for awhile. - Patient has  possible stye on her left eye. Care gaps have been discussed with patient

## 2022-09-27 ENCOUNTER — Encounter: Payer: Self-pay | Admitting: Family Medicine

## 2022-09-27 ENCOUNTER — Telehealth: Payer: Self-pay | Admitting: Family Medicine

## 2022-09-27 NOTE — Telephone Encounter (Signed)
Copied from CRM [id number]. Topic: Referral - Request for Referral >> Sep 27, 2022 12:23 PM Patsy Lager T wrote: Reason for CRM: Darel Hong from [facility] called stated Dr Darcel Bayley is no longer there and was the only provider that could do eye lesions so they would not be able to accept patient  Has patient seen PCP for this complaint? Yes.   *If NO, is insurance requiring patient see PCP for this issue before PCP can refer them? Referral for which specialty:  OPHTHALMOLOGY Preferred provider/office: unknown Reason for referral: eye lesion

## 2022-10-03 NOTE — Telephone Encounter (Signed)
Thank you Nora!!!!!

## 2022-10-04 ENCOUNTER — Ambulatory Visit: Payer: BC Managed Care – PPO | Admitting: Family Medicine

## 2022-10-10 ENCOUNTER — Encounter: Payer: Self-pay | Admitting: *Deleted

## 2022-10-16 ENCOUNTER — Telehealth: Payer: Self-pay | Admitting: Family Medicine

## 2022-10-16 NOTE — Telephone Encounter (Signed)
Medication Refill - Medication: medication for acid reflux, patient mentioned this at the last visit with PCP,requesting something strong  Has the patient contacted their pharmacy? No.  Preferred Pharmacy (with phone number or street name):  [id number] [place], [place] - [address] AT [facility]  Phone: [phone] Fax: [phone]  Has the patient been seen for an appointment in the last year OR does the patient have an upcoming appointment? Yes.  Last seen PCP on 09/25/2022

## 2022-10-16 NOTE — Telephone Encounter (Signed)
Patient request medication for acid reflux. Patient states that medication was mentioned at last OV. Routing for review.

## 2022-10-18 ENCOUNTER — Telehealth: Payer: Self-pay | Admitting: Family Medicine

## 2022-10-18 NOTE — Telephone Encounter (Signed)
Patient would like to follow up on acid reflux medication request. Medication is not on current list. Patient states it was discussed at her last OV with PCP.

## 2022-10-18 NOTE — Telephone Encounter (Signed)
Medication Refill - Pt is calling to f/u on her refill request for acid reflux medication; the patient mentioned this at the last visit with PCP. Please advise.   Has the patient contacted their pharmacy? No. (Agent: If no, request that the patient contact the pharmacy for the refill. If patient does not wish to contact the pharmacy document the reason why and proceed with request.)   Preferred Pharmacy (with phone number or street name):  [facility] #[id number] [place], [place] - [address]  [address] [place] [address]  Phone: [phone] Fax: [phone]  Hours: Not open 24 hours   Has the patient been seen for an appointment in the last year OR does the patient have an upcoming appointment? Yes.    Agent: Please be advised that RX refills may take up to 3 business days. We ask that you follow-up with your pharmacy.

## 2022-10-24 ENCOUNTER — Telehealth: Payer: Self-pay | Admitting: Family Medicine

## 2022-10-24 NOTE — Telephone Encounter (Signed)
Patient would like a prescription for acid reflux sent to pharmacy on file

## 2022-10-25 NOTE — Telephone Encounter (Signed)
I have attempted without success to contact this patient by phone to return their call and I left a message on answering machine.

## 2022-10-26 ENCOUNTER — Other Ambulatory Visit: Payer: Self-pay | Admitting: Adult Health

## 2022-10-26 DIAGNOSIS — E782 Mixed hyperlipidemia: Secondary | ICD-10-CM

## 2022-11-06 ENCOUNTER — Other Ambulatory Visit: Payer: Self-pay | Admitting: Family Medicine

## 2022-11-06 MED ORDER — OMEPRAZOLE 20 MG PO CPDR: 20.0000 mg | DELAYED_RELEASE_CAPSULE | Freq: Every day | ORAL | 0 refills | Status: DC

## 2022-11-06 NOTE — Telephone Encounter (Signed)
Msg has been sent to provider via secure chat

## 2022-11-06 NOTE — Telephone Encounter (Signed)
done

## 2022-12-06 ENCOUNTER — Telehealth: Payer: Self-pay | Admitting: Cardiology

## 2022-12-06 NOTE — Telephone Encounter (Signed)
Pt has been scheduled to see Bernadene Person, NP, Friday, 12/08/22, clearance will be addressed at that time.  Will route back to the requesting provider's office to make them aware.

## 2022-12-06 NOTE — Telephone Encounter (Addendum)
    Primary Cardiologist:Christopher Karlyne Greenspan, MD  Chart reviewed as part of pre-operative protocol coverage. Because of Zairah Goranson's past medical history and time since last visit, he/she will require a follow-up visit in order to better assess preoperative cardiovascular risk.  Pre-op covering staff: - Please schedule Office appointment and call patient to inform them. - Please contact requesting surgeon's office via preferred method (i.e, phone, fax) to inform them of need for appointment prior to surgery.  If applicable, this message will also be routed to pharmacy pool and/or primary cardiologist for input on holding anticoagulant/antiplatelet agent as requested below so that this information is available at time of patient's appointment.   Ronney Asters, NP  12/06/2022, 12:31 PM

## 2022-12-06 NOTE — Telephone Encounter (Signed)
Pre-operative Risk Assessment    Patient Name: Wendy Jensen  DOB: [birth year] MRN: [id number]     Request for Surgical Clearance    Procedure:   9 teeth extractions, bone graph and alveoloplasty   Date of Surgery:  Clearance TBD                                 Surgeon:  Dr. Amedeo Kinsman Surgeon's Group or Practice Name:  [facility] Phone number:  [phone]  Fax number:  [phone]   Type of Clearance Requested:   - Medical  - Pharmacy:  Hold        Type of Anesthesia:  Local    Additional requests/questions:   Caller (Tammy) state they would be using Lidocaine or generic.  Caller stated they will need to know if patient has any contraindicated medication.  Signed, Annetta Maw   12/06/2022, 11:50 AM

## 2022-12-08 ENCOUNTER — Ambulatory Visit: Payer: BC Managed Care – PPO | Admitting: Nurse Practitioner

## 2022-12-15 NOTE — Progress Notes (Deleted)
Cardiology Clinic Note   Patient Name: Wendy Jensen Date of Encounter: 12/15/2022  Primary Care Provider:  Georganna Skeans, MD Primary Cardiologist:  Little Ishikawa, MD  Patient Profile    Wendy Jensen 62 year old female presents the clinic today for follow-up evaluation of her essential hypertension and preoperative cardiac evaluation.  Past Medical History    Past Medical History:  Diagnosis Date   GERD (gastroesophageal reflux disease)    Hypertension    Peptic ulcer    Reflux    Past Surgical History:  Procedure Laterality Date   COLONOSCOPY     ESOPHAGEAL DILATION     TUBAL LIGATION     UPPER GI ENDOSCOPY      Allergies  Allergies  Allergen Reactions   Amlodipine Benzoate Other (See Comments)    Patient reports this made her feel sick.   Amlodipine Besylate     Patient reports this makes her feel sick.   Hydralazine Other (See Comments)   Hydrochlorothiazide Other (See Comments)   Lisinopril Other (See Comments)    Patient reports this made her feel sick.   Losartan Other (See Comments)   Metoprolol Tartrate     Patient reports this makes her feel sick.   Spironolactone     Nausea     History of Present Illness    Wendy Jensen has a PMH of aortic calcification, hypertension, GERD, generalized anxiety, and hyperlipidemia.  She has multiple drug allergies including amlodipine, hydralazine, lisinopril, losartan, metoprolol, and HCTZ.  She was seen in follow-up by Joni Reining, DNP on 03/10/2022.  During that time she presented to the clinic with elevated blood pressure.  She reported that she did not have any refills on her chlorthalidone.  She had not been taking the chlorthalidone and was taking a higher dose of carvedilol.  She was also being treated for respiratory infection.  She indicated that she was taking inhalers and prednisone.  The prednisone was causing her to Jensen stressed and jittery.  She reported that her  anxiety was also increased.  Her blood pressure was noted to Jensen 178/84.  On recheck her blood pressure was noted to Jensen 168/78.  Her chlorthalidone was refilled.  Her carvedilol was continued Jensen 12.5 mg twice daily.  She was encouraged to continue to monitor her blood pressure.  Essential hypertension-BP today***. Maintain blood pressure log Continue current medical therapy Heart healthy low-sodium diet-salty 6 diet sheet given  Hyperlipidemia-LDL***. Continue rosuvastatin High-fiber diet Follows with PCP    Preoperative cardiac evaluation-extraction of 9 teeth, Dr. Lafayette Dragon, [facility], fax number [phone]    Primary Cardiologist: Little Ishikawa, MD  Chart reviewed as part of pre-operative protocol coverage. Given past medical history and time since last visit, based on ACC/AHA guidelines, Wendy Jensen.   She does not require SBE prophylaxis.  Patient was advised that if he/she*** develops new symptoms prior to surgery to contact our office to arrange a follow-up appointment.  He verbalized understanding.   Disposition: Follow-up with Dr. Bjorn Pippin, Bailey Mech, or me in 9-12 months.    Home Medications    Prior to Admission medications   Medication Sig Start Date End Date Taking? Authorizing Provider  carvedilol (COREG) 25 MG tablet Take 1 tablet (25 mg total) by mouth 2 (two) times daily with a meal. 09/25/22   Georganna Skeans, MD  chlorthalidone (HYGROTON) 25 MG tablet Take 1 tablet (25 mg total) by  mouth daily. 09/25/22   Georganna Skeans, MD  omeprazole (PRILOSEC) 20 MG capsule Take 1 capsule (20 mg total) by mouth daily. 11/06/22   Georganna Skeans, MD  predniSONE (DELTASONE) 20 MG tablet Take 2 tablets (40 mg total) by mouth daily. Patient not taking: Reported on 09/25/2022 03/07/22   Mardella Layman, MD  rosuvastatin (CRESTOR) 20 MG tablet TAKE 1 TABLET(20 MG)  BY MOUTH DAILY 10/26/22   Jodelle Gross, NP    Family History    Family History  Problem Relation Age of Onset   Hypertension Father    She indicated that her mother is deceased. She indicated that her father is alive. She indicated that her maternal grandfather is alive.  Social History    Social History   Socioeconomic History   Marital status: Single    Spouse name: Not on file   Number of children: Not on file   Years of education: Not on file   Highest education level: Not on file  Occupational History   Not on file  Tobacco Use   Smoking status: Former    Current packs/day: 0.00    Types: Cigarettes    Quit date: 09/10/2018    Years since quitting: 4.2   Smokeless tobacco: Never  Vaping Use   Vaping status: Never Used  Substance and Sexual Activity   Alcohol use: Yes    Comment: 1 glass beer per day   Drug use: No   Sexual activity: Not Currently  Other Topics Concern   Not on file  Social History Narrative   Not on file   Social Determinants of Health   Financial Resource Strain: Not on file  Food Insecurity: Not on file  Transportation Needs: Not on file  Physical Activity: Not on file  Stress: Not on file  Social Connections: Not on file  Intimate Partner Violence: Not on file     Review of Systems    General:  No chills, fever, night sweats or weight changes.  Cardiovascular:  No chest pain, dyspnea on exertion, edema, orthopnea, palpitations, paroxysmal nocturnal dyspnea. Dermatological: No rash, lesions/masses Respiratory: No cough, dyspnea Urologic: No hematuria, dysuria Abdominal:   No nausea, vomiting, diarrhea, bright red blood per rectum, melena, or hematemesis Neurologic:  No visual changes, wkns, changes in mental status. All other systems reviewed and are otherwise negative except as noted above.  Physical Exam    VS:  There were no vitals taken for this visit. , BMI There is no height or weight on file to calculate BMI. GEN:  Well nourished, well developed, in no acute distress. HEENT: normal. Neck: Supple, no JVD, carotid bruits, or masses. Cardiac: RRR, no murmurs, rubs, or gallops. No clubbing, cyanosis, edema.  Radials/DP/PT 2+ and equal bilaterally.  Respiratory:  Respirations regular and unlabored, clear to auscultation bilaterally. GI: Soft, nontender, nondistended, BS + x 4. MS: no deformity or atrophy. Skin: warm and dry, no rash. Neuro:  Strength and sensation are intact. Psych: Normal affect.  Accessory Clinical Findings    Recent Labs: No results found for requested labs within last 365 days.   Recent Lipid Panel    Component Value Date/Time   CHOL 166 10/26/2021 0917   TRIG 69 10/26/2021 0917   HDL 83 10/26/2021 0917   CHOLHDL 2.0 10/26/2021 0917   LDLCALC 70 10/26/2021 0917   LDLDIRECT 179 (H) 06/24/2019 1412    No BP recorded.  {Refresh Note OR Click here to enter BP  :1}***  ECG personally reviewed by me today- ***     Echocardiogram 10/26/2021  1. Left ventricular ejection fraction, by estimation, is 55%. The left  ventricle has normal function. The left ventricle has no regional wall  motion abnormalities. Mild focal basal septal left ventricular  hypertrophy. Left ventricular diastolic  parameters are consistent with Grade I diastolic dysfunction (impaired  relaxation).   2. Right ventricular systolic function is normal. The right ventricular  size is normal. There is normal pulmonary artery systolic pressure. The  estimated right ventricular systolic pressure is 20.6 mmHg.   3. The mitral valve is normal in structure. Trivial mitral valve  regurgitation. No evidence of mitral stenosis.   4. The aortic valve is tricuspid. Aortic valve regurgitation is not  visualized. No aortic stenosis is present.   5. The inferior vena cava is normal in size with greater than 50%  respiratory variability, suggesting right atrial pressure of 3 mmHg.    Assessment & Plan   1.   ***   Thomasene Ripple. Kenneisha Cochrane NP-C     12/15/2022, 7:08 AM [facility] [address] Office [phone] Fax [phone]    I spent***minutes examining this patient, reviewing medications, and using patient centered shared decision making involving her cardiac care.  Prior to her visit I spent greater than 20 minutes reviewing her past medical history,  medications, and prior cardiac tests.

## 2022-12-18 ENCOUNTER — Ambulatory Visit: Payer: BC Managed Care – PPO | Admitting: General Practice

## 2022-12-25 NOTE — Progress Notes (Unsigned)
Cardiology Clinic Note   Patient Name: Wendy Jensen Date of Encounter: 12/28/2022  Primary Care Provider:  Georganna Skeans, MD Primary Cardiologist:  Little Ishikawa, MD  Patient Profile    Wendy Jensen 62 year old female presents the clinic today for follow-up evaluation of her essential hypertension and preoperative cardiac evaluation.  Past Medical History    Past Medical History:  Diagnosis Date   GERD (gastroesophageal reflux disease)    Hypertension    Peptic ulcer    Reflux    Past Surgical History:  Procedure Laterality Date   COLONOSCOPY     ESOPHAGEAL DILATION     TUBAL LIGATION     UPPER GI ENDOSCOPY      Allergies  Allergies  Allergen Reactions   Amlodipine Benzoate Other (See Comments)    Patient reports this made her feel sick.   Amlodipine Besylate     Patient reports this makes her feel sick.   Hydralazine Other (See Comments)   Hydrochlorothiazide Other (See Comments)   Lisinopril Other (See Comments)    Patient reports this made her feel sick.   Losartan Other (See Comments)   Metoprolol Tartrate     Patient reports this makes her feel sick.   Spironolactone     Nausea     History of Present Illness    Wendy Jensen has a PMH of aortic calcification, hypertension, GERD, generalized anxiety, and hyperlipidemia.  She has multiple drug allergies including amlodipine, hydralazine, lisinopril, losartan, metoprolol, and HCTZ.  She was seen in follow-up by Joni Reining, DNP on 03/10/2022.  During that time she presented to the clinic with elevated blood pressure.  She reported that she did not have any refills on her chlorthalidone.  She had not been taking the chlorthalidone and was taking a higher dose of carvedilol.  She was also being treated for respiratory infection.  She indicated that she was taking inhalers and prednisone.  The prednisone was causing her to be stressed and jittery.  She reported that her  anxiety was also increased.  Her blood pressure was noted to be 178/84.  On recheck her blood pressure was noted to be 168/78.  Her chlorthalidone was refilled.  Her carvedilol was continued at 12.5 mg twice daily.  She was encouraged to continue to monitor her blood pressure.  She presents to the clinic today for follow-up evaluation and states she continues to work 2 jobs.  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  We reviewed secondary causes of hypertension.  Her blood pressure today initially was 142/84 and on recheck is 138/78.  She has been taking her chlorthalidone in the evening and reports taking carvedilol twice daily.  I have asked her to start taking her chlorthalidone in the morning.  She continues to be stable from a cardiac standpoint.  She is able to complete greater than 4 METS of physical activity.  Will plan follow-up in 12 months.  I will order CBC, CMP, and lipid panel.  Today she denies chest pain, shortness of breath, lower extremity edema, fatigue, palpitations, melena, hematuria, hemoptysis, diaphoresis, weakness, presyncope, syncope, orthopnea, and PND.  Home Medications    Prior to Admission medications   Medication Sig Start Date End Date Taking? Authorizing Provider  carvedilol (COREG) 25 MG tablet Take 1 tablet (25 mg total) by mouth 2 (two) times daily with a meal. 09/25/22   Georganna Skeans, MD  chlorthalidone (HYGROTON) 25 MG tablet Take 1  tablet (25 mg total) by mouth daily. 09/25/22   Georganna Skeans, MD  omeprazole (PRILOSEC) 20 MG capsule Take 1 capsule (20 mg total) by mouth daily. 11/06/22   Georganna Skeans, MD  predniSONE (DELTASONE) 20 MG tablet Take 2 tablets (40 mg total) by mouth daily. Patient not taking: Reported on 09/25/2022 03/07/22   Mardella Layman, MD  rosuvastatin (CRESTOR) 20 MG tablet TAKE 1 TABLET(20 MG) BY MOUTH DAILY 10/26/22   Jodelle Gross, NP    Family History    Family History  Problem  Relation Age of Onset   Hypertension Father    She indicated that her mother is deceased. She indicated that her father is alive. She indicated that her maternal grandfather is alive.  Social History    Social History   Socioeconomic History   Marital status: Single    Spouse name: Not on file   Number of children: Not on file   Years of education: Not on file   Highest education level: Not on file  Occupational History   Not on file  Tobacco Use   Smoking status: Former    Current packs/day: 0.00    Types: Cigarettes    Quit date: 09/10/2018    Years since quitting: 4.3   Smokeless tobacco: Never  Vaping Use   Vaping status: Never Used  Substance and Sexual Activity   Alcohol use: Yes    Comment: 1 glass beer per day   Drug use: No   Sexual activity: Not Currently  Other Topics Concern   Not on file  Social History Narrative   Not on file   Social Determinants of Health   Financial Resource Strain: Not on file  Food Insecurity: Not on file  Transportation Needs: Not on file  Physical Activity: Not on file  Stress: Not on file  Social Connections: Not on file  Intimate Partner Violence: Not on file     Review of Systems    General:  No chills, fever, night sweats or weight changes.  Cardiovascular:  No chest pain, dyspnea on exertion, edema, orthopnea, palpitations, paroxysmal nocturnal dyspnea. Dermatological: No rash, lesions/masses Respiratory: No cough, dyspnea Urologic: No hematuria, dysuria Abdominal:   No nausea, vomiting, diarrhea, bright red blood per rectum, melena, or hematemesis Neurologic:  No visual changes, wkns, changes in mental status. All other systems reviewed and are otherwise negative except as noted above.  Physical Exam    VS:  BP 138/78   Pulse 61   Ht 5\' 4"  (1.626 m)   Wt 143 lb 6.4 oz (65 kg)   SpO2 98%   BMI 24.61 kg/m  , BMI Body mass index is 24.61 kg/m. GEN: Well nourished, well developed, in no acute distress. HEENT:  normal. Neck: Supple, no JVD, carotid bruits, or masses. Cardiac: RRR, no murmurs, rubs, or gallops. No clubbing, cyanosis, edema.  Radials/DP/PT 2+ and equal bilaterally.  Respiratory:  Respirations regular and unlabored, clear to auscultation bilaterally. GI: Soft, nontender, nondistended, BS + x 4. MS: no deformity or atrophy. Skin: warm and dry, no rash. Neuro:  Strength and sensation are intact. Psych: Normal affect.  Accessory Clinical Findings    Recent Labs: No results found for requested labs within last 365 days.   Recent Lipid Panel    Component Value Date/Time   CHOL 166 10/26/2021 0917   TRIG 69 10/26/2021 0917   HDL 83 10/26/2021 0917   CHOLHDL 2.0 10/26/2021 0917   LDLCALC 70 10/26/2021 0917   LDLDIRECT  179 (H) 06/24/2019 1412         ECG personally reviewed by me today- EKG Interpretation Date/Time:  Thursday December 28 2022 09:52:15 EDT Ventricular Rate:  58 PR Interval:  212 QRS Duration:  82 QT Interval:  428 QTC Calculation: 420 R Axis:   80  Text Interpretation: Sinus bradycardia with 1st degree A-V block T wave abnormality, consider anterior ischemia Confirmed by Edd Fabian [phone]) on 12/28/2022 10:02:31 AM    Echocardiogram 10/26/2021  1. Left ventricular ejection fraction, by estimation, is 55%. The left  ventricle has normal function. The left ventricle has no regional wall  motion abnormalities. Mild focal basal septal left ventricular  hypertrophy. Left ventricular diastolic  parameters are consistent with Grade I diastolic dysfunction (impaired  relaxation).   2. Right ventricular systolic function is normal. The right ventricular  size is normal. There is normal pulmonary artery systolic pressure. The  estimated right ventricular systolic pressure is 20.6 mmHg.   3. The mitral valve is normal in structure. Trivial mitral valve  regurgitation. No evidence of mitral stenosis.   4. The aortic valve is tricuspid. Aortic valve  regurgitation is not  visualized. No aortic stenosis is present.   5. The inferior vena cava is normal in size with greater than 50%  respiratory variability, suggesting right atrial pressure of 3 mmHg.    Assessment & Plan   1.  Essential hypertension-BP today 142/82 and on recheck 138/78.. Maintain blood pressure log Continue current medical therapy Heart healthy low-sodium diet-salty 6 diet sheet given CMP, CBC  Hyperlipidemia-LDL 70 on 10/26/2021. Continue rosuvastatin High-fiber diet Repeat lipid  Preoperative cardiac evaluation-extraction of 9 teeth, Dr. Lafayette Dragon, [facility], fax number [phone]    Primary Cardiologist: Little Ishikawa, MD  Chart reviewed as part of pre-operative protocol coverage. Given past medical history and time since last visit, based on ACC/AHA guidelines, Wendy Jensen would be at acceptable risk for the planned procedure without further cardiovascular testing.   She does not require SBE prophylaxis.  Patient was advised that if she develops new symptoms prior to surgery to contact our office to arrange a follow-up appointment.  She verbalized understanding.   Disposition: Follow-up with Dr. Bjorn Pippin, Bailey Mech, or me in 9-12 months.    Wendy Jensen. Joon Pohle NP-C     12/28/2022, 10:28 AM [facility] [address] Office [phone] Fax [phone]    I spent 14 minutes examining this patient, reviewing medications, and using patient centered shared decision making involving her cardiac care.  Prior to her visit I spent greater than 20 minutes reviewing her past medical history,  medications, and prior cardiac tests.

## 2022-12-26 ENCOUNTER — Ambulatory Visit: Payer: BC Managed Care – PPO | Admitting: Family Medicine

## 2022-12-28 ENCOUNTER — Ambulatory Visit: Payer: BC Managed Care – PPO | Attending: Nurse Practitioner | Admitting: General Practice

## 2022-12-28 ENCOUNTER — Encounter: Payer: Self-pay | Admitting: General Practice

## 2022-12-28 VITALS — BP 138/78 | HR 61 | Ht 64.0 in | Wt 143.4 lb

## 2022-12-28 DIAGNOSIS — E782 Mixed hyperlipidemia: Secondary | ICD-10-CM | POA: Diagnosis not present

## 2022-12-28 DIAGNOSIS — Z01818 Encounter for other preprocedural examination: Secondary | ICD-10-CM

## 2022-12-28 DIAGNOSIS — Z01810 Encounter for preprocedural cardiovascular examination: Secondary | ICD-10-CM | POA: Diagnosis not present

## 2022-12-28 DIAGNOSIS — I10 Essential (primary) hypertension: Secondary | ICD-10-CM

## 2022-12-28 MED ORDER — CHLORTHALIDONE 25 MG PO TABS: 25.0000 mg | ORAL_TABLET | Freq: Every day | ORAL | 1 refills | Status: DC

## 2022-12-28 NOTE — Patient Instructions (Signed)
Medication Instructions:  The current medical regimen is effective;  continue present plan and medications as directed. Please refer to the Current Medication list given to you today.  *If you need a refill on your cardiac medications before your next appointment, please call your pharmacy*  Lab Work: CBC, CMET AND LIPID-TODAY If you have labs (blood work) drawn today and your tests are completely normal, you will receive your results only by: MyChart Message (if you have MyChart) OR  A paper copy in the mail If you have any lab test that is abnormal or we need to change your treatment, we will call you to review the results.  Other Instructions TAKE AND LOG YOU BLOOD PRESSURE PLEASE READ AND FOLLOW ATTACHED  SALTY 6  Follow-Up: At [facility], you and your health needs are our priority.  As part of our continuing mission to provide you with exceptional heart care, we have created designated Provider Care Teams.  These Care Teams include your primary Cardiologist (physician) and Advanced Practice Providers (APPs -  Physician Assistants and Nurse Practitioners) who all work together to provide you with the care you need, when you need it.  Your next appointment:   12 month(s)  Provider:   Little Ishikawa, MD

## 2022-12-29 LAB — COMPREHENSIVE METABOLIC PANEL
ALT: 23 [IU]/L (ref 0–32)
AST: 34 [IU]/L (ref 0–40)
Albumin: 4.1 g/dL (ref 3.9–4.9)
Alkaline Phosphatase: 81 [IU]/L (ref 44–121)
BUN/Creatinine Ratio: 36 — ABNORMAL HIGH (ref 12–28)
BUN: 19 mg/dL (ref 8–27)
Bilirubin Total: 0.4 mg/dL (ref 0.0–1.2)
CO2: 29 mmol/L (ref 20–29)
Calcium: 9.2 mg/dL (ref 8.7–10.3)
Chloride: 98 mmol/L (ref 96–106)
Creatinine, Ser: 0.53 mg/dL — ABNORMAL LOW (ref 0.57–1.00)
Globulin, Total: 2.8 g/dL (ref 1.5–4.5)
Glucose: 107 mg/dL — ABNORMAL HIGH (ref 70–99)
Potassium: 3.7 mmol/L (ref 3.5–5.2)
Sodium: 141 mmol/L (ref 134–144)
Total Protein: 6.9 g/dL (ref 6.0–8.5)
eGFR: 105 mL/min/{1.73_m2} (ref >59)

## 2022-12-29 LAB — LIPID PANEL
Chol/HDL Ratio: 2.3 {ratio} (ref 0.0–4.4)
Cholesterol, Total: 198 mg/dL (ref 100–199)
HDL: 86 mg/dL (ref >39)
LDL Chol Calc (NIH): 95 mg/dL (ref 0–99)
Triglycerides: 95 mg/dL (ref 0–149)
VLDL Cholesterol Cal: 17 mg/dL (ref 5–40)

## 2022-12-29 LAB — CBC
Hematocrit: 38.5 % (ref 34.0–46.6)
Hemoglobin: 12.4 g/dL (ref 11.1–15.9)
MCH: 30.9 pg (ref 26.6–33.0)
MCHC: 32.2 g/dL (ref 31.5–35.7)
MCV: 96 fL (ref 79–97)
Platelets: 259 10*3/uL (ref 150–450)
RBC: 4.01 x10E6/uL (ref 3.77–5.28)
RDW: 12.1 % (ref 11.7–15.4)
WBC: 5.2 10*3/uL (ref 3.4–10.8)

## 2023-01-01 ENCOUNTER — Telehealth: Payer: Self-pay | Admitting: General Practice

## 2023-01-01 NOTE — Telephone Encounter (Signed)
  Ronney Asters, NP 12/29/2022  8:56 AM EDT     Please contact Ms. Righter and let her know that her lab work is been reviewed.  Her CBC and CMP are stable at this time.  Her LDL cholesterol was elevated at 95.  Please verify that she continues to take her rosuvastatin.  Thank you.    She reports that she has not been consistent with taking her Rosuvastatin, but will do better- will be more consistent now. She verbalized understanding of results.

## 2023-01-01 NOTE — Telephone Encounter (Signed)
Patient is returning LPN's call for lab results. Please advise.

## 2023-01-16 ENCOUNTER — Ambulatory Visit: Payer: BC Managed Care – PPO | Admitting: Family Medicine

## 2023-02-02 ENCOUNTER — Encounter: Payer: Self-pay | Admitting: Family

## 2023-02-02 ENCOUNTER — Ambulatory Visit (INDEPENDENT_AMBULATORY_CARE_PROVIDER_SITE_OTHER): Payer: BC Managed Care – PPO | Admitting: Family

## 2023-02-02 VITALS — BP 143/85 | HR 79 | Temp 98.3°F | Ht 64.0 in | Wt 144.0 lb

## 2023-02-02 DIAGNOSIS — L600 Ingrowing nail: Secondary | ICD-10-CM

## 2023-02-02 NOTE — Progress Notes (Unsigned)
Patient wants to talk about blood pressure medication.

## 2023-02-02 NOTE — Progress Notes (Unsigned)
Patient ID: Wendy Jensen, female    DOB: 11/03/1960  MRN: [id number]  CC: Referral   Subjective: Wendy Jensen is a 62 y.o. female who presents for referral.   Her concerns today include:  - Requests referral to Podiatry for ingrown toenails bilaterally.  - Established with Cardiology for chronic conditions management.   Patient Active Problem List   Diagnosis Date Noted   Skin lesion of foot 11/30/2020   Esophageal spasm 11/30/2020   Aortic calcification (HCC) 06/25/2019   Osteoarthritis of cervicothoracic spine 06/25/2019   Epidermoid cyst 04/30/2018   Mixed hyperlipidemia 12/10/2017   Allergic rhinitis 07/31/2017   Generalized anxiety disorder 07/31/2017   Nicotine dependence 07/31/2017   Essential hypertension 09/10/2008   GERD 09/10/2008     Current Outpatient Medications on File Prior to Visit  Medication Sig Dispense Refill   carvedilol (COREG) 25 MG tablet Take 1 tablet (25 mg total) by mouth 2 (two) times daily with a meal. 180 tablet 1   chlorthalidone (HYGROTON) 25 MG tablet Take 1 tablet (25 mg total) by mouth daily. 90 tablet 1   omeprazole (PRILOSEC) 20 MG capsule Take 1 capsule (20 mg total) by mouth daily. 90 capsule 0   rosuvastatin (CRESTOR) 20 MG tablet TAKE 1 TABLET(20 MG) BY MOUTH DAILY 90 tablet 1   No current facility-administered medications on file prior to visit.    Allergies  Allergen Reactions   Amlodipine Benzoate Other (See Comments)    Patient reports this made her feel sick.   Amlodipine Besylate     Patient reports this makes her feel sick.   Hydralazine Other (See Comments)   Hydrochlorothiazide Other (See Comments)   Lisinopril Other (See Comments)    Patient reports this made her feel sick.   Losartan Other (See Comments)   Metoprolol Tartrate     Patient reports this makes her feel sick.   Spironolactone     Nausea     Social History   Socioeconomic History   Marital status: Single    Spouse name: Not on  file   Number of children: Not on file   Years of education: Not on file   Highest education level: Not on file  Occupational History   Not on file  Tobacco Use   Smoking status: Former    Current packs/day: 0.00    Types: Cigarettes    Quit date: 09/10/2018    Years since quitting: 4.4   Smokeless tobacco: Never  Vaping Use   Vaping status: Never Used  Substance and Sexual Activity   Alcohol use: Yes    Comment: 1 glass beer per day   Drug use: No   Sexual activity: Not Currently  Other Topics Concern   Not on file  Social History Narrative   Not on file   Social Determinants of Health   Financial Resource Strain: Not on file  Food Insecurity: Not on file  Transportation Needs: Not on file  Physical Activity: Not on file  Stress: Not on file  Social Connections: Not on file  Intimate Partner Violence: Not on file    Family History  Problem Relation Age of Onset   Hypertension Father     Past Surgical History:  Procedure Laterality Date   COLONOSCOPY     ESOPHAGEAL DILATION     TUBAL LIGATION     UPPER GI ENDOSCOPY      ROS: Review of Systems Negative except as stated above  PHYSICAL EXAM: BP (!) 143/85  Pulse 79   Temp 98.3 F (36.8 C) (Oral)   Ht 5\' 4"  (1.626 m)   Wt 144 lb (65.3 kg)   SpO2 94%   BMI 24.72 kg/m   Physical Exam HENT:     Head: Normocephalic and atraumatic.     Nose: Nose normal.     Mouth/Throat:     Mouth: Mucous membranes are moist.     Pharynx: Oropharynx is clear.  Eyes:     Extraocular Movements: Extraocular movements intact.     Conjunctiva/sclera: Conjunctivae normal.     Pupils: Pupils are equal, round, and reactive to light.  Cardiovascular:     Rate and Rhythm: Normal rate and regular rhythm.     Pulses: Normal pulses.     Heart sounds: Normal heart sounds.  Pulmonary:     Effort: Pulmonary effort is normal.     Breath sounds: Normal breath sounds.  Musculoskeletal:        General: Normal range of motion.      Cervical back: Normal range of motion and neck supple.  Feet:     Right foot:     Toenail Condition: Right toenails are ingrown.     Left foot:     Toenail Condition: Left toenails are ingrown.  Neurological:     General: No focal deficit present.     Mental Status: She is alert and oriented to person, place, and time.  Psychiatric:        Mood and Affect: Mood normal.        Behavior: Behavior normal.   ASSESSMENT AND PLAN: 1. Ingrown toenail of both feet - Referral to Podiatry for evaluation/management.  - Ambulatory referral to Podiatry    Patient was given the opportunity to ask questions.  Patient verbalized understanding of the plan and was able to repeat key elements of the plan. Patient was given clear instructions to go to Emergency Department or return to medical center if symptoms don't improve, worsen, or new problems develop.The patient verbalized understanding.   Orders Placed This Encounter  Procedures   Ambulatory referral to Podiatry    Return in 1 week (on 02/09/2023) for Follow-Up or next available blood pressure check with Georganna Skeans, MD .  Rema Fendt, NP

## 2023-02-06 ENCOUNTER — Other Ambulatory Visit: Payer: Self-pay | Admitting: Family Medicine

## 2023-02-12 ENCOUNTER — Ambulatory Visit: Payer: BC Managed Care – PPO | Admitting: Family Medicine

## 2023-02-12 ENCOUNTER — Encounter: Payer: Self-pay | Admitting: Family Medicine

## 2023-02-12 VITALS — BP 153/96 | HR 69 | Temp 98.3°F | Resp 16 | Ht 65.0 in | Wt 143.4 lb

## 2023-02-12 DIAGNOSIS — F17210 Nicotine dependence, cigarettes, uncomplicated: Secondary | ICD-10-CM

## 2023-02-12 DIAGNOSIS — I10 Essential (primary) hypertension: Secondary | ICD-10-CM | POA: Diagnosis not present

## 2023-02-12 DIAGNOSIS — K219 Gastro-esophageal reflux disease without esophagitis: Secondary | ICD-10-CM

## 2023-02-12 DIAGNOSIS — E782 Mixed hyperlipidemia: Secondary | ICD-10-CM | POA: Diagnosis not present

## 2023-02-12 NOTE — Progress Notes (Unsigned)
Established Patient Office Visit  Subjective    Patient ID: Wendy Jensen, female    DOB: 1960-11-27  Age: 61 y.o. MRN: [id number]  CC:  Chief Complaint  Patient presents with   Blood Pressure Check    HPI Wendy Jensen presents for routine follow up of chronic issues including hypertension. She reports that she is doing well and taking meds as recommended. She denies acute complaints.   Outpatient Encounter Medications as of 02/12/2023  Medication Sig   carvedilol (COREG) 25 MG tablet Take 1 tablet (25 mg total) by mouth 2 (two) times daily with a meal.   chlorthalidone (HYGROTON) 25 MG tablet Take 1 tablet (25 mg total) by mouth daily.   omeprazole (PRILOSEC) 20 MG capsule TAKE 1 CAPSULE(20 MG) BY MOUTH DAILY   rosuvastatin (CRESTOR) 20 MG tablet TAKE 1 TABLET(20 MG) BY MOUTH DAILY   No facility-administered encounter medications on file as of 02/12/2023.    Past Medical History:  Diagnosis Date   GERD (gastroesophageal reflux disease)    Hypertension    Peptic ulcer    Reflux     Past Surgical History:  Procedure Laterality Date   COLONOSCOPY     ESOPHAGEAL DILATION     TUBAL LIGATION     UPPER GI ENDOSCOPY      Family History  Problem Relation Age of Onset   Hypertension Father     Social History   Socioeconomic History   Marital status: Single    Spouse name: Not on file   Number of children: Not on file   Years of education: Not on file   Highest education level: Not on file  Occupational History   Not on file  Tobacco Use   Smoking status: Former    Current packs/day: 0.00    Types: Cigarettes    Quit date: 09/10/2018    Years since quitting: 4.4   Smokeless tobacco: Never  Vaping Use   Vaping status: Never Used  Substance and Sexual Activity   Alcohol use: Yes    Comment: 1 glass beer per day   Drug use: No   Sexual activity: Not Currently  Other Topics Concern   Not on file  Social History Narrative   Not on file   Social  Determinants of Health   Financial Resource Strain: Not on file  Food Insecurity: Not on file  Transportation Needs: Not on file  Physical Activity: Not on file  Stress: Stress Concern Present (02/12/2023)   [employer] of Occupational Health - Occupational Stress Questionnaire    Feeling of Stress : Very much  Social Connections: Not on file  Intimate Partner Violence: Not on file    Review of Systems  All other systems reviewed and are negative.       Objective    BP (!) 153/96 (BP Location: Right Arm, Patient Position: Sitting, Cuff Size: Normal)   Pulse 69   Temp 98.3 F (36.8 C) (Oral)   Resp 16   Ht 5\' 5"  (1.651 m)   Wt 143 lb 6.4 oz (65 kg)   SpO2 98%   BMI 23.86 kg/m   Physical Exam Vitals and nursing note reviewed.  Constitutional:      General: She is not in acute distress. Cardiovascular:     Rate and Rhythm: Normal rate and regular rhythm.  Pulmonary:     Effort: Pulmonary effort is normal.     Breath sounds: Normal breath sounds.  Abdominal:     Palpations:  Abdomen is soft.     Tenderness: There is no abdominal tenderness.  Musculoskeletal:     Right lower leg: No edema.     Left lower leg: No edema.  Neurological:     General: No focal deficit present.     Mental Status: She is alert and oriented to person, place, and time.         Assessment & Plan:   Essential hypertension  Mixed hyperlipidemia  Gastroesophageal reflux disease, unspecified whether esophagitis present  Cigarette nicotine dependence without complication     Return in about 4 weeks (around 03/12/2023) for follow up.   Tommie Raymond, MD

## 2023-02-14 ENCOUNTER — Encounter: Payer: Self-pay | Admitting: Family Medicine

## 2023-02-19 ENCOUNTER — Ambulatory Visit: Payer: BC Managed Care – PPO | Admitting: Family Medicine

## 2023-02-21 ENCOUNTER — Telehealth: Payer: Self-pay | Admitting: Cardiology

## 2023-02-21 DIAGNOSIS — I10 Essential (primary) hypertension: Secondary | ICD-10-CM

## 2023-02-21 DIAGNOSIS — Z79899 Other long term (current) drug therapy: Secondary | ICD-10-CM

## 2023-02-21 NOTE — Telephone Encounter (Signed)
Attempted to call patient, no answer, voicemail not set up unable to leave a message.

## 2023-02-21 NOTE — Telephone Encounter (Signed)
Pt c/o BP issue: STAT if pt c/o blurred vision, one-sided weakness or slurred speech  1. What are your last 5 BP readings?  153/92 172/90  2. Are you having any other symptoms (ex. Dizziness, headache, blurred vision, passed out)? Patient states she is having anxiety, has a lot on her mind.   3. What is your BP issue? Patient states her blood pressure is high.

## 2023-02-22 ENCOUNTER — Ambulatory Visit: Payer: BC Managed Care – PPO | Admitting: Podiatry

## 2023-02-22 ENCOUNTER — Encounter: Payer: Self-pay | Admitting: Podiatry

## 2023-02-22 DIAGNOSIS — L600 Ingrowing nail: Secondary | ICD-10-CM | POA: Diagnosis not present

## 2023-02-22 MED ORDER — HYDROCODONE-ACETAMINOPHEN 10-325 MG PO TABS: 1.0000 | ORAL_TABLET | Freq: Three times a day (TID) | ORAL | 0 refills | Status: AC | PRN

## 2023-02-22 NOTE — Patient Instructions (Signed)

## 2023-02-22 NOTE — Progress Notes (Signed)
Subjective:   Patient ID: Wendy Jensen, female   DOB: 62 y.o.   MRN: [id number]   HPI Patient presents with severely thickened hallux nails bilateral that are painful when pressed and make shoe gear difficult with other nails being deformed but not to the same degree.  States these have been an ongoing problem for a long time and she has tried trimming tried other modalities without relief and does not currently smoke and likes to be active   Review of Systems  All other systems reviewed and are negative.       Objective:  Physical Exam Vitals and nursing note reviewed.  Constitutional:      Appearance: She is well-developed.  Pulmonary:     Effort: Pulmonary effort is normal.  Musculoskeletal:        General: Normal range of motion.  Skin:    General: Skin is warm.  Neurological:     Mental Status: She is alert.     Neurovascular status intact muscle strength adequate range of motion adequate with severely dystrophic painful curled big toenails bilateral painful when pressed with other nails having deformity in them that are localized.  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  Allowed her to read then signed consent form for the big toenails and went ahead today and infiltrated each big toe 60 mg like Marcaine mixture sterile prep done and then went ahead and removed the hallux nails exposed root and applied phenol 5 applications 30 seconds followed by alcohol lavage sterile dressing gave instructions on soaks and to wear dressings for 24 hours taking them off earlier if throbbing were to occur.  All questions answered and patient will call if any  issues were to occur did write for hydrocodone for the postoperative recovery process

## 2023-02-22 NOTE — Telephone Encounter (Signed)
Patient identification verified by 2 forms. Wendy Rail, RN    Called and spoke to patient  Patient states:   -BP has been increasing   -BP yesterday 170/90 (after taking medication)   -has not checked BP today   -continues to take carvedilol 25mg  BID   -continues to take chlorthalidone 25mg  daily   -she does not have exact BP readings, does not write it down   -This week lowest BP ~143/80  -has not developed any headaches, chest pressure/pain or SOB  Patient denies:   -headaches   -chest pressure/pain   -SOB  Informed patient message sent to provider for input/advisement  Patient verbalized understanding, no questions at this time

## 2023-02-23 ENCOUNTER — Telehealth: Payer: Self-pay | Admitting: Podiatry

## 2023-02-23 NOTE — Telephone Encounter (Signed)
Pt called and was questioning what ointment you were talking about in the AVS.  I explained it was for just a neosporin or similar otc ointment.   She also asked about the rx medication and it was sent to the correct pharmacy. She said she did not get a call from them and I showed that pharmacy did confirm receipt. She is to call the pharmacy.

## 2023-02-26 NOTE — Telephone Encounter (Signed)
Called patient and unable to leave VM on pt's personal cell. Order placed as order per Dr. Bjorn Pippin. Will continue to f/u with patient

## 2023-02-26 NOTE — Telephone Encounter (Signed)
She will need an additional antihypertensive but has multiple allergies, recommend scheduling in pharmacy hypertension clinic next available.  Would ask her to bring BP log and home monitor to calibrate to appointment

## 2023-04-18 ENCOUNTER — Encounter: Payer: Self-pay | Admitting: Pharmacist

## 2023-04-18 ENCOUNTER — Ambulatory Visit: Payer: 59 | Attending: Cardiology | Admitting: Pharmacist

## 2023-04-18 VITALS — BP 115/77 | HR 74

## 2023-04-18 DIAGNOSIS — E669 Obesity, unspecified: Secondary | ICD-10-CM | POA: Insufficient documentation

## 2023-04-18 DIAGNOSIS — R1013 Epigastric pain: Secondary | ICD-10-CM | POA: Insufficient documentation

## 2023-04-18 DIAGNOSIS — R109 Unspecified abdominal pain: Secondary | ICD-10-CM | POA: Insufficient documentation

## 2023-04-18 DIAGNOSIS — R634 Abnormal weight loss: Secondary | ICD-10-CM | POA: Insufficient documentation

## 2023-04-18 DIAGNOSIS — F1010 Alcohol abuse, uncomplicated: Secondary | ICD-10-CM | POA: Insufficient documentation

## 2023-04-18 DIAGNOSIS — Z1211 Encounter for screening for malignant neoplasm of colon: Secondary | ICD-10-CM | POA: Insufficient documentation

## 2023-04-18 DIAGNOSIS — I10 Essential (primary) hypertension: Secondary | ICD-10-CM | POA: Diagnosis not present

## 2023-04-18 DIAGNOSIS — R140 Abdominal distension (gaseous): Secondary | ICD-10-CM | POA: Insufficient documentation

## 2023-04-18 DIAGNOSIS — R1011 Right upper quadrant pain: Secondary | ICD-10-CM | POA: Insufficient documentation

## 2023-04-18 DIAGNOSIS — R1310 Dysphagia, unspecified: Secondary | ICD-10-CM | POA: Insufficient documentation

## 2023-04-18 NOTE — Progress Notes (Signed)
 Patient ID: Stephnie Parlier                 DOB: [birth year]                      MRN: [id number]     HPI: Bretta Sansone is a 63 y.o. female referred by Dr. Kate to HTN clinic. PMH is significant for HTN, aortic calcifications, anxiety and multiple medication side effects.  Patient presents today with grandson. Grandson and daughter have moved in with her. This has not helped her underlying anxiety.  Is diligent about avoiding salty foods and has been working on her diet. Has been able to lose weight over past few years.  Works for [employer]. Awakens @ 4am and is asleep by 8pm. Her lunch break at work is early (9am) and that is when she takes her chlorthalidone .  Drinks alcohol nightly. Typically one beer and liquor.  Ran out of medications yesterday so picked up carvedilol  after work today and took before appointment (approximately 1-2 hours ago).  Current HTN meds: Chlorthalidone  25mg  daily Carvedilol  25mg  BID   BP goal: <130/80  Wt Readings from Last 3 Encounters:  02/12/23 143 lb 6.4 oz (65 kg)  02/02/23 144 lb (65.3 kg)  12/28/22 143 lb 6.4 oz (65 kg)   BP Readings from Last 3 Encounters:  02/12/23 (!) 153/96  02/02/23 (!) 143/85  12/28/22 138/78   Pulse Readings from Last 3 Encounters:  02/12/23 69  02/02/23 79  12/28/22 61    Renal function: CrCl cannot be calculated (Patient's most recent lab result is older than the maximum 21 days allowed.).  Past Medical History:  Diagnosis Date   GERD (gastroesophageal reflux disease)    Hypertension    Peptic ulcer    Reflux     Current Outpatient Medications on File Prior to Visit  Medication Sig Dispense Refill   carvedilol  (COREG ) 25 MG tablet Take 1 tablet (25 mg total) by mouth 2 (two) times daily with a meal. 180 tablet 1   chlorthalidone  (HYGROTON ) 25 MG tablet Take 1 tablet (25 mg total) by mouth daily. 90 tablet 1   omeprazole  (PRILOSEC) 20 MG capsule TAKE 1 CAPSULE(20 MG) BY MOUTH  DAILY 90 capsule 0   rosuvastatin  (CRESTOR ) 20 MG tablet TAKE 1 TABLET(20 MG) BY MOUTH DAILY 90 tablet 1   No current facility-administered medications on file prior to visit.    Allergies  Allergen Reactions   Amlodipine  Benzoate Other (See Comments)    Patient reports this made her feel sick.   Amlodipine  Besylate     Patient reports this makes her feel sick.   Hydralazine  Other (See Comments)   Hydrochlorothiazide  Other (See Comments)   Lisinopril Other (See Comments)    Patient reports this made her feel sick.   Losartan Other (See Comments)   Metoprolol Tartrate     Patient reports this makes her feel sick.   Spironolactone      Nausea      Assessment/Plan:  1. Hypertension -  Patient BP well controlled in room today at 115/77. First reading 123/83.  Adjusting her medication schedule today appears to have been effective for her. Will have her take initial carvedilol  when she awakens, chlorthalidone  at her 9am break, and second carvedilol  when she arrives home from work. Patient agreeable to plan. Recommend following up with PCP regarding underlying anxiety.  Continue chlorthalidone  25mg  once daily Continue carvedilol  25mg  BID  Chris Willamina Grieshop, PharmD, BCACP, CDCES,  CPP [address] [place] [address] [place], [place], [address] Phone: [phone], Fax: [phone]

## 2023-04-18 NOTE — Patient Instructions (Addendum)
 It was nice meeting you today  We would like your blood pressure to be less than 130/80  Your blood pressure looks great today  Continue your carvedilol  25mg  in the morning and evening with your chlorthalidone  25mg  in between around 9am  Continue to monitor your blood pressure at home but not frequently enough to make you worry  Contact your PCP regarding your anxiety medications  Chris Rana Adorno, PharmD, BCACP, CDCES, CPP [address], [address] [place], [place], [address] Phone: [phone], Fax: [phone]

## 2023-05-08 ENCOUNTER — Other Ambulatory Visit: Payer: Self-pay | Admitting: Family Medicine

## 2023-05-08 DIAGNOSIS — Z1231 Encounter for screening mammogram for malignant neoplasm of breast: Secondary | ICD-10-CM

## 2023-05-12 ENCOUNTER — Other Ambulatory Visit: Payer: Self-pay

## 2023-05-12 ENCOUNTER — Emergency Department (HOSPITAL_COMMUNITY)
Admission: EM | Admit: 2023-05-12 | Discharge: 2023-05-12 | Disposition: A | Discharge status: Home / Self Care | Attending: Emergency Medicine | Admitting: Emergency Medicine

## 2023-05-12 ENCOUNTER — Encounter (HOSPITAL_COMMUNITY): Payer: Self-pay | Admitting: Emergency Medicine

## 2023-05-12 ENCOUNTER — Emergency Department (HOSPITAL_COMMUNITY)

## 2023-05-12 DIAGNOSIS — F1290 Cannabis use, unspecified, uncomplicated: Secondary | ICD-10-CM | POA: Insufficient documentation

## 2023-05-12 DIAGNOSIS — R0602 Shortness of breath: Secondary | ICD-10-CM | POA: Diagnosis present

## 2023-05-12 DIAGNOSIS — J40 Bronchitis, not specified as acute or chronic: Secondary | ICD-10-CM | POA: Diagnosis not present

## 2023-05-12 LAB — RESP PANEL BY RT-PCR (RSV, FLU A&B, COVID)  RVPGX2
Influenza A by PCR: NEGATIVE
Influenza B by PCR: NEGATIVE
Resp Syncytial Virus by PCR: NEGATIVE
SARS Coronavirus 2 by RT PCR: NEGATIVE

## 2023-05-12 MED ORDER — PREDNISONE 10 MG PO TABS: 60.0000 mg | ORAL_TABLET | Freq: Every day | ORAL | 0 refills | Status: AC

## 2023-05-12 MED ORDER — ALBUTEROL SULFATE HFA 108 (90 BASE) MCG/ACT IN AERS
2.0000 | INHALATION_SPRAY | Freq: Once | RESPIRATORY_TRACT | Status: AC
  Administered 2023-05-12: 2 via RESPIRATORY_TRACT
  Filled 2023-05-12: qty 6.7

## 2023-05-12 MED ORDER — ALBUTEROL SULFATE HFA 108 (90 BASE) MCG/ACT IN AERS: 2.0000 | INHALATION_SPRAY | RESPIRATORY_TRACT | Status: DC | PRN

## 2023-05-12 NOTE — ED Triage Notes (Signed)
 Pt bib ems from home. Pt started having SOB while eating dinner. EMS reported pt O2 76% on RA with inspiratory and expiratory wheezing. Started on 10 mg albuterol, 1 mg atrovan , 120solumedrol , . Wheezing improved with sats at 96 on neb 8L. Hr 72 Bp 124/64 Cap 28 20 lac Hx bronchitis

## 2023-05-12 NOTE — Discharge Instructions (Addendum)
 Please make every effort to quit smoking, which can be irritating to your lungs.  Follow-up with your primary care doctor.  You can use your inhaler at home taking 2 puffs as needed every 4-6 hours for the next few days for shortness of breath.

## 2023-05-12 NOTE — ED Provider Notes (Signed)
 [place] EMERGENCY DEPARTMENT AT [facility] Provider Note   CSN: [id number] Arrival date & time: 05/12/23  1829     History  Chief Complaint  Patient presents with   Shortness of Breath    Wendy Jensen is a 63 y.o. female with a long-term history of smoking, emphysema, presenting to ED with shortness of breath, diarrhea.  Patient reports this began abruptly this evening after she had had a cocktail.  She began to feel that there was swelling in her nose and then was wheezing and short of breath.  She says she had this sensation she needed to have diarrhea and did so with that as well.  She denies fevers, chills.  Denies sick contacts in the house.  EMS gave the patient 3 rounds of DuoNebs, 125 mg of IV Solu-Medrol, and 2 g of IV magnesium for suspected COPD exacerbation.  On arrival the patient is reporting she is feeling "much much better".  She does not use inhalers at home.  She continues to smoke mostly marijuana on a daily basis.  HPI     Home Medications Prior to Admission medications   Medication Sig Start Date End Date Taking? Authorizing Provider  predniSONE (DELTASONE) 10 MG tablet Take 6 tablets (60 mg total) by mouth daily with breakfast for 4 days. 05/13/23 05/17/23 Yes Lena Gores, Kermit Balo, MD  carvedilol (COREG) 25 MG tablet Take 1 tablet (25 mg total) by mouth 2 (two) times daily with a meal. 09/25/22   Georganna Skeans, MD  chlorthalidone (HYGROTON) 25 MG tablet Take 1 tablet (25 mg total) by mouth daily. 12/28/22   Ronney Asters, NP  omeprazole (PRILOSEC) 20 MG capsule TAKE 1 CAPSULE(20 MG) BY MOUTH DAILY 02/06/23   Georganna Skeans, MD  rosuvastatin (CRESTOR) 20 MG tablet TAKE 1 TABLET(20 MG) BY MOUTH DAILY 10/26/22   Jodelle Gross, NP      Allergies    Amlodipine benzoate, Amlodipine besylate, Hydralazine, Hydrochlorothiazide, Lisinopril, Losartan, Metoprolol tartrate, and Spironolactone    Review of Systems   Review of Systems  Physical  Exam Updated Vital Signs BP 118/74 (BP Location: Right Arm)   Pulse 69   Temp 98 F (36.7 C) (Oral)   Resp 15   Ht 5\' 4"  (1.626 m)   Wt 65.8 kg   SpO2 96%   BMI 24.89 kg/m  Physical Exam Constitutional:      General: She is not in acute distress. HENT:     Head: Normocephalic and atraumatic.  Eyes:     Conjunctiva/sclera: Conjunctivae normal.     Pupils: Pupils are equal, round, and reactive to light.  Cardiovascular:     Rate and Rhythm: Normal rate and regular rhythm.  Pulmonary:     Effort: Pulmonary effort is normal. No respiratory distress.     Comments: 92-94% on room air Abdominal:     General: There is no distension.     Tenderness: There is no abdominal tenderness.  Skin:    General: Skin is warm and dry.  Neurological:     General: No focal deficit present.     Mental Status: She is alert. Mental status is at baseline.  Psychiatric:        Mood and Affect: Mood normal.        Behavior: Behavior normal.     ED Results / Procedures / Treatments   Labs (all labs ordered are listed, but only abnormal results are displayed) Labs Reviewed  RESP PANEL BY RT-PCR (RSV,  FLU A&B, COVID)  [id number]    EKG EKG Interpretation Date/Time:  Saturday May 12 2023 18:43:27 EST Ventricular Rate:  73 PR Interval:  215 QRS Duration:  90 QT Interval:  408 QTC Calculation: 450 R Axis:   67  Text Interpretation: Sinus rhythm Borderline prolonged PR interval Confirmed by Alvester Chou [phone]) on 05/12/2023 7:30:48 PM  Radiology DG Chest 2 View Result Date: 05/12/2023 CLINICAL DATA:  Shortness of breath while eating dinner. Inspiratory and expiratory wheezing. EXAM: CHEST - 2 VIEW COMPARISON:  None available. FINDINGS: Cardiomediastinal silhouette and pulmonary vasculature are within normal limits. Lungs are clear. IMPRESSION: No acute cardiopulmonary process. Electronically Signed   By: Acquanetta Belling M.D.   On: 05/12/2023 19:54    Procedures Procedures    Medications  Ordered in ED Medications  albuterol (VENTOLIN HFA) 108 (90 Base) MCG/ACT inhaler 2 puff (has no administration in time range)  albuterol (VENTOLIN HFA) 108 (90 Base) MCG/ACT inhaler 2 puff (2 puffs Inhalation Given 05/12/23 1954)    ED Course/ Medical Decision Making/ A&P                                 Medical Decision Making Amount and/or Complexity of Data Reviewed Radiology: ordered.  Risk Prescription drug management.   This patient presents to the ED with concern for shortness of breath, diarrhea. This involves an extensive number of treatment options, and is a complaint that carries with it a high risk of complications and morbidity.  The differential diagnosis includes viral illness versus bronchitis versus COPD exacerbation versus bacterial pneumonia versus other  Co-morbidities that complicate the patient evaluation: History of smoking and emphysema  Additional history obtained from EMS   I ordered and personally interpreted labs.  The pertinent results include: COVID and flu are negative  I ordered imaging studies including x-ray of the chest I independently visualized and interpreted imaging which showed no emergent findings I agree with the radiologist interpretation  The patient was maintained on a cardiac monitor.  I personally viewed and interpreted the cardiac monitored which showed an underlying rhythm of: Sinus rhythm  Per my interpretation the patient's ECG shows no acute ischemic findings  I ordered medication including albuterol inhaler for use in the ED and home use  I have reviewed the patients home medicines and have made adjustments as needed  Test Considered: Low suspicion for acute PE.  No indication for CT angiogram at this time   After the interventions noted above, I reevaluated the patient and found that they have: improved  Social Determinants of Health: counseled on importance of smoking cessation  Dispostion:  Will treat for potential  bronchitis with additional 4 days of steroids.  Patient also with albuterol inhaler to go home.  But she is breathing comfortably at this time with no wheezing on reassessment.   After consideration of the diagnostic results and the patients response to treatment, I feel that the patent would benefit from close outpatient follow-up.         Final Clinical Impression(s) / ED Diagnoses Final diagnoses:  Bronchitis    Rx / DC Orders ED Discharge Orders          Ordered    predniSONE (DELTASONE) 10 MG tablet  Daily with breakfast        05/12/23 2107              Terald Sleeper, MD 05/12/23 2151

## 2023-05-15 ENCOUNTER — Ambulatory Visit (INDEPENDENT_AMBULATORY_CARE_PROVIDER_SITE_OTHER): Payer: BC Managed Care – PPO | Admitting: Family Medicine

## 2023-05-15 ENCOUNTER — Encounter: Payer: Self-pay | Admitting: Family Medicine

## 2023-05-15 VITALS — BP 149/82 | HR 69 | Temp 98.2°F | Resp 16 | Ht 65.0 in | Wt 150.0 lb

## 2023-05-15 DIAGNOSIS — K219 Gastro-esophageal reflux disease without esophagitis: Secondary | ICD-10-CM | POA: Diagnosis not present

## 2023-05-15 DIAGNOSIS — Z789 Other specified health status: Secondary | ICD-10-CM

## 2023-05-15 DIAGNOSIS — I10 Essential (primary) hypertension: Secondary | ICD-10-CM | POA: Diagnosis not present

## 2023-05-15 DIAGNOSIS — E782 Mixed hyperlipidemia: Secondary | ICD-10-CM | POA: Diagnosis not present

## 2023-05-15 MED ORDER — OMEPRAZOLE 20 MG PO CPDR: 20.0000 mg | DELAYED_RELEASE_CAPSULE | Freq: Every day | ORAL | 1 refills | Status: DC

## 2023-05-15 MED ORDER — ROSUVASTATIN CALCIUM 20 MG PO TABS: 20.0000 mg | ORAL_TABLET | Freq: Every day | ORAL | 1 refills | Status: DC

## 2023-05-15 MED ORDER — CARVEDILOL 25 MG PO TABS: 25.0000 mg | ORAL_TABLET | Freq: Two times a day (BID) | ORAL | 1 refills | Status: DC

## 2023-05-15 MED ORDER — CHLORTHALIDONE 25 MG PO TABS: 25.0000 mg | ORAL_TABLET | Freq: Every day | ORAL | 1 refills | Status: DC

## 2023-05-16 ENCOUNTER — Encounter: Payer: Self-pay | Admitting: Family Medicine

## 2023-05-16 ENCOUNTER — Telehealth: Payer: Self-pay | Admitting: *Deleted

## 2023-05-16 NOTE — Progress Notes (Signed)
 Complex Care Management Note Care Guide Note  05/16/2023 Name: Wendy Jensen MRN: [id number] DOB: [birth year]   Complex Care Management Outreach Attempts: An unsuccessful telephone outreach was attempted today to offer the patient information about available complex care management services.  Follow Up Plan:  Additional outreach attempts will be made to offer the patient complex care management information and services.   Encounter Outcome:  No Answer  [name]  [facility]  [facility] Guide  Direct Dial: [phone]  Fax [phone]

## 2023-05-16 NOTE — Progress Notes (Signed)
 Established Patient Office Visit  Subjective    Patient ID: Wendy Jensen, female    DOB: 05/20/1960  Age: 63 y.o. MRN: [id number]  CC:  Chief Complaint  Patient presents with   Follow-up    3 month    HPI Kristianne Trnka presents for routine follow up of chronic med issues including hypertension and GERD. Patient reports med compliance and denies acute complaints.   Outpatient Encounter Medications as of 05/15/2023  Medication Sig   predniSONE (DELTASONE) 10 MG tablet Take 6 tablets (60 mg total) by mouth daily with breakfast for 4 days.   [DISCONTINUED] carvedilol (COREG) 25 MG tablet Take 1 tablet (25 mg total) by mouth 2 (two) times daily with a meal.   [DISCONTINUED] chlorthalidone (HYGROTON) 25 MG tablet Take 1 tablet (25 mg total) by mouth daily.   [DISCONTINUED] omeprazole (PRILOSEC) 20 MG capsule TAKE 1 CAPSULE(20 MG) BY MOUTH DAILY   [DISCONTINUED] rosuvastatin (CRESTOR) 20 MG tablet TAKE 1 TABLET(20 MG) BY MOUTH DAILY   carvedilol (COREG) 25 MG tablet Take 1 tablet (25 mg total) by mouth 2 (two) times daily with a meal.   chlorthalidone (HYGROTON) 25 MG tablet Take 1 tablet (25 mg total) by mouth daily.   omeprazole (PRILOSEC) 20 MG capsule Take 1 capsule (20 mg total) by mouth daily.   rosuvastatin (CRESTOR) 20 MG tablet Take 1 tablet (20 mg total) by mouth daily.   No facility-administered encounter medications on file as of 05/15/2023.    Past Medical History:  Diagnosis Date   GERD (gastroesophageal reflux disease)    Hypertension    Peptic ulcer    Reflux     Past Surgical History:  Procedure Laterality Date   COLONOSCOPY     ESOPHAGEAL DILATION     TUBAL LIGATION     UPPER GI ENDOSCOPY      Family History  Problem Relation Age of Onset   Hypertension Father     Social History   Socioeconomic History   Marital status: Single    Spouse name: Not on file   Number of children: Not on file   Years of education: Not on file   Highest  education level: Not on file  Occupational History   Not on file  Tobacco Use   Smoking status: Former    Current packs/day: 0.00    Types: Cigarettes    Quit date: 09/10/2018    Years since quitting: 4.6   Smokeless tobacco: Never  Vaping Use   Vaping status: Never Used  Substance and Sexual Activity   Alcohol use: Yes    Comment: 1 glass beer per day   Drug use: No   Sexual activity: Not Currently  Other Topics Concern   Not on file  Social History Narrative   Not on file   Social Drivers of Health   Financial Resource Strain: High Risk (05/15/2023)   Overall Financial Resource Strain (CARDIA)    Difficulty of Paying Living Expenses: Very hard  Food Insecurity: Food Insecurity Present (05/15/2023)   Hunger Vital Sign    Worried About Running Out of Food in the Last Year: Sometimes true    Ran Out of Food in the Last Year: Sometimes true  Transportation Needs: No Transportation Needs (05/15/2023)   PRAPARE - Administrator, Civil Service (Medical): No    Lack of Transportation (Non-Medical): No  Physical Activity: Insufficiently Active (05/15/2023)   Exercise Vital Sign    Days of Exercise per Week: 3  days    Minutes of Exercise per Session: 30 min  Stress: Stress Concern Present (02/12/2023)   [employer] of Occupational Health - Occupational Stress Questionnaire    Feeling of Stress : Very much  Social Connections: Moderately Isolated (05/15/2023)   Social Connection and Isolation Panel [NHANES]    Frequency of Communication with Friends and Family: Twice a week    Frequency of Social Gatherings with Friends and Family: Once a week    Attends Religious Services: More than 4 times per year    Active Member of [employer] or Organizations: No    Attends Banker Meetings: Never    Marital Status: Never married  Intimate Partner Violence: Not At Risk (05/15/2023)   Humiliation, Afraid, Rape, and Kick questionnaire    Fear of Current or Ex-Partner: No     Emotionally Abused: No    Physically Abused: No    Sexually Abused: No    Review of Systems  All other systems reviewed and are negative.       Objective    BP (!) 149/82   Pulse 69   Temp 98.2 F (36.8 C) (Oral)   Resp 16   Ht 5\' 5"  (1.651 m)   Wt 150 lb (68 kg)   SpO2 97%   BMI 24.96 kg/m   Physical Exam Vitals and nursing note reviewed.  Constitutional:      General: She is not in acute distress. Cardiovascular:     Rate and Rhythm: Normal rate and regular rhythm.  Pulmonary:     Effort: Pulmonary effort is normal.     Breath sounds: Normal breath sounds.  Abdominal:     Palpations: Abdomen is soft.     Tenderness: There is no abdominal tenderness.  Musculoskeletal:     Right lower leg: No edema.     Left lower leg: No edema.  Neurological:     General: No focal deficit present.     Mental Status: She is alert and oriented to person, place, and time.         Assessment & Plan:   Essential hypertension  Mixed hyperlipidemia -     Rosuvastatin Calcium; Take 1 tablet (20 mg total) by mouth daily.  Dispense: 90 tablet; Refill: 1  Gastroesophageal reflux disease, unspecified whether esophagitis present  Other orders -     Omeprazole; Take 1 capsule (20 mg total) by mouth daily.  Dispense: 90 capsule; Refill: 1 -     Carvedilol; Take 1 tablet (25 mg total) by mouth 2 (two) times daily with a meal.  Dispense: 180 tablet; Refill: 1 -     Chlorthalidone; Take 1 tablet (25 mg total) by mouth daily.  Dispense: 90 tablet; Refill: 1     Return in about 6 months (around 11/15/2023) for follow up, chronic med issues.   Tommie Raymond, MD

## 2023-05-16 NOTE — Addendum Note (Signed)
 Addended by: Alvester Chou on: 05/16/2023 01:41 PM   Modules accepted: Orders

## 2023-05-17 ENCOUNTER — Ambulatory Visit: Payer: 59

## 2023-05-17 NOTE — Progress Notes (Signed)
 Complex Care Management Note Care Guide Note  05/17/2023 Name: Wendy Jensen MRN: [id number] DOB: [birth year]   Complex Care Management Outreach Attempts: A second unsuccessful outreach was attempted today to offer the patient with information about available complex care management services.  Follow Up Plan:  Additional outreach attempts will be made to offer the patient complex care management information and services.   Encounter Outcome:  No Answer  [name]  [facility]  [facility] Guide  Direct Dial: [phone]  Fax [phone]

## 2023-05-18 NOTE — Progress Notes (Signed)
 Complex Care Management Note Care Guide Note  05/18/2023 Name: Wendy Jensen MRN: [id number] DOB: [birth year]   Complex Care Management Outreach Attempts: A third unsuccessful outreach was attempted today to offer the patient with information about available complex care management services.  Follow Up Plan:  No further outreach attempts will be made at this time. We have been unable to contact the patient to offer or enroll patient in complex care management services.  Encounter Outcome:  No Answer  [name]  [facility]  [facility] Guide  Direct Dial: [phone]  Fax [phone]

## 2023-05-30 ENCOUNTER — Ambulatory Visit

## 2023-06-11 ENCOUNTER — Ambulatory Visit

## 2023-06-19 ENCOUNTER — Ambulatory Visit
Admission: RE | Admit: 2023-06-19 | Discharge: 2023-06-19 | Disposition: A | Discharge status: Home / Self Care | Source: Ambulatory Visit | Attending: Family Medicine | Admitting: Family Medicine

## 2023-06-19 DIAGNOSIS — Z1231 Encounter for screening mammogram for malignant neoplasm of breast: Secondary | ICD-10-CM

## 2023-07-14 ENCOUNTER — Other Ambulatory Visit: Payer: Self-pay | Admitting: General Practice

## 2023-11-15 ENCOUNTER — Ambulatory Visit: Admitting: Family Medicine

## 2023-12-27 ENCOUNTER — Telehealth: Payer: Self-pay | Admitting: Family Medicine

## 2023-12-27 ENCOUNTER — Ambulatory Visit: Admitting: Family Medicine

## 2023-12-27 NOTE — Telephone Encounter (Signed)
 Called pt to reschedule missed appt; could not reach or leave vm

## 2024-01-31 ENCOUNTER — Other Ambulatory Visit: Payer: Self-pay | Admitting: Family Medicine

## 2024-01-31 NOTE — Telephone Encounter (Signed)
 Copied from CRM [id number]. Topic: Clinical - Medication Refill >> Jan 31, 2024  3:53 PM Fonda T wrote: Medication: carvedilol  (COREG ) 25 MG tablet  Has the patient contacted their pharmacy? Yes, advised to contact office   This is the patient's preferred pharmacy:  [facility] [address], [place] - [address] [place] [address] Phone: [phone] Fax: [phone]   Is this the correct pharmacy for this prescription? Yes If no, delete pharmacy and type the correct one.   Has the prescription been filled recently? Yes  Is the patient out of the medication? Yes  Has the patient been seen for an appointment in the last year OR does the patient have an upcoming appointment? Yes  Can we respond through MyChart? No, prefers phone call to [phone]  Agent: Please be advised that Rx refills may take up to 3 business days. We ask that you follow-up with your pharmacy.

## 2024-02-01 ENCOUNTER — Telehealth: Payer: Self-pay | Admitting: Cardiology

## 2024-02-01 NOTE — Telephone Encounter (Signed)
 Pt c/o medication issue:  1. Name of Medication:   chlorthalidone  (HYGROTON ) 25 MG tablet    2. How are you currently taking this medication (dosage and times per day)?    3. Are you having a reaction (difficulty breathing--STAT)? no  4. What is your medication issue? Patient calling to see if she can two 2 pills a day instead of one. Please advise

## 2024-02-01 NOTE — Telephone Encounter (Signed)
 Pt stated she was trying to get in touch with her PCP about her Coreg  2 times a day refilled. Wanted to know if she could take 2 chlorthalidone  instead. Told her those were two different medications and I would not recommend doing that. While she was on the phone we set her up with an Appointment in our office for December 10 for 1 year follow up. Advised pt to come 15 minutes early. Pt stated understanding.

## 2024-02-02 NOTE — Telephone Encounter (Signed)
 Requested medication (s) are due for refill today: yes  Requested medication (s) are on the active medication list: yes  Last refill:  05/15/23  Future visit scheduled: {Yes  Notes to clinic:  Unable to refill per protocol due to failed labs, no updated results.      Requested Prescriptions  Pending Prescriptions Disp Refills   carvedilol  (COREG ) 25 MG tablet 180 tablet 1    Sig: Take 1 tablet (25 mg total) by mouth 2 (two) times daily with a meal.     Cardiovascular: Beta Blockers 3 Failed - 02/02/2024 10:38 AM      Failed - Cr in normal range and within 360 days    Creatinine, Ser  Date Value Ref Range Status  12/28/2022 0.53 (L) 0.57 - 1.00 mg/dL Final         Failed - AST in normal range and within 360 days    AST  Date Value Ref Range Status  12/28/2022 34 0 - 40 IU/L Final         Failed - ALT in normal range and within 360 days    ALT  Date Value Ref Range Status  12/28/2022 23 0 - 32 IU/L Final         Failed - Last BP in normal range    BP Readings from Last 1 Encounters:  05/15/23 (!) 149/82         Failed - Valid encounter within last 6 months    Recent Outpatient Visits           8 months ago Essential hypertension   [facility], MD   11 months ago Essential hypertension   [facility], MD   1 year ago Ingrown toenail of both feet   [facility] [place] [facility] [place], NP   1 year ago Essential hypertension   [facility] [facility], MD   2 years ago Breast pain, left   [facility] [facility], MD       Future Appointments             In 2 weeks Dunn, Dayna N, PA-C [facility] [employer], [facility]            Passed - Last Heart Rate in normal range    Pulse Readings from Last 1 Encounters:  05/15/23 69

## 2024-02-04 MED ORDER — CARVEDILOL 25 MG PO TABS: 25.0000 mg | ORAL_TABLET | Freq: Two times a day (BID) | ORAL | 1 refills | Status: DC

## 2024-02-11 ENCOUNTER — Telehealth: Payer: Self-pay | Admitting: Family Medicine

## 2024-02-11 ENCOUNTER — Ambulatory Visit: Payer: Self-pay

## 2024-02-11 NOTE — Telephone Encounter (Signed)
 This encounter was created in error - please disregard.

## 2024-02-11 NOTE — Telephone Encounter (Signed)
 Copied from CRM [id number]. Topic: Clinical - Medication Refill >> Jan 31, 2024  3:53 PM Fonda T wrote: Medication: carvedilol  (COREG ) 25 MG tablet  Has the patient contacted their pharmacy? Yes, advised to contact office   This is the patient's preferred pharmacy:  [facility] [address], [place] - [address] [place] [address] Phone: [phone] Fax: [phone]   Is this the correct pharmacy for this prescription? Yes If no, delete pharmacy and type the correct one.   Has the prescription been filled recently? Yes  Is the patient out of the medication? Yes  Has the patient been seen for an appointment in the last year OR does the patient have an upcoming appointment? Yes  Can we respond through MyChart? No, prefers phone call to [phone]  Agent: Please be advised that Rx refills may take up to 3 business days. We ask that you follow-up with your pharmacy. >> Feb 11, 2024 10:24 AM Donna BRAVO wrote: -due to medicaion -took last pill on 01/30/24 -lightheadedness  due not taking medication   >> Feb 01, 2024  4:35 PM Hadassah PARAS wrote: Pt was calling to check in on status of medication

## 2024-02-11 NOTE — Telephone Encounter (Signed)
 FYI Only or Action Required?: FYI only for provider: ED advised.  Patient was last seen in primary care on 05/15/2023 by Tanda Bleacher, MD.  Called Nurse Triage reporting Hypertension.  Symptoms began yesterday.  Interventions attempted: Prescription medications: Chlorthalidone .  Symptoms are: unchanged.  Triage Disposition: Go to ED Now (Notify PCP)  Patient/caregiver understands and will follow disposition?: Yes  Copied from CRM [id number]. Topic: Clinical - Red Word Triage >> Feb 11, 2024 12:34 PM Wess RAMAN wrote: Red Word that prompted transfer to Nurse Triage: Patient had to leave her job due to elevated blood pressure. Feeling light headed. BP 180/170. Needs blood pressure medication refilled  Medication: carvedilol  (COREG ) 25 MG tablet Reason for Disposition  [1] Systolic BP >= 160 OR Diastolic >= 100 AND [2] cardiac (e.g., breathing difficulty, chest pain) or neurologic symptoms (e.g., new-onset blurred or double vision, unsteady gait)  Answer Assessment - Initial Assessment Questions Lightheadedness X 2 days. Is out of Carvedilol , refill request put in today by Manfred Leap 1. BLOOD PRESSURE: What is your blood pressure? Did you take at least two measurements 5 minutes apart?     180/70 2. ONSET: When did you take your blood pressure?     This morning, hours ago. 3. HOW: How did you take your blood pressure? (e.g., automatic home BP monitor, visiting nurse)     Automatic home BP 4. HISTORY: Do you have a history of high blood pressure?     Yes 5. MEDICINES: Are you taking any medicines for blood pressure? Have you missed any doses recently?     Clorithalidone  Protocols used: Blood Pressure - High-A-AH

## 2024-02-12 ENCOUNTER — Other Ambulatory Visit: Payer: Self-pay | Admitting: Family Medicine

## 2024-02-20 ENCOUNTER — Ambulatory Visit: Admitting: Physician Assistant

## 2024-02-27 ENCOUNTER — Ambulatory Visit: Payer: Self-pay

## 2024-02-27 ENCOUNTER — Other Ambulatory Visit: Payer: Self-pay | Admitting: Family Medicine

## 2024-02-27 NOTE — Telephone Encounter (Signed)
 Patient/caller refused triage.  Reason for refusal: upset with care and lack of follow up from office.   Copied from CRM [id number]. Topic: Clinical - Red Word Triage >> Feb 27, 2024  2:24 PM Antwanette L wrote: Red Word that prompted transfer to Nurse Triage: Pt has ran out of her carvedilol  (COREG ) 25 MG tablet and pt is experiencing lightheadedness and her anxiety is in the air >> Feb 27, 2024  2:34 PM Antwanette L wrote: Patient unable to remain on hold due to long wait time.She  is requesting a callback at [phone]     Reason for Disposition  [1] Angry or rude caller AND [2] doesn't respond to 5 minutes of triager counseling AND [3] sick adult (or caller)  Answer Assessment - Initial Assessment Questions 1. SITUATION:  Document reason for call.     Returned call regarding Coreg  refill and symptoms of lightheadedness and anxiety. Pt became extremely frustrated stating she has been calling for Coreg  refill for 1 month and has been out of medication. She states every time she calls staff states they will get request over to PCP then no medication is sent. Pt voiced frustration with care and states that it is ridiculous that she cannot get a refill of medication. Pt stated she is looking to transfer care back to [facility] at this time as she has an appt with PCP 03/26/2024 yet cannot get a refill, feels that we are playing jeopardy with her life. Pt states she missed an appt d/t transportation and now she feels wrote off. Pt states she does see a cardiologist so she is taking amlodipine  but states her BP has been elevated all these calls are making her BP and anxiety increased. Reassured her that her care and health is important to us . Discussed that she is her best advocate and that I would relay that refill is needed. Pt was previously seen 05/15/2023 and does have a scheduled appt, 03/16/2024. Please advise.  Protocols used: Difficult Call-A-AH

## 2024-02-27 NOTE — Telephone Encounter (Unsigned)
 Copied from CRM #[id number]. Topic: Clinical - Medication Refill >> Feb 27, 2024  2:18 PM Antwanette L wrote: Medication: carvedilol  (COREG ) 25 MG tablet  Has the patient contacted their pharmacy? Yes   This is the patient's preferred pharmacy:  [facility] [address], [place] - [address] AT [address] [place] [address] Phone: [phone] Fax: [phone]  Is this the correct pharmacy for this prescription? Yes   Has the prescription been filled recently? Yes. Last refill was on 02/04/24  Is the patient out of the medication? Yes  Has the patient been seen for an appointment in the last year OR does the patient have an upcoming appointment? Yes. Last ov with Dr. Tanda was on 05/15/23 and next appt is on 03/26/24  Can we respond through MyChart? No. Contact the patient by phone at [phone]  Agent: Please be advised that Rx refills may take up to 3 business days. We ask that you follow-up with your pharmacy.

## 2024-02-28 NOTE — Telephone Encounter (Signed)
 Carvedilol  was sent in on 02/05/24 for a 90 day supply. Tried to call pt to advise, no answer, unable to LVM

## 2024-03-01 NOTE — Telephone Encounter (Signed)
 Too soon for refill, LRF 02/04/24 FOR 90/1 RF.  Requested Prescriptions  Pending Prescriptions Disp Refills   carvedilol  (COREG ) 25 MG tablet 180 tablet 1    Sig: Take 1 tablet (25 mg total) by mouth 2 (two) times daily with a meal.     Cardiovascular: Beta Blockers 3 Failed - 03/01/2024  6:24 AM      Failed - Cr in normal range and within 360 days    Creatinine, Ser  Date Value Ref Range Status  12/28/2022 0.53 (L) 0.57 - 1.00 mg/dL Final         Failed - AST in normal range and within 360 days    AST  Date Value Ref Range Status  12/28/2022 34 0 - 40 IU/L Final         Failed - ALT in normal range and within 360 days    ALT  Date Value Ref Range Status  12/28/2022 23 0 - 32 IU/L Final         Failed - Last BP in normal range    BP Readings from Last 1 Encounters:  05/15/23 (!) 149/82         Failed - Valid encounter within last 6 months    Recent Outpatient Visits           9 months ago Essential hypertension   [facility] [facility], MD   1 year ago Essential hypertension   [place] [facility], MD   1 year ago Ingrown toenail of both feet   [facility] [facility] [place], NP   1 year ago Essential hypertension   [facility], MD   2 years ago Breast pain, left   [place] [facility], MD       Future Appointments             In 3 weeks West, Katlyn D, NP [facility] [employer]. [facility], [facility]            Passed - Last Heart Rate in normal range    Pulse Readings from Last 1 Encounters:  05/15/23 69

## 2024-03-10 ENCOUNTER — Emergency Department (HOSPITAL_COMMUNITY)

## 2024-03-10 ENCOUNTER — Encounter (HOSPITAL_COMMUNITY): Payer: Self-pay

## 2024-03-10 ENCOUNTER — Observation Stay (HOSPITAL_COMMUNITY)
Admission: EM | Admit: 2024-03-10 | Discharge: 2024-03-11 | Disposition: A | Discharge status: Home / Self Care | Attending: Emergency Medicine | Admitting: Emergency Medicine

## 2024-03-10 ENCOUNTER — Other Ambulatory Visit: Payer: Self-pay

## 2024-03-10 DIAGNOSIS — Z79899 Other long term (current) drug therapy: Secondary | ICD-10-CM | POA: Insufficient documentation

## 2024-03-10 DIAGNOSIS — E782 Mixed hyperlipidemia: Secondary | ICD-10-CM | POA: Insufficient documentation

## 2024-03-10 DIAGNOSIS — J441 Chronic obstructive pulmonary disease with (acute) exacerbation: Principal | ICD-10-CM | POA: Insufficient documentation

## 2024-03-10 DIAGNOSIS — J45909 Unspecified asthma, uncomplicated: Secondary | ICD-10-CM | POA: Insufficient documentation

## 2024-03-10 DIAGNOSIS — J9601 Acute respiratory failure with hypoxia: Secondary | ICD-10-CM | POA: Insufficient documentation

## 2024-03-10 DIAGNOSIS — I700 Atherosclerosis of aorta: Secondary | ICD-10-CM | POA: Insufficient documentation

## 2024-03-10 DIAGNOSIS — K219 Gastro-esophageal reflux disease without esophagitis: Secondary | ICD-10-CM | POA: Insufficient documentation

## 2024-03-10 DIAGNOSIS — I10 Essential (primary) hypertension: Secondary | ICD-10-CM | POA: Insufficient documentation

## 2024-03-10 DIAGNOSIS — Z87891 Personal history of nicotine dependence: Secondary | ICD-10-CM | POA: Insufficient documentation

## 2024-03-10 DIAGNOSIS — F419 Anxiety disorder, unspecified: Secondary | ICD-10-CM | POA: Insufficient documentation

## 2024-03-10 LAB — CBC WITH DIFFERENTIAL/PLATELET
Abs Immature Granulocytes: 0.04 K/uL (ref 0.00–0.07)
Basophils Absolute: 0.1 K/uL (ref 0.0–0.1)
Basophils Relative: 1 %
Eosinophils Absolute: 0.5 K/uL (ref 0.0–0.5)
Eosinophils Relative: 4 %
HCT: 48.2 % — ABNORMAL HIGH (ref 36.0–46.0)
Hemoglobin: 15.6 g/dL — ABNORMAL HIGH (ref 12.0–15.0)
Immature Granulocytes: 0 %
Lymphocytes Relative: 32 %
Lymphs Abs: 3.5 K/uL (ref 0.7–4.0)
MCH: 31.2 pg (ref 26.0–34.0)
MCHC: 32.4 g/dL (ref 30.0–36.0)
MCV: 96.4 fL (ref 80.0–100.0)
Monocytes Absolute: 0.6 K/uL (ref 0.1–1.0)
Monocytes Relative: 5 %
Neutro Abs: 6.4 K/uL (ref 1.7–7.7)
Neutrophils Relative %: 58 %
Platelets: 370 K/uL (ref 150–400)
RBC: 5 MIL/uL (ref 3.87–5.11)
RDW: 12.1 % (ref 11.5–15.5)
WBC: 11.1 K/uL — ABNORMAL HIGH (ref 4.0–10.5)
nRBC: 0 % (ref 0.0–0.2)

## 2024-03-10 LAB — COMPREHENSIVE METABOLIC PANEL WITH GFR
ALT: 12 U/L (ref 0–44)
AST: 27 U/L (ref 15–41)
Albumin: 4.6 g/dL (ref 3.5–5.0)
Alkaline Phosphatase: 116 U/L (ref 38–126)
Anion gap: 15 (ref 5–15)
BUN: 13 mg/dL (ref 8–23)
CO2: 24 mmol/L (ref 22–32)
Calcium: 9.7 mg/dL (ref 8.9–10.3)
Chloride: 104 mmol/L (ref 98–111)
Creatinine, Ser: 0.72 mg/dL (ref 0.44–1.00)
GFR, Estimated: >60 mL/min
Glucose, Bld: 151 mg/dL — ABNORMAL HIGH (ref 70–99)
Potassium: 3.9 mmol/L (ref 3.5–5.1)
Sodium: 143 mmol/L (ref 135–145)
Total Bilirubin: 0.9 mg/dL (ref 0.0–1.2)
Total Protein: 8.5 g/dL — ABNORMAL HIGH (ref 6.5–8.1)

## 2024-03-10 MED ORDER — CHLORTHALIDONE 25 MG PO TABS
25.0000 mg | ORAL_TABLET | Freq: Every day | ORAL | Status: DC
  Filled 2024-03-10: qty 1

## 2024-03-10 MED ORDER — IPRATROPIUM-ALBUTEROL 0.5-2.5 (3) MG/3ML IN SOLN
3.0000 mL | Freq: Once | RESPIRATORY_TRACT | Status: AC
  Administered 2024-03-10: 3 mL via RESPIRATORY_TRACT
  Filled 2024-03-10: qty 3

## 2024-03-10 MED ORDER — IPRATROPIUM-ALBUTEROL 0.5-2.5 (3) MG/3ML IN SOLN
RESPIRATORY_TRACT | Status: AC
  Administered 2024-03-10: 3 mL via RESPIRATORY_TRACT
  Filled 2024-03-10: qty 9

## 2024-03-10 MED ORDER — PREDNISONE 20 MG PO TABS
60.0000 mg | ORAL_TABLET | Freq: Once | ORAL | Status: AC
  Administered 2024-03-10: 60 mg via ORAL
  Filled 2024-03-10: qty 3

## 2024-03-10 MED ORDER — ACETAMINOPHEN 650 MG RE SUPP: 650.0000 mg | Freq: Four times a day (QID) | RECTAL | Status: DC | PRN

## 2024-03-10 MED ORDER — MAGNESIUM SULFATE 2 GM/50ML IV SOLN
INTRAVENOUS | Status: AC
  Administered 2024-03-10: 2 g
  Filled 2024-03-10: qty 50

## 2024-03-10 MED ORDER — CARVEDILOL 12.5 MG PO TABS
25.0000 mg | ORAL_TABLET | Freq: Two times a day (BID) | ORAL | Status: DC
  Administered 2024-03-10 – 2024-03-11 (×2): 25 mg via ORAL
  Filled 2024-03-10 (×2): qty 2

## 2024-03-10 MED ORDER — ONDANSETRON HCL 4 MG/2ML IJ SOLN: 4.0000 mg | Freq: Four times a day (QID) | INTRAMUSCULAR | Status: DC | PRN

## 2024-03-10 MED ORDER — ONDANSETRON HCL 4 MG PO TABS: 4.0000 mg | ORAL_TABLET | Freq: Four times a day (QID) | ORAL | Status: DC | PRN

## 2024-03-10 MED ORDER — ENOXAPARIN SODIUM 40 MG/0.4ML IJ SOSY
40.0000 mg | PREFILLED_SYRINGE | Freq: Every day | INTRAMUSCULAR | Status: DC
  Administered 2024-03-11: 40 mg via SUBCUTANEOUS
  Filled 2024-03-10 (×2): qty 0.4

## 2024-03-10 MED ORDER — PREDNISONE 20 MG PO TABS
40.0000 mg | ORAL_TABLET | Freq: Every day | ORAL | Status: DC
  Administered 2024-03-11: 40 mg via ORAL
  Filled 2024-03-10: qty 2

## 2024-03-10 MED ORDER — PANTOPRAZOLE SODIUM 40 MG PO TBEC: 40.0000 mg | DELAYED_RELEASE_TABLET | Freq: Every day | ORAL | Status: DC

## 2024-03-10 MED ORDER — ACETAMINOPHEN 325 MG PO TABS: 650.0000 mg | ORAL_TABLET | Freq: Four times a day (QID) | ORAL | Status: DC | PRN

## 2024-03-10 MED ORDER — IPRATROPIUM-ALBUTEROL 0.5-2.5 (3) MG/3ML IN SOLN
3.0000 mL | Freq: Four times a day (QID) | RESPIRATORY_TRACT | Status: DC
  Administered 2024-03-10 – 2024-03-11 (×4): 3 mL via RESPIRATORY_TRACT
  Filled 2024-03-10 (×4): qty 3

## 2024-03-10 MED ORDER — ROSUVASTATIN CALCIUM 20 MG PO TABS: 20.0000 mg | ORAL_TABLET | Freq: Every day | ORAL | Status: DC

## 2024-03-10 MED ORDER — ALBUTEROL SULFATE (2.5 MG/3ML) 0.083% IN NEBU
2.5000 mg | INHALATION_SOLUTION | RESPIRATORY_TRACT | Status: DC | PRN
  Administered 2024-03-10 – 2024-03-11 (×2): 2.5 mg via RESPIRATORY_TRACT
  Filled 2024-03-10 (×3): qty 3

## 2024-03-10 MED ORDER — ALBUTEROL SULFATE (2.5 MG/3ML) 0.083% IN NEBU
15.0000 mg/h | INHALATION_SOLUTION | Freq: Once | RESPIRATORY_TRACT | Status: AC
  Administered 2024-03-10: 15 mg/h via RESPIRATORY_TRACT

## 2024-03-10 NOTE — ED Triage Notes (Signed)
 Arrives [facility] from home. Paramedics report this is their second visit from the residence for asthma and wheezing. Initially they assisted with inhaler and symptoms improved   Upon return this morning pt found wheezing in upper lobes and administered a Duoneb.

## 2024-03-10 NOTE — ED Provider Notes (Cosign Needed Addendum)
 " [place] EMERGENCY DEPARTMENT AT [facility] Provider Note   CSN: [id number] Arrival date & time: 03/10/24  9364     Patient presents with: Shortness of Breath   Wendy Jensen is a 63 y.o. female.   Patient to ED c/o SOB and cough without fever. No body aches, sore throat. History of COPD, continuous smoker, bronchitis and previous admissions for same. No vomiting. Denies chest pain.   The history is provided by the patient. No language interpreter was used.  Shortness of Breath      Prior to Admission medications  Medication Sig Start Date End Date Taking? Authorizing Provider  carvedilol  (COREG ) 25 MG tablet Take 1 tablet (25 mg total) by mouth 2 (two) times daily with a meal. 02/04/24   Tanda Bleacher, MD  chlorthalidone  (HYGROTON ) 25 MG tablet Take 1 tablet (25 mg total) by mouth daily. Please call [phone] to schedule an October appointment for future refills. Thank you. 07/17/23   Kate Lonni CROME, MD  omeprazole  (PRILOSEC) 20 MG capsule TAKE 1 CAPSULE(20 MG) BY MOUTH DAILY 02/01/24   Tanda Bleacher, MD  rosuvastatin  (CRESTOR ) 20 MG tablet Take 1 tablet (20 mg total) by mouth daily. 05/15/23   Tanda Bleacher, MD    Allergies: Amlodipine  benzoate, Amlodipine  besylate, Hydralazine , Hydrochlorothiazide , Lisinopril, Losartan, Metoprolol tartrate, and Spironolactone     Review of Systems  Respiratory:  Positive for shortness of breath.     Updated Vital Signs BP 136/83   Pulse 84   Temp 97.9 F (36.6 C) (Oral)   Resp 18   Ht 5' 5 (1.651 m)   Wt 68 kg   SpO2 95%   BMI 24.96 kg/m   Physical Exam Vitals and nursing note reviewed.  Constitutional:      Appearance: She is well-developed. She is not diaphoretic.  HENT:     Mouth/Throat:     Mouth: Mucous membranes are moist.  Cardiovascular:     Rate and Rhythm: Normal rate and regular rhythm.  Pulmonary:     Effort: Tachypnea present. No respiratory distress.     Breath sounds:  Wheezing present.     Comments: Prolonged expirations, air movement diminished.  Skin:    General: Skin is warm and dry.  Neurological:     Mental Status: She is alert and oriented to person, place, and time.     (all labs ordered are listed, but only abnormal results are displayed) Labs Reviewed  CBC WITH DIFFERENTIAL/PLATELET - Abnormal; Notable for the following components:      Result Value   WBC 11.1 (*)    Hemoglobin 15.6 (*)    HCT 48.2 (*)    All other components within normal limits  COMPREHENSIVE METABOLIC PANEL WITH GFR - Abnormal; Notable for the following components:   Glucose, Bld 151 (*)    Total Protein 8.5 (*)    All other components within normal limits    EKG: EKG Interpretation Date/Time:  Monday March 10 2024 06:48:37 EST Ventricular Rate:  67 PR Interval:  193 QRS Duration:  80 QT Interval:  417 QTC Calculation: 441 R Axis:   61  Text Interpretation: Sinus rhythm No significant change since last tracing Confirmed by Emil Share [phone]) on 03/10/2024 6:51:12 AM  Radiology: DG Chest 2 View Result Date: 03/10/2024 EXAM: 2 VIEW(S) XRAY OF THE CHEST 03/10/2024 07:01:36 AM COMPARISON: 05/12/2023 CLINICAL HISTORY: SOB FINDINGS: LUNGS AND PLEURA: No focal pulmonary opacity. No pleural effusion. No pneumothorax. HEART AND MEDIASTINUM: No acute abnormality of  the cardiac and mediastinal silhouettes. Atherosclerotic calcifications. BONES AND SOFT TISSUES: Multilevel degenerative changes of thoracic spine. IMPRESSION: 1. No acute process. Electronically signed by: Waddell Calk MD 03/10/2024 07:11 AM EST RP Workstation: [id number]     Procedures   Medications Ordered in the ED  ipratropium-albuterol  (DUONEB) 0.5-2.5 (3) MG/3ML nebulizer solution 3 mL (3 mLs Nebulization Given 03/10/24 1157)  predniSONE  (DELTASONE ) tablet 60 mg (60 mg Oral Given 03/10/24 1100)  magnesium  sulfate 2 GM/50ML IVPB (2 g  New Bag/Given 03/10/24 1213)  albuterol  (PROVENTIL ) (2.5  MG/3ML) 0.083% nebulizer solution (15 mg/hr Nebulization Given 03/10/24 1214)    Clinical Course as of 03/10/24 1402  Mon Mar 10, 2024  1218 The patient was initially seen by me in triage on re-evaluation after prolonged lobby wait due to census. On my exam, she is wheezing, tachypneic but not distressed. O2 saturations 91-92%. No chest pain. Nebulizer ordered and provided in triage. After neb, she continues to have significant wheezing and additional orders are placed. During triage care, the patient became suddenly more SOB, diaphoretic and was moved into a room. She was started on a nebulizer, IV magnesium  ordered. BiPap considered but while being seen by ED attending, Dr. Doretha, she improves and breathing becomes easier. No further diaphoresis. Still denies CP. Will monitor. Anticipate admission.  [SU]  1400 Patient completed a one-hour neb. Feels improved, still has wheezing but better air movement. After 10 minutes post-treatment her oxygen level dropped while sitting to 90%, SOB. No ambulating pulse ox required. Hospitalist called for admission.  [SU]    Clinical Course User Index [SU] Odell Balls, PA-C                                 Medical Decision Making Amount and/or Complexity of Data Reviewed Labs: ordered. Radiology: ordered.  Risk Prescription drug management.        Final diagnoses:  COPD exacerbation [facility])    ED Discharge Orders     None          Odell Balls, PA-C 03/10/24 1221    Odell Balls, PA-C 03/10/24 1402  "

## 2024-03-10 NOTE — ED Notes (Signed)
 Pt became sob, diaphoretic, unable to complete sentences, and dusky. Pt immediately moved to TR9 and placed on 5L [place], O2 sat was 82% on RA, additional duoneb started at this time. Dr. Doretha notified and in the room. Verbal order for BIPAP. RT called

## 2024-03-10 NOTE — H&P (Signed)
 " History and Physical    Patient: Wendy Jensen FMW:[id number] DOB: 10/08/1960 DOA: 03/10/2024 DOS: the patient was seen and examined on 03/10/2024 PCP: Tanda Bleacher, MD  Patient coming from: Home  Chief Complaint:  Chief Complaint  Patient presents with   Shortness of Breath   HPI: Wendy Jensen is a 63 y.o. female with medical history significant of alcohol abuse, allergic rhinitis, anxiety, GERD, dysphagia, esophageal spasm, nicotine dependence, mixed hyperlipidemia, aortic calcification, osteoarthritis, unspecified obesity who presented to the emergency department with complaints of dyspnea associated with cough and wheezing since yesterday area she denied fever, chills, rhinorrhea, sore throat or hemoptysis.  No chest pain, palpitations, diaphoresis, PND, orthopnea or pitting edema of the lower extremities.  No abdominal pain, nausea, emesis, diarrhea, constipation, melena or hematochezia.  No flank pain, dysuria, frequency or hematuria.  No polyuria, polydipsia, polyphagia or blurred vision.   ED course: Initial vital signs were temperature 97.5 F, pulse 68, respiration 18, BP 142/87 mmHg and O2 sat 99% on room air.  The patient desaturated while in the emergency department to the 80s and is currently on 5 L/min via nasal cannula.  She received continuous albuterol  15 mg neb, DuoNeb and 60 mg of prednisone .  Lab work: CBC showed a white count 11.1, hemoglobin 15.6 g/dL and platelets 629.  CMP showed a glucose of 151 mg/deciliter and total protein 8.5 g/dL, the rest of the CMP measurements were normal.  Imaging: 2 view chest radiograph with no acute process.   Review of Systems: As mentioned in the history of present illness. All other systems reviewed and are negative. Past Medical History:  Diagnosis Date   GERD (gastroesophageal reflux disease)    Hypertension    Peptic ulcer    Reflux    Past Surgical History:  Procedure Laterality Date   COLONOSCOPY      ESOPHAGEAL DILATION     TUBAL LIGATION     UPPER GI ENDOSCOPY     Social History:  reports that she quit smoking about 5 years ago. Her smoking use included cigarettes. She smoked an average of 0.2 packs per day. She has never used smokeless tobacco. She reports current alcohol use. She reports that she does not use drugs.  Allergies[1]  Family History  Problem Relation Age of Onset   Hypertension Father     Prior to Admission medications  Medication Sig Start Date End Date Taking? Authorizing Provider  carvedilol  (COREG ) 25 MG tablet Take 1 tablet (25 mg total) by mouth 2 (two) times daily with a meal. 02/04/24   Tanda Bleacher, MD  chlorthalidone  (HYGROTON ) 25 MG tablet Take 1 tablet (25 mg total) by mouth daily. Please call [phone] to schedule an October appointment for future refills. Thank you. 07/17/23   Kate Lonni CROME, MD  omeprazole  (PRILOSEC) 20 MG capsule TAKE 1 CAPSULE(20 MG) BY MOUTH DAILY 02/01/24   Tanda Bleacher, MD  rosuvastatin  (CRESTOR ) 20 MG tablet Take 1 tablet (20 mg total) by mouth daily. 05/15/23   Tanda Bleacher, MD    Physical Exam: Vitals:   03/10/24 1315 03/10/24 1317 03/10/24 1319 03/10/24 1353  BP:      Pulse:      Resp:      Temp:      TempSrc:      SpO2: 97% (!) 87% 93% 95%  Weight:      Height:       Physical Exam Vitals and nursing note reviewed.  Constitutional:      General:  She is awake. She is not in acute distress.    Appearance: She is well-developed. She is ill-appearing.  HENT:     Head: Normocephalic.     Nose: No rhinorrhea.     Mouth/Throat:     Mouth: Mucous membranes are moist.  Eyes:     General: No scleral icterus.    Pupils: Pupils are equal, round, and reactive to light.  Neck:     Vascular: No JVD.  Cardiovascular:     Rate and Rhythm: Normal rate and regular rhythm.     Heart sounds: S1 normal and S2 normal.  Pulmonary:     Effort: No tachypnea or accessory muscle usage.     Breath sounds: Decreased  breath sounds, wheezing, rhonchi and rales present.  Abdominal:     General: Bowel sounds are normal. There is no distension.     Palpations: Abdomen is soft.     Tenderness: There is no abdominal tenderness. There is no right CVA tenderness or left CVA tenderness.  Musculoskeletal:     Cervical back: Neck supple.     Right lower leg: No edema.     Left lower leg: No edema.  Skin:    General: Skin is warm and dry.  Neurological:     General: No focal deficit present.     Mental Status: She is alert and oriented to person, place, and time.  Psychiatric:        Mood and Affect: Mood normal.        Behavior: Behavior normal. Behavior is cooperative.     Data Reviewed:  Results are pending, will review when available.  10/26/2021 transthoracic echocardiogram report.  IMPRESSIONS:   1. Left ventricular ejection fraction, by estimation, is 55%. The left  ventricle has normal function. The left ventricle has no regional wall  motion abnormalities. Mild focal basal septal left ventricular  hypertrophy. Left ventricular diastolic  parameters are consistent with Grade I diastolic dysfunction (impaired  relaxation).   2. Right ventricular systolic function is normal. The right ventricular  size is normal. There is normal pulmonary artery systolic pressure. The  estimated right ventricular systolic pressure is 20.6 mmHg.   3. The mitral valve is normal in structure. Trivial mitral valve  regurgitation. No evidence of mitral stenosis.   4. The aortic valve is tricuspid. Aortic valve regurgitation is not  visualized. No aortic stenosis is present.   5. The inferior vena cava is normal in size with greater than 50%  respiratory variability, suggesting right atrial pressure of 3 mmHg.    EKG: Vent. rate 67 BPM PR interval 193 ms QRS duration 80 ms QT/QTcB 417/441 ms P-R-T axes 74 61 71 Sinus rhythm  Assessment and Plan: Principal Problem:   Acute respiratory failure with  hypoxia (HCC) In the setting of:   Reactive airway disease Observation/telemetry Continue supplemental oxygen. Prednisone  60 mg p.o. x1. Followed by prednisone  40 mg p.o. daily in a.m. Scheduled and as needed bronchodilators. Follow-up CBC and chemistry in the morning.   Active Problems:   Essential hypertension Continue carvedilol  25 mg p.o. twice daily. Continue chlorthalidone  25 mg p.o. daily.    GERD Antiacid, H2 blocker or PPI as needed.    Mixed hyperlipidemia   Aortic calcification Would benefit from statin therapy. Follow-up with PCP for treatment options.    Advance Care Planning:   Code Status: Full Code   Consults:   Family Communication:   Severity of Illness: The appropriate patient status for this  patient is OBSERVATION. Observation status is judged to be reasonable and necessary in order to provide the required intensity of service to ensure the patient's safety. The patient's presenting symptoms, physical exam findings, and initial radiographic and laboratory data in the context of their medical condition is felt to place them at decreased risk for further clinical deterioration. Furthermore, it is anticipated that the patient will be medically stable for discharge from the hospital within 2 midnights of admission.   Author: Alm Dorn Castor, MD 03/10/2024 2:14 PM  For on call review [email or web address].   This document was prepared using Dragon voice recognition software and may contain some unintended transcription errors.     [1]  Allergies Allergen Reactions   Amlodipine  Benzoate Other (See Comments)    Patient reports this made her feel sick.   Amlodipine  Besylate     Patient reports this makes her feel sick.   Hydralazine  Other (See Comments)   Hydrochlorothiazide  Other (See Comments)   Lisinopril Other (See Comments)    Patient reports this made her feel sick.   Losartan Other (See Comments)   Metoprolol Tartrate     Patient reports this  makes her feel sick.   Spironolactone      Nausea    "

## 2024-03-10 NOTE — ED Provider Triage Note (Signed)
 Emergency Medicine Provider Triage Evaluation Note  Wendy Jensen , a 63 y.o. female  was evaluated in triage.  Pt complains of SOB. Patient with h/o COPD, continuous smoker, here with SOB and cough. No fever. No chest pain. Feels like bronchitis to her. She has an inhaler at home, no nebulizer, and reports increased use with less efficacy.  Review of Systems  Positive: Negative:   Physical Exam  BP (!) 158/95 (BP Location: Right Arm)   Pulse 64   Temp 97.6 F (36.4 C) (Oral)   Resp (!) 22   Ht 5' 5 (1.651 m)   Wt 68 kg   SpO2 92%   BMI 24.96 kg/m  Gen:   Awake, no distress   Resp:  Normal effort  MSK:   Moves extremities without difficulty  Other:    Medical Decision Making  Medically screening exam initiated at 10:51 AM.  Appropriate orders placed.  [place] Klutz was informed that the remainder of the evaluation will be completed by another provider, this initial triage assessment does not replace that evaluation, and the importance of remaining in the ED until their evaluation is complete.     Odell Balls, PA-C 03/10/24 1053

## 2024-03-10 NOTE — ED Notes (Addendum)
 Pt received breathing tx, moved to hallway to monitor O2 sat and RR after breathing tx. Respirations equal and unlabored at this time, O2 sat 100% onRA

## 2024-03-11 ENCOUNTER — Other Ambulatory Visit (HOSPITAL_COMMUNITY): Payer: Self-pay

## 2024-03-11 DIAGNOSIS — E782 Mixed hyperlipidemia: Secondary | ICD-10-CM | POA: Diagnosis not present

## 2024-03-11 DIAGNOSIS — I10 Essential (primary) hypertension: Secondary | ICD-10-CM | POA: Diagnosis not present

## 2024-03-11 DIAGNOSIS — J4520 Mild intermittent asthma, uncomplicated: Secondary | ICD-10-CM | POA: Diagnosis not present

## 2024-03-11 DIAGNOSIS — K219 Gastro-esophageal reflux disease without esophagitis: Secondary | ICD-10-CM

## 2024-03-11 DIAGNOSIS — J9601 Acute respiratory failure with hypoxia: Secondary | ICD-10-CM | POA: Diagnosis not present

## 2024-03-11 LAB — COMPREHENSIVE METABOLIC PANEL WITH GFR
ALT: 12 U/L (ref 0–44)
AST: 32 U/L (ref 15–41)
Albumin: 4.2 g/dL (ref 3.5–5.0)
Alkaline Phosphatase: 96 U/L (ref 38–126)
Anion gap: 11 (ref 5–15)
BUN: 15 mg/dL (ref 8–23)
CO2: 26 mmol/L (ref 22–32)
Calcium: 9.7 mg/dL (ref 8.9–10.3)
Chloride: 104 mmol/L (ref 98–111)
Creatinine, Ser: 0.69 mg/dL (ref 0.44–1.00)
GFR, Estimated: >60 mL/min
Glucose, Bld: 122 mg/dL — ABNORMAL HIGH (ref 70–99)
Potassium: 4.2 mmol/L (ref 3.5–5.1)
Sodium: 140 mmol/L (ref 135–145)
Total Bilirubin: 0.6 mg/dL (ref 0.0–1.2)
Total Protein: 7.7 g/dL (ref 6.5–8.1)

## 2024-03-11 LAB — CBC
HCT: 42.7 % (ref 36.0–46.0)
Hemoglobin: 13.9 g/dL (ref 12.0–15.0)
MCH: 31 pg (ref 26.0–34.0)
MCHC: 32.6 g/dL (ref 30.0–36.0)
MCV: 95.3 fL (ref 80.0–100.0)
Platelets: 325 K/uL (ref 150–400)
RBC: 4.48 MIL/uL (ref 3.87–5.11)
RDW: 12.1 % (ref 11.5–15.5)
WBC: 9.7 K/uL (ref 4.0–10.5)
nRBC: 0 % (ref 0.0–0.2)

## 2024-03-11 LAB — HIV ANTIBODY (ROUTINE TESTING W REFLEX): HIV Screen 4th Generation wRfx: NONREACTIVE

## 2024-03-11 MED ORDER — ALBUTEROL SULFATE HFA 108 (90 BASE) MCG/ACT IN AERS
1.0000 | INHALATION_SPRAY | RESPIRATORY_TRACT | Status: DC | PRN
  Administered 2024-03-11: 1 via RESPIRATORY_TRACT
  Filled 2024-03-11: qty 6.7

## 2024-03-11 MED ORDER — ALBUTEROL SULFATE HFA 108 (90 BASE) MCG/ACT IN AERS
2.0000 | INHALATION_SPRAY | Freq: Four times a day (QID) | RESPIRATORY_TRACT | 2 refills | Status: DC | PRN
  Filled 2024-03-11: qty 6.7, 30d supply, fill #0

## 2024-03-11 MED ORDER — FOLIC ACID 1 MG PO TABS
1.0000 mg | ORAL_TABLET | Freq: Every day | ORAL | 1 refills | Status: DC
  Filled 2024-03-11: qty 30, 30d supply, fill #0

## 2024-03-11 MED ORDER — ADULT MULTIVITAMIN W/MINERALS CH
1.0000 | ORAL_TABLET | Freq: Every day | ORAL | Status: DC
  Administered 2024-03-11: 1 via ORAL
  Filled 2024-03-11: qty 1

## 2024-03-11 MED ORDER — ADULT MULTIVITAMIN W/MINERALS CH: 1.0000 | ORAL_TABLET | Freq: Every day | ORAL | 1 refills | Status: DC

## 2024-03-11 MED ORDER — CLONAZEPAM 0.5 MG PO TABS
0.5000 mg | ORAL_TABLET | Freq: Two times a day (BID) | ORAL | 0 refills | Status: DC | PRN
  Filled 2024-03-11: qty 15, 8d supply, fill #0

## 2024-03-11 MED ORDER — FOLIC ACID 1 MG PO TABS: 1.0000 mg | ORAL_TABLET | Freq: Every day | ORAL | 1 refills | Status: DC

## 2024-03-11 MED ORDER — ALBUTEROL SULFATE HFA 108 (90 BASE) MCG/ACT IN AERS: 2.0000 | INHALATION_SPRAY | Freq: Four times a day (QID) | RESPIRATORY_TRACT | 2 refills | Status: DC | PRN

## 2024-03-11 MED ORDER — PREDNISONE 20 MG PO TABS: ORAL_TABLET | ORAL | 0 refills | Status: DC

## 2024-03-11 MED ORDER — THIAMINE HCL 100 MG/ML IJ SOLN: 100.0000 mg | Freq: Every day | INTRAMUSCULAR | Status: DC

## 2024-03-11 MED ORDER — VITAMIN B-1 100 MG PO TABS
100.0000 mg | ORAL_TABLET | Freq: Every day | ORAL | 1 refills | Status: DC
  Filled 2024-03-11: qty 30, 30d supply, fill #0

## 2024-03-11 MED ORDER — VITAMIN B-1 100 MG PO TABS: 100.0000 mg | ORAL_TABLET | Freq: Every day | ORAL | 1 refills | Status: DC

## 2024-03-11 MED ORDER — FOLIC ACID 1 MG PO TABS
1.0000 mg | ORAL_TABLET | Freq: Every day | ORAL | Status: DC
  Administered 2024-03-11: 1 mg via ORAL
  Filled 2024-03-11: qty 1

## 2024-03-11 MED ORDER — THIAMINE MONONITRATE 100 MG PO TABS
100.0000 mg | ORAL_TABLET | Freq: Every day | ORAL | Status: DC
  Administered 2024-03-11: 100 mg via ORAL
  Filled 2024-03-11: qty 1

## 2024-03-11 MED ORDER — GUAIFENESIN ER 600 MG PO TB12
1200.0000 mg | ORAL_TABLET | Freq: Two times a day (BID) | ORAL | 0 refills | Status: DC
  Filled 2024-03-11: qty 40, 10d supply, fill #0

## 2024-03-11 MED ORDER — PREDNISONE 20 MG PO TABS
ORAL_TABLET | ORAL | 0 refills | Status: DC
  Filled 2024-03-11: qty 9, 6d supply, fill #0

## 2024-03-11 MED ORDER — ALBUTEROL SULFATE (2.5 MG/3ML) 0.083% IN NEBU: 3.0000 mL | INHALATION_SOLUTION | Freq: Four times a day (QID) | RESPIRATORY_TRACT | Status: DC | PRN

## 2024-03-11 MED ORDER — PANTOPRAZOLE SODIUM 40 MG PO TBEC
40.0000 mg | DELAYED_RELEASE_TABLET | Freq: Every day | ORAL | Status: DC
  Administered 2024-03-11: 40 mg via ORAL
  Filled 2024-03-11: qty 1

## 2024-03-11 MED ORDER — GUAIFENESIN ER 600 MG PO TB12: 1200.0000 mg | ORAL_TABLET | Freq: Two times a day (BID) | ORAL | Status: DC

## 2024-03-11 MED ORDER — GUAIFENESIN ER 600 MG PO TB12
1200.0000 mg | ORAL_TABLET | Freq: Two times a day (BID) | ORAL | Status: DC
  Administered 2024-03-11: 1200 mg via ORAL
  Filled 2024-03-11: qty 2

## 2024-03-11 MED ORDER — CLONAZEPAM 0.5 MG PO TABS
0.5000 mg | ORAL_TABLET | Freq: Two times a day (BID) | ORAL | Status: DC | PRN
  Administered 2024-03-11: 0.5 mg via ORAL
  Filled 2024-03-11: qty 1

## 2024-03-11 MED ORDER — LORAZEPAM 1 MG PO TABS: 1.0000 mg | ORAL_TABLET | ORAL | Status: DC | PRN

## 2024-03-11 MED ORDER — LORAZEPAM 2 MG/ML IJ SOLN
1.0000 mg | INTRAMUSCULAR | Status: DC | PRN
  Administered 2024-03-11: 2 mg via INTRAVENOUS
  Filled 2024-03-11: qty 1

## 2024-03-11 MED ORDER — CLONAZEPAM 0.5 MG PO TABS: 0.5000 mg | ORAL_TABLET | Freq: Two times a day (BID) | ORAL | 0 refills | Status: DC | PRN

## 2024-03-11 NOTE — Discharge Summary (Signed)
 " Physician Discharge Summary   Patient: Wendy Jensen MRN: [id number] DOB: 1960-06-26  Admit date:     03/10/2024  Discharge date: {dischdate:26783}  Discharge Physician: Concepcion Riser   PCP: Tanda Bleacher, MD   Recommendations at discharge:  {Tip this will not be part of the note when signed- Example include specific recommendations for outpatient follow-up, pending tests to follow-up on. (Optional):26781}  PCP follow up in 1 week.  Discharge Diagnoses: Principal Problem:   Acute respiratory failure with hypoxia (HCC) Active Problems:   Essential hypertension   GERD   Mixed hyperlipidemia   Aortic calcification   Reactive airway disease  Resolved Problems:   * No resolved hospital problems. [facility] Course: No notes on file  Assessment and Plan: No notes have been filed under this hospital service. Service: Hospitalist     {Tip this will not be part of the note when signed Body mass index is 24.96 kg/m. , ,  (Optional):26781}  {(NOTE) Pain control PDMP Statment (Optional):26782} Consultants: none Procedures performed: none  Disposition: Home Diet recommendation:  Discharge Diet Orders (From admission, onward)     Start     Ordered   03/11/24 0000  Diet general        03/11/24 1733           Cardiac diet DISCHARGE MEDICATION: Allergies as of 03/11/2024       Reactions   Amlodipine  Benzoate Other (See Comments)   Patient reports this made her feel sick.   Amlodipine  Besylate    Patient reports this makes her feel sick.   Hydralazine  Other (See Comments)   Hydrochlorothiazide  Other (See Comments)   Lisinopril Other (See Comments)   Patient reports this made her feel sick.   Losartan Other (See Comments)   Metoprolol Tartrate    Patient reports this makes her feel sick.   Spironolactone     Nausea         Medication List     TAKE these medications    albuterol  108 (90 Base) MCG/ACT inhaler Commonly known as: VENTOLIN   HFA Inhale 2 puffs into the lungs every 6 (six) hours as needed for wheezing or shortness of breath.   carvedilol  25 MG tablet Commonly known as: COREG  Take 1 tablet (25 mg total) by mouth 2 (two) times daily with a meal.   chlorthalidone  25 MG tablet Commonly known as: HYGROTON  Take 1 tablet (25 mg total) by mouth daily. Please call [phone] to schedule an October appointment for future refills. Thank you.   clonazePAM  0.5 MG tablet Commonly known as: KLONOPIN  Take 1 tablet (0.5 mg total) by mouth 2 (two) times daily as needed (anxiety).   folic acid  1 MG tablet Commonly known as: FOLVITE  Take 1 tablet (1 mg total) by mouth daily. Start taking on: March 12, 2024   multivitamin with minerals Tabs tablet Take 1 tablet by mouth daily. Start taking on: March 12, 2024   omeprazole  20 MG capsule Commonly known as: PRILOSEC TAKE 1 CAPSULE(20 MG) BY MOUTH DAILY   predniSONE  20 MG tablet Commonly known as: DELTASONE  Take 2 tablets (40 mg total) by mouth daily with breakfast for 3 days, THEN 1 tablet (20 mg total) daily with breakfast for 3 days. Start taking on: March 12, 2024   rosuvastatin  20 MG tablet Commonly known as: CRESTOR  Take 1 tablet (20 mg total) by mouth daily.   thiamine  100 MG tablet Commonly known as: Vitamin B-1 Take 1 tablet (100 mg total) by mouth daily.  Start taking on: March 12, 2024        Discharge Exam: [name]   03/10/24 9356  Weight: 68 kg      03/11/2024    3:45 PM 03/11/2024   12:43 PM 03/11/2024    9:13 AM  Vitals with BMI  Systolic 125 107 891  Diastolic 91 77 78  Pulse 74 77 75   General - Elderly/  Middle aged/ Young  *** female/ female, no apparent ***distress HEENT - PERRLA, EOMI, atraumatic head, non tender sinuses. Lung - Clear, ***rales, rhonchi, wheezes. Heart - S1, S2 heard, no murmurs, rubs, *** pedal edema. Abdomen - Soft, non tender ***, bowel sounds *** Neuro - Alert, awake and oriented x ***, non  focal exam. Skin - Warm and dry.  Condition at discharge: stable  The results of significant diagnostics from this hospitalization (including imaging, microbiology, ancillary and laboratory) are listed below for reference.   Imaging Studies: DG Chest 2 View Result Date: 03/10/2024 EXAM: 2 VIEW(S) XRAY OF THE CHEST 03/10/2024 07:01:36 AM COMPARISON: 05/12/2023 CLINICAL HISTORY: SOB FINDINGS: LUNGS AND PLEURA: No focal pulmonary opacity. No pleural effusion. No pneumothorax. HEART AND MEDIASTINUM: No acute abnormality of the cardiac and mediastinal silhouettes. Atherosclerotic calcifications. BONES AND SOFT TISSUES: Multilevel degenerative changes of thoracic spine. IMPRESSION: 1. No acute process. Electronically signed by: Waddell Calk MD 03/10/2024 07:11 AM EST RP Workstation: [id number]    Microbiology: Results for orders placed or performed during the hospital encounter of 05/12/23  Resp panel by RT-PCR (RSV, Flu A&B, Covid) Anterior Nasal Swab     Status: None   Collection Time: 05/12/23  7:48 PM   Specimen: Anterior Nasal Swab  Result Value Ref Range Status   SARS Coronavirus 2 by RT PCR NEGATIVE NEGATIVE Final    Comment: (NOTE) SARS-CoV-2 target nucleic acids are NOT DETECTED.  The SARS-CoV-2 RNA is generally detectable in upper respiratory specimens during the acute phase of infection. The lowest concentration of SARS-CoV-2 viral copies this assay can detect is 138 copies/mL. A negative result does not preclude SARS-Cov-2 infection and should not be used as the sole basis for treatment or other patient management decisions. A negative result may occur with  improper specimen collection/handling, submission of specimen other than nasopharyngeal swab, presence of viral mutation(s) within the areas targeted by this assay, and inadequate number of viral copies(<138 copies/mL). A negative result must be combined with clinical observations, patient history, and  epidemiological information. The expected result is Negative.  Fact Sheet for Patients:  bloggercourse.com  Fact Sheet for Healthcare Providers:  seriousbroker.it  This test is no t yet approved or cleared by the [place]  FDA and  has been authorized for detection and/or diagnosis of SARS-CoV-2 by FDA under an Emergency Use Authorization (EUA). This EUA will remain  in effect (meaning this test can be used) for the duration of the COVID-19 declaration under Section 564(b)(1) of the Act, 21 U.S.C.section 360bbb-3(b)(1), unless the authorization is terminated  or revoked sooner.       Influenza A by PCR NEGATIVE NEGATIVE Final   Influenza B by PCR NEGATIVE NEGATIVE Final    Comment: (NOTE) The Xpert Xpress SARS-CoV-2/FLU/RSV plus assay is intended as an aid in the diagnosis of influenza from Nasopharyngeal swab specimens and should not be used as a sole basis for treatment. Nasal washings and aspirates are unacceptable for Xpert Xpress SARS-CoV-2/FLU/RSV testing.  Fact Sheet for Patients: bloggercourse.com  Fact Sheet for Healthcare Providers: seriousbroker.it  This test is  not yet approved or cleared by the [place]  FDA and has been authorized for detection and/or diagnosis of SARS-CoV-2 by FDA under an Emergency Use Authorization (EUA). This EUA will remain in effect (meaning this test can be used) for the duration of the COVID-19 declaration under Section 564(b)(1) of the Act, 21 U.S.C. section 360bbb-3(b)(1), unless the authorization is terminated or revoked.     Resp Syncytial Virus by PCR NEGATIVE NEGATIVE Final    Comment: (NOTE) Fact Sheet for Patients: bloggercourse.com  Fact Sheet for Healthcare Providers: seriousbroker.it  This test is not yet approved or cleared by the [place]  FDA and has been  authorized for detection and/or diagnosis of SARS-CoV-2 by FDA under an Emergency Use Authorization (EUA). This EUA will remain in effect (meaning this test can be used) for the duration of the COVID-19 declaration under Section 564(b)(1) of the Act, 21 U.S.C. section 360bbb-3(b)(1), unless the authorization is terminated or revoked.  Performed at [facility], [address] [place], [place] [address]     Labs: CBC: Recent Labs  Lab 03/10/24 1223 03/11/24 0520  WBC 11.1* 9.7  NEUTROABS 6.4  --   HGB 15.6* 13.9  HCT 48.2* 42.7  MCV 96.4 95.3  PLT 370 325   Basic Metabolic Panel: Recent Labs  Lab 03/10/24 1223 03/11/24 0520  NA 143 140  K 3.9 4.2  CL 104 104  CO2 24 26  GLUCOSE 151* 122*  BUN 13 15  CREATININE 0.72 0.69  CALCIUM  9.7 9.7   Liver Function Tests: Recent Labs  Lab 03/10/24 1223 03/11/24 0520  AST 27 32  ALT 12 12  ALKPHOS 116 96  BILITOT 0.9 0.6  PROT 8.5* 7.7  ALBUMIN 4.6 4.2   CBG: No results for input(s): GLUCAP in the last 168 hours.  Discharge time spent: 36 minutes.  Signed: Concepcion Riser, MD [facility] 03/11/2024 "

## 2024-03-11 NOTE — ED Notes (Addendum)
 RN called to room , pt rolling back and forth , requesting breathing treatment, pt reports she is anxious, pt reports takes meds for anxiety but unsure of name, pt reports drinks 1 can of beer a day. RN has administered breathing tx and diplomatic services operational officer is paging MD for medication/ to come reassess pt.

## 2024-03-11 NOTE — Progress Notes (Addendum)
"  ° °      Overnight   NAME: Wendy Jensen MRN: [id number] DOB : 1960/11/12    Date of Service   03/11/2024   HPI/Events of Note    Notified by RN for elevated CIWA score.  Brief history 63 year old female medical history significant of alcohol abuse, allergic rhinitis, anxiety, GERD, dysphagia, esophageal spasm, nicotine dependence, mixed hyperlipidemia, aortic calcification, osteoarthritis. Admitted through ER with complaints of dyspnea associated with cough and wheezing since prior day. Denies fever, chills, rhinorrhea, sore throat, hemoptysis.  Bedside visit Patient currently housed in ER due to hospital capacity. Patient is approaching second day of admission. RN advises current CIWA is 8.  Patient advises that she drinks daily. Previous vital signs were within normal limits however notification by RN was provoked by obvious anxiety without other difficulties.  Therefore we will order CIWA order set and monitoring.   Interventions/ Plan   CIWA scale CIWA protocol (Ativan ) Continue all previous Admitting Physician orders      Lynwood Kipper BSN MSNA MSN ACNPC-AG Acute Care Nurse Practitioner [facility] [place]  "

## 2024-03-11 NOTE — ED Notes (Signed)
 Pt states she feels unsafe to be discharged, provider notified.

## 2024-03-11 NOTE — Evaluation (Signed)
 Physical Therapy Evaluation-1x Patient Details Name: Wendy Jensen MRN: [id number] DOB: 1960-11-20 Today's Date: 03/11/2024  History of Present Illness  63 yo female admitted with acute respiratory failure. Hx of ETOH abuse, anxeity, dysphagia, OA, esophageal spasm  Clinical Impression  On eval in ED, pt was Mod Ind with mobility. She declined any attempts at ambulation-reports she attempted ambulation earlier with nursing and got really anxious and dyspneic. She was agreeable to stand at EOB. Once standing, pt reported she had to urgently urinate. Offered to assist pt to bathroom a short distance away-pt adamantly refused. Offered to get a [facility] for pt-she again adamantly refused due to urgency. She instructed therapist to get a disposable bedpad and place the bedpad inside bedpan. Pt then took said bedpan and urinated into it while standing at bedside. She then handed bedpan back to therapist. This therapist disposed of soiled bedpad. Pt then assisted herself back to bed. Pt able to get back onto stretcher and self position for comfort. O2 91% on 2L during session. BP 121/75. Made RN aware, thru secure chat, that all of pt's telemetry leads had seemingly been removed prior to my arrival. Pt does not appear to have any acute physical therapy needs. Recommend nursing continue to assess ambulatory O2 sat needs as pt will allow. 1x eval.       If plan is discharge home, recommend the following:     Can travel by private vehicle        Equipment Recommendations None recommended by PT  Recommendations for Other Services       Functional Status Assessment Patient has had a recent decline in their functional status and demonstrates the ability to make significant improvements in function in a reasonable and predictable amount of time.     Precautions / Restrictions Precautions Precaution/Restrictions Comments: monitor O2 Restrictions Weight Bearing Restrictions Per Provider Order: No       Mobility  Bed Mobility Overal bed mobility: Modified Independent                  Transfers Overall transfer level: Modified independent Equipment used: None                    Ambulation/Gait               General Gait Details: NT-pt declined any attempts at ambulation. Fearful of getting dyspneic.  Stairs            Wheelchair Mobility     Tilt Bed    Modified Rankin (Stroke Patients Only)       Balance Overall balance assessment: No apparent balance deficits (not formally assessed)                                           Pertinent Vitals/Pain Pain Assessment Pain Assessment: No/denies pain    Home Living Family/patient expects to be discharged to:: Private residence Living Arrangements: Spouse/significant other   Type of Home: House Home Access: Stairs to enter   Secretary/administrator of Steps: 1   Home Layout: One level Home Equipment: None      Prior Function Prior Level of Function : Independent/Modified Independent                     Extremity/Trunk Assessment   Upper Extremity Assessment Upper Extremity Assessment: Generalized weakness    Lower  Extremity Assessment Lower Extremity Assessment: Generalized weakness    Cervical / Trunk Assessment Cervical / Trunk Assessment: Normal  Communication   Communication Communication: No apparent difficulties    Cognition Arousal: Alert Behavior During Therapy: WFL for tasks assessed/performed, Anxious   PT - Cognitive impairments: No apparent impairments                         Following commands: Intact       Cueing Cueing Techniques: Verbal cues     General Comments      Exercises     Assessment/Plan    PT Assessment Patient does not need any further PT services (nursing can perform ambulatory O2 sat testing as needed)  PT Problem List         PT Treatment Interventions      PT Goals (Current goals can  be found in the Care Plan section)  Acute Rehab PT Goals Patient Stated Goal: breathe better PT Goal Formulation: All assessment and education complete, DC therapy    Frequency       Co-evaluation               AM-PAC PT 6 Clicks Mobility  Outcome Measure Help needed turning from your back to your side while in a flat bed without using bedrails?: None Help needed moving from lying on your back to sitting on the side of a flat bed without using bedrails?: None Help needed moving to and from a bed to a chair (including a wheelchair)?: None Help needed standing up from a chair using your arms (e.g., wheelchair or bedside chair)?: None Help needed to walk in hospital room?: A Little Help needed climbing 3-5 steps with a railing? : A Little 6 Click Score: 22    End of Session   Activity Tolerance: Patient tolerated treatment well Patient left: in bed;with call bell/phone within reach;with family/visitor present        Time: 8643-8582 PT Time Calculation (min) (ACUTE ONLY): 21 min   Charges:   PT Evaluation $PT Eval Low Complexity: 1 Low   PT General Charges $$ ACUTE PT VISIT: 1 Visit            Dannial SQUIBB, PT Acute Rehabilitation  Office: [phone]

## 2024-03-12 ENCOUNTER — Encounter (HOSPITAL_COMMUNITY): Payer: Self-pay | Admitting: Internal Medicine

## 2024-03-12 ENCOUNTER — Observation Stay (HOSPITAL_BASED_OUTPATIENT_CLINIC_OR_DEPARTMENT_OTHER)

## 2024-03-12 ENCOUNTER — Other Ambulatory Visit: Payer: Self-pay

## 2024-03-12 ENCOUNTER — Inpatient Hospital Stay (HOSPITAL_COMMUNITY)
Admission: EM | Admit: 2024-03-12 | Discharge: 2024-03-15 | DRG: 190 | Disposition: A | Discharge status: Home / Self Care | Attending: Family Medicine | Admitting: Family Medicine

## 2024-03-12 ENCOUNTER — Ambulatory Visit: Payer: Self-pay

## 2024-03-12 ENCOUNTER — Emergency Department (HOSPITAL_COMMUNITY)

## 2024-03-12 ENCOUNTER — Other Ambulatory Visit (HOSPITAL_COMMUNITY): Payer: Self-pay

## 2024-03-12 DIAGNOSIS — I700 Atherosclerosis of aorta: Secondary | ICD-10-CM | POA: Diagnosis present

## 2024-03-12 DIAGNOSIS — I10 Essential (primary) hypertension: Secondary | ICD-10-CM | POA: Diagnosis present

## 2024-03-12 DIAGNOSIS — Z8249 Family history of ischemic heart disease and other diseases of the circulatory system: Secondary | ICD-10-CM

## 2024-03-12 DIAGNOSIS — E669 Obesity, unspecified: Secondary | ICD-10-CM | POA: Diagnosis not present

## 2024-03-12 DIAGNOSIS — R9431 Abnormal electrocardiogram [ECG] [EKG]: Secondary | ICD-10-CM | POA: Diagnosis present

## 2024-03-12 DIAGNOSIS — R7989 Other specified abnormal findings of blood chemistry: Secondary | ICD-10-CM | POA: Diagnosis present

## 2024-03-12 DIAGNOSIS — R7303 Prediabetes: Secondary | ICD-10-CM | POA: Diagnosis present

## 2024-03-12 DIAGNOSIS — E7800 Pure hypercholesterolemia, unspecified: Secondary | ICD-10-CM | POA: Diagnosis present

## 2024-03-12 DIAGNOSIS — K219 Gastro-esophageal reflux disease without esophagitis: Secondary | ICD-10-CM | POA: Diagnosis not present

## 2024-03-12 DIAGNOSIS — F411 Generalized anxiety disorder: Secondary | ICD-10-CM | POA: Diagnosis not present

## 2024-03-12 DIAGNOSIS — R0609 Other forms of dyspnea: Secondary | ICD-10-CM

## 2024-03-12 DIAGNOSIS — J9601 Acute respiratory failure with hypoxia: Secondary | ICD-10-CM | POA: Diagnosis present

## 2024-03-12 DIAGNOSIS — J441 Chronic obstructive pulmonary disease with (acute) exacerbation: Principal | ICD-10-CM | POA: Diagnosis present

## 2024-03-12 DIAGNOSIS — Z7951 Long term (current) use of inhaled steroids: Secondary | ICD-10-CM

## 2024-03-12 DIAGNOSIS — I447 Left bundle-branch block, unspecified: Secondary | ICD-10-CM | POA: Diagnosis present

## 2024-03-12 DIAGNOSIS — Z6824 Body mass index (BMI) 24.0-24.9, adult: Secondary | ICD-10-CM

## 2024-03-12 DIAGNOSIS — F17210 Nicotine dependence, cigarettes, uncomplicated: Secondary | ICD-10-CM | POA: Diagnosis present

## 2024-03-12 DIAGNOSIS — T380X5A Adverse effect of glucocorticoids and synthetic analogues, initial encounter: Secondary | ICD-10-CM | POA: Diagnosis present

## 2024-03-12 DIAGNOSIS — Z8711 Personal history of peptic ulcer disease: Secondary | ICD-10-CM

## 2024-03-12 DIAGNOSIS — E782 Mixed hyperlipidemia: Secondary | ICD-10-CM | POA: Diagnosis present

## 2024-03-12 LAB — BASIC METABOLIC PANEL WITH GFR
Anion gap: 11 (ref 5–15)
BUN: 16 mg/dL (ref 8–23)
CO2: 23 mmol/L (ref 22–32)
Calcium: 8.8 mg/dL — ABNORMAL LOW (ref 8.9–10.3)
Chloride: 101 mmol/L (ref 98–111)
Creatinine, Ser: 0.69 mg/dL (ref 0.44–1.00)
GFR, Estimated: >60 mL/min
Glucose, Bld: 267 mg/dL — ABNORMAL HIGH (ref 70–99)
Potassium: 3.9 mmol/L (ref 3.5–5.1)
Sodium: 135 mmol/L (ref 135–145)

## 2024-03-12 LAB — ECHOCARDIOGRAM COMPLETE
AR max vel: 2.9 cm2
AV Area VTI: 2.93 cm2
AV Area mean vel: 2.38 cm2
AV Mean grad: 3 mmHg
AV Peak grad: 4.2 mmHg
Ao pk vel: 1.03 m/s
Area-P 1/2: 2.27 cm2
S' Lateral: 2.7 cm

## 2024-03-12 LAB — CBC WITH DIFFERENTIAL/PLATELET
Abs Immature Granulocytes: 0.04 K/uL (ref 0.00–0.07)
Basophils Absolute: 0 K/uL (ref 0.0–0.1)
Basophils Relative: 0 %
Eosinophils Absolute: 0.4 K/uL (ref 0.0–0.5)
Eosinophils Relative: 4 %
HCT: 43.1 % (ref 36.0–46.0)
Hemoglobin: 13.8 g/dL (ref 12.0–15.0)
Immature Granulocytes: 0 %
Lymphocytes Relative: 27 %
Lymphs Abs: 3.2 K/uL (ref 0.7–4.0)
MCH: 31.1 pg (ref 26.0–34.0)
MCHC: 32 g/dL (ref 30.0–36.0)
MCV: 97.1 fL (ref 80.0–100.0)
Monocytes Absolute: 0.7 K/uL (ref 0.1–1.0)
Monocytes Relative: 6 %
Neutro Abs: 7.3 K/uL (ref 1.7–7.7)
Neutrophils Relative %: 63 %
Platelets: 327 K/uL (ref 150–400)
RBC: 4.44 MIL/uL (ref 3.87–5.11)
RDW: 11.9 % (ref 11.5–15.5)
WBC: 11.8 K/uL — ABNORMAL HIGH (ref 4.0–10.5)
nRBC: 0 % (ref 0.0–0.2)

## 2024-03-12 LAB — RESPIRATORY PANEL BY PCR

## 2024-03-12 LAB — TROPONIN T, HIGH SENSITIVITY
Troponin T High Sensitivity: 52 ng/L — ABNORMAL HIGH (ref 0–19)
Troponin T High Sensitivity: 47 ng/L — ABNORMAL HIGH (ref 0–19)

## 2024-03-12 LAB — I-STAT VENOUS BLOOD GAS, ED
Acid-base deficit: 1 mmol/L (ref 0.0–2.0)
Bicarbonate: 25.6 mmol/L (ref 20.0–28.0)
Calcium, Ion: 1.08 mmol/L — ABNORMAL LOW (ref 1.15–1.40)
HCT: 42 % (ref 36.0–46.0)
Hemoglobin: 14.3 g/dL (ref 12.0–15.0)
O2 Saturation: 100 %
Potassium: 4.2 mmol/L (ref 3.5–5.1)
Sodium: 136 mmol/L (ref 135–145)
TCO2: 27 mmol/L (ref 22–32)
pCO2, Ven: 49.5 mmHg (ref 44–60)
pH, Ven: 7.322 (ref 7.25–7.43)
pO2, Ven: 200 mmHg — ABNORMAL HIGH (ref 32–45)

## 2024-03-12 LAB — PRO BRAIN NATRIURETIC PEPTIDE: Pro Brain Natriuretic Peptide: 1981 pg/mL — ABNORMAL HIGH

## 2024-03-12 LAB — I-STAT CG4 LACTIC ACID, ED: Lactic Acid, Venous: 2 mmol/L (ref 0.5–1.9)

## 2024-03-12 MED ORDER — ALBUTEROL SULFATE (2.5 MG/3ML) 0.083% IN NEBU
10.0000 mg/h | INHALATION_SOLUTION | Freq: Once | RESPIRATORY_TRACT | Status: AC
  Administered 2024-03-12: 10 mg/h via RESPIRATORY_TRACT

## 2024-03-12 MED ORDER — PREDNISONE 20 MG PO TABS
40.0000 mg | ORAL_TABLET | Freq: Every day | ORAL | Status: DC
  Administered 2024-03-13 – 2024-03-15 (×3): 40 mg via ORAL
  Filled 2024-03-12 (×3): qty 2

## 2024-03-12 MED ORDER — ALBUTEROL SULFATE (2.5 MG/3ML) 0.083% IN NEBU
INHALATION_SOLUTION | RESPIRATORY_TRACT | Status: AC
  Filled 2024-03-12: qty 12

## 2024-03-12 MED ORDER — ACETAMINOPHEN 650 MG RE SUPP: 650.0000 mg | Freq: Four times a day (QID) | RECTAL | Status: DC | PRN

## 2024-03-12 MED ORDER — IPRATROPIUM BROMIDE 0.02 % IN SOLN
0.5000 mg | Freq: Four times a day (QID) | RESPIRATORY_TRACT | Status: DC
  Administered 2024-03-12 – 2024-03-13 (×4): 0.5 mg via RESPIRATORY_TRACT
  Filled 2024-03-12 (×4): qty 2.5

## 2024-03-12 MED ORDER — ENOXAPARIN SODIUM 40 MG/0.4ML IJ SOSY
40.0000 mg | PREFILLED_SYRINGE | INTRAMUSCULAR | Status: DC
  Administered 2024-03-12 – 2024-03-14 (×3): 40 mg via SUBCUTANEOUS
  Filled 2024-03-12 (×3): qty 0.4

## 2024-03-12 MED ORDER — POLYETHYLENE GLYCOL 3350 17 G PO PACK
17.0000 g | PACK | Freq: Every day | ORAL | Status: DC | PRN
  Administered 2024-03-14: 17 g via ORAL
  Filled 2024-03-12: qty 1

## 2024-03-12 MED ORDER — SODIUM CHLORIDE 0.9% FLUSH
3.0000 mL | Freq: Two times a day (BID) | INTRAVENOUS | Status: DC
  Administered 2024-03-12 – 2024-03-15 (×7): 3 mL via INTRAVENOUS

## 2024-03-12 MED ORDER — MAGNESIUM SULFATE 2 GM/50ML IV SOLN
2.0000 g | Freq: Once | INTRAVENOUS | Status: AC
  Administered 2024-03-12: 2 g via INTRAVENOUS

## 2024-03-12 MED ORDER — ACETAMINOPHEN 325 MG PO TABS: 650.0000 mg | ORAL_TABLET | Freq: Four times a day (QID) | ORAL | Status: DC | PRN

## 2024-03-12 MED ORDER — ALBUTEROL SULFATE (2.5 MG/3ML) 0.083% IN NEBU: 2.5000 mg | INHALATION_SOLUTION | RESPIRATORY_TRACT | Status: DC | PRN

## 2024-03-12 NOTE — Telephone Encounter (Signed)
 FYI

## 2024-03-12 NOTE — Telephone Encounter (Signed)
 FYI Only or Action Required?: FYI only for provider: ED advised.  Patient was last seen in primary care on 05/15/2023 by Wendy Bleacher, MD.  Called Nurse Triage reporting Shortness of Breath.  Symptoms began several days ago.  Interventions attempted: Nothing.  Symptoms are: unchanged.  Triage Disposition: Go to ED Now (Notify PCP)  Patient/caregiver understands and will follow disposition?: Unsure Reason for Disposition  [1] MODERATE difficulty breathing (e.g., speaks in phrases, SOB even at rest, pulse 100-120) AND [2] NEW-onset or WORSE than normal  Answer Assessment - Initial Assessment Questions Patient was recently in ED, thinks she needs to be readmitted, hospital told her to call PCP. Called [facility], [place] Dr. Tanda is out of office until 03/18/24, and the provider calling to admit a patient is not something that can be done, needs to go to ED. Relayed information to patient and she hung up.   1. RESPIRATORY STATUS: Describe your breathing? (e.g., wheezing, shortness of breath, unable to speak, severe coughing)      Wheezing, SOB, unable to speak  2. ONSET: When did this breathing problem begin?      A few days  3. PATTERN Does the difficult breathing come and go, or has it been constant since it started?      *No Answer* 4. SEVERITY: How bad is your breathing? (e.g., mild, moderate, severe)      *No Answer* 5. RECURRENT SYMPTOM: Have you had difficulty breathing before? If Yes, ask: When was the last time? and What happened that time?      *No Answer* 6. CARDIAC HISTORY: Do you have any history of heart disease? (e.g., heart attack, angina, bypass surgery, angioplasty)      *No Answer* 7. LUNG HISTORY: Do you have any history of lung disease?  (e.g., pulmonary embolus, asthma, emphysema)     COPD  8. CAUSE: What do you think is causing the breathing problem?      *No Answer* 9. OTHER SYMPTOMS: Do you have any other symptoms? (e.g., chest pain,  cough, dizziness, fever, runny nose)     Feeling dizzy, weak,  10. O2 SATURATION MONITOR:  Do you use an oxygen saturation monitor (pulse oximeter) at home? If Yes, ask: What is your reading (oxygen level) today? What is your usual oxygen saturation reading? (e.g., 95%)       *No Answer*  Protocols used: Breathing Difficulty-A-AH  Copied from CRM #[id number]. Topic: Clinical - Red Word Triage >> Mar 12, 2024 10:30 AM Terri MATSU wrote: [facility] that prompted transfer to Nurse Triage: patient stated the hospital released her too soon. the hospital stated she has to call her pcp so she can get her in and of course [facility] is not answering .  and patient stated she can barely breathe

## 2024-03-12 NOTE — Telephone Encounter (Signed)
 Attempted to call patient but no response, noted a second triage encounter for today at 10:31am was created. See NT 10:31am for details of triage call. Signing to close.

## 2024-03-12 NOTE — H&P (Signed)
 " History and Physical   Wendy Jensen FMW:[id number] DOB: May 30, 1960 DOA: 03/12/2024  PCP: Tanda Bleacher, MD   Patient coming from: Home  Chief Complaint: Shortness of breath  HPI: Wendy Jensen is a 63 y.o. female with medical history significant of hypertension, hyperlipidemia, GERD, reactive airway disease/presumed COPD, obesity, anxiety presenting with shortness of breath/respiratory distress.  Patient was just recently admitted 12/29 - 12/30 for shortness of breath/respiratory failure.  No history of COPD but has smoking history and it was presumed that she has undiagnosed COPD that was an exacerbation.  Patient presented with wheezing and symptoms improved with steroids and nebulizer treatments.  Patient was discharged with plan for prednisone  taper and albuterol .  Patient not yet able to pick up her prescriptions.  Had progressive shortness of breath today ultimately calling EMS.  Patient found to be wheezing with saturations of 65% on room air.  Patient placed on nonrebreather and received albuterol , Solu-Medrol, DuoNeb.  Had some confusion in the EMS truck and route.  Mentation has improved in the ED, still drowsy  Patient denies fevers, chills, chest pain, abdominal pain, constipation, diarrhea, nausea, vomiting.  ED Course: Vital signs in the ED notable for blood pressure in the 100s-130s systolic, respirate in the teens-20s.  Initially requiring 9 L oxygen.  Lab workup included BMP with glucose 267, calcium  8.8.  CBC with leukocytosis to 11.8 in setting of recent steroids.  proBNP significantly elevated to 1981.  Lactic acid 2.0 with repeat pending.  Chest x-ray showed no acute abnormality.  VBG showed normal pH and normal pCO2.  Patient received 2 g magnesium  and albuterol  neb in the ED in addition to EMS interventions.  Review of Systems: As per HPI otherwise all other systems reviewed and are negative.   Past Medical History:  Diagnosis Date   GERD  (gastroesophageal reflux disease)    Hypertension    Peptic ulcer    Reflux     Past Surgical History:  Procedure Laterality Date   COLONOSCOPY     ESOPHAGEAL DILATION     TUBAL LIGATION     UPPER GI ENDOSCOPY      Social History  reports that she quit smoking about 5 years ago. Her smoking use included cigarettes. She smoked an average of 0.2 packs per day. She has never used smokeless tobacco. She reports current alcohol use. She reports that she does not use drugs.  Allergies[1]  Family History  Problem Relation Age of Onset   Hypertension Father   Reviewed on admission  Prior to Admission medications  Medication Sig Start Date End Date Taking? Authorizing Provider  albuterol  (VENTOLIN  HFA) 108 (90 Base) MCG/ACT inhaler Inhale 2 puffs into the lungs every 6 (six) hours as needed for wheezing or shortness of breath. 03/11/24   Darci Pore, MD  carvedilol  (COREG ) 25 MG tablet Take 1 tablet (25 mg total) by mouth 2 (two) times daily with a meal. 02/04/24   Tanda Bleacher, MD  chlorthalidone  (HYGROTON ) 25 MG tablet Take 1 tablet (25 mg total) by mouth daily. Please call [phone] to schedule an October appointment for future refills. Thank you. 07/17/23   Kate Lonni CROME, MD  clonazePAM  (KLONOPIN ) 0.5 MG tablet Take 1 tablet (0.5 mg total) by mouth 2 (two) times daily as needed (anxiety). 03/11/24   Darci Pore, MD  folic acid  (FOLVITE ) 1 MG tablet Take 1 tablet (1 mg total) by mouth daily. 03/12/24   Darci Pore, MD  guaiFENesin  (MUCINEX ) 600 MG 12 hr tablet Take  2 tablets (1,200 mg total) by mouth 2 (two) times daily for 10 days. 03/11/24 03/21/24  Darci Pore, MD  Multiple Vitamin (MULTIVITAMIN WITH MINERALS) TABS tablet Take 1 tablet by mouth daily. 03/12/24   Darci Pore, MD  omeprazole  (PRILOSEC) 20 MG capsule TAKE 1 CAPSULE(20 MG) BY MOUTH DAILY Patient not taking: Reported on 03/10/2024 02/01/24   Tanda Bleacher, MD   predniSONE  (DELTASONE ) 20 MG tablet Take 2 tablets (40 mg total) by mouth daily with breakfast for 3 days, THEN 1 tablet (20 mg total) daily with breakfast for 3 days. 03/12/24 03/18/24  Darci Pore, MD  rosuvastatin  (CRESTOR ) 20 MG tablet Take 1 tablet (20 mg total) by mouth daily. Patient not taking: Reported on 03/10/2024 05/15/23   Tanda Bleacher, MD  thiamine  (VITAMIN B-1) 100 MG tablet Take 1 tablet (100 mg total) by mouth daily. 03/12/24   Darci Pore, MD    Physical Exam: Vitals:   03/12/24 1152 03/12/24 1156 03/12/24 1315 03/12/24 1326  BP:  138/78 115/69   Pulse:  85 64 78  Resp: (!) 28 (!) 21 15 17   Temp:  (!) 97.4 F (36.3 C)    TempSrc:  Axillary    SpO2:  100% (!) 89% 97%    Physical Exam Constitutional:      General: She is not in acute distress.    Comments: Drowsy, mildly ill appearing  HENT:     Head: Normocephalic and atraumatic.     Mouth/Throat:     Mouth: Mucous membranes are moist.     Pharynx: Oropharynx is clear.  Eyes:     Extraocular Movements: Extraocular movements intact.     Pupils: Pupils are equal, round, and reactive to light.  Cardiovascular:     Rate and Rhythm: Normal rate and regular rhythm.     Pulses: Normal pulses.     Heart sounds: Normal heart sounds.  Pulmonary:     Effort: Pulmonary effort is normal. No respiratory distress.     Breath sounds: Wheezing and rhonchi present.  Abdominal:     General: Bowel sounds are normal. There is no distension.     Palpations: Abdomen is soft.     Tenderness: There is no abdominal tenderness.  Musculoskeletal:        General: No swelling or deformity.  Skin:    General: Skin is warm and dry.  Neurological:     General: No focal deficit present.     Mental Status: Mental status is at baseline.    Labs on Admission: I have personally reviewed following labs and imaging studies  CBC: Recent Labs  Lab 03/10/24 1223 03/11/24 0520 03/12/24 1151 03/12/24 1213  WBC 11.1*  9.7 11.8*  --   NEUTROABS 6.4  --  7.3  --   HGB 15.6* 13.9 13.8 14.3  HCT 48.2* 42.7 43.1 42.0  MCV 96.4 95.3 97.1  --   PLT 370 325 327  --     Basic Metabolic Panel: Recent Labs  Lab 03/10/24 1223 03/11/24 0520 03/12/24 1151 03/12/24 1213  NA 143 140 135 136  K 3.9 4.2 3.9 4.2  CL 104 104 101  --   CO2 24 26 23   --   GLUCOSE 151* 122* 267*  --   BUN 13 15 16   --   CREATININE 0.72 0.69 0.69  --   CALCIUM  9.7 9.7 8.8*  --     GFR: Estimated Creatinine Clearance: 65.6 mL/min (by C-G formula based on SCr of 0.69 mg/dL).  Liver Function Tests: Recent Labs  Lab 03/10/24 1223 03/11/24 0520  AST 27 32  ALT 12 12  ALKPHOS 116 96  BILITOT 0.9 0.6  PROT 8.5* 7.7  ALBUMIN 4.6 4.2    Urine analysis:    Component Value Date/Time   COLORURINE STRAW (A) 12/27/2017 1248   APPEARANCEUR CLEAR 12/27/2017 1248   LABSPEC 1.001 (L) 12/27/2017 1248   PHURINE 6.0 12/27/2017 1248   GLUCOSEU NEGATIVE 12/27/2017 1248   HGBUR SMALL (A) 12/27/2017 1248   BILIRUBINUR NEGATIVE 12/27/2017 1248   KETONESUR NEGATIVE 12/27/2017 1248   PROTEINUR NEGATIVE 12/27/2017 1248   UROBILINOGEN 2.0 (H) 11/09/2009 1504   NITRITE NEGATIVE 12/27/2017 1248   LEUKOCYTESUR SMALL (A) 12/27/2017 1248    Radiological Exams on Admission: DG Chest Port 1 View Result Date: 03/12/2024 EXAM: 1 VIEW(S) XRAY OF THE CHEST 03/12/2024 12:14:50 PM COMPARISON: 03/10/2024 CLINICAL HISTORY: sob sob FINDINGS: LUNGS AND PLEURA: No focal pulmonary opacity. No pleural effusion. No pneumothorax. HEART AND MEDIASTINUM: Aortic atherosclerosis. No acute abnormality of the cardiac and mediastinal silhouettes. BONES AND SOFT TISSUES: No acute osseous abnormality. IMPRESSION: 1. No active cardiopulmonary disease. Electronically signed by: Franky Crease MD 03/12/2024 12:57 PM EST RP Workstation: [id number]   EKG: Independently reviewed.  Sinus rhythm at 87 bpm.  Nonspecific T wave changes.  Some intermittent baseline artifact.  QTc  prolonged at 539. ?Q waves.  Assessment/Plan Principal Problem:   Acute respiratory failure with hypoxia (HCC) Active Problems:   Essential hypertension   GERD   Generalized anxiety disorder   Pure hypercholesterolemia   Obesity   Acute respiratory with hypoxia Reactive airway disease Rule out CHF > Patient presenting with respiratory distress/shortness of breath after being unable to pick up prescribed medications to take after recent admission for acute respiratory failure. > During admission 12/29-12/30 patient was suspected to have acute exacerbation of previously undiagnosed COPD.  Patient improved with steroids and nebulizer treatments during admission and was prescribed prednisone  and albuterol  on discharge but not able to pick those up so far today. > Symptoms worsened throughout the day ultimately called EMS was found to be saturating 60% on room air.  Has received Solu-Medrol, DuoNeb, albuterol  from EMS and additional albuterol  and magnesium  from the ED. > Initially requiring 9L to maintain saturations. > Was noted to have proBNP elevated to 1981, will check echocardiogram to rule out CHF component (though could be strain component.).  Also with questionable new Q waves on EKG will check troponin. - Monitor on progressive unit overnight - Continue supplemental oxygen, wean as tolerated - Continue with scheduled steroids - Scheduled Atrovent, as needed albuterol   - Follow-up echocardiogram - Follow-up troponin - Meds to bed on discharge if able  Hypertension - Confirm home medications  Hyperlipidemia - Reportedly not taking statin, will confirm  Obesity - Noted  DVT prophylaxis: Lovenox  Code Status:   Full Family Communication:  Updated at bedside  Disposition Plan:   Patient is from:  Home  Anticipated DC to:  Home  Anticipated DC date:  1 to 2 days  Anticipated DC barriers: None  Consults called:  None Admission status:  Observation, progressive  Severity of  Illness: The appropriate patient status for this patient is OBSERVATION. Observation status is judged to be reasonable and necessary in order to provide the required intensity of service to ensure the patient's safety. The patient's presenting symptoms, physical exam findings, and initial radiographic and laboratory data in the context of their medical condition is felt to place them  at decreased risk for further clinical deterioration. Furthermore, it is anticipated that the patient will be medically stable for discharge from the hospital within 2 midnights of admission.    Marsa KATHEE Scurry MD [facility]  How to contact the [facility] Attending or Consulting provider 7A - 7P or covering provider during after hours 7P -7A, for this patient?   Check the care team in [facility] and look for a) attending/consulting [facility] provider listed and b) the [facility] team listed Log into [email or web address] and use [place]'s universal password to access. If you do not have the password, please contact the hospital operator. Locate the [facility] provider you are looking for under [facility] and page to a number that you can be directly reached. If you still have difficulty reaching the provider, please page the [facility] (Director on Call) for the Hospitalists listed on amion for assistance.  03/12/2024, 1:45 PM       [1]  Allergies Allergen Reactions   Amlodipine  Benzoate Other (See Comments)    Patient reports this made her feel sick.   Amlodipine  Besylate     Patient reports this makes her feel sick.   Hydralazine  Other (See Comments)   Hydrochlorothiazide  Other (See Comments)   Lisinopril Other (See Comments)    Patient reports this made her feel sick.   Losartan Other (See Comments)   Metoprolol Tartrate     Patient reports this makes her feel sick.   Spironolactone      Nausea    "

## 2024-03-12 NOTE — ED Notes (Signed)
 Pt removed continuous albuterol  treatment. Seems to be in less distress at this time. O2 90% on room air. Pt placed on 4L [place] and sat at 95%

## 2024-03-12 NOTE — ED Notes (Signed)
 CCMD called.

## 2024-03-12 NOTE — Telephone Encounter (Addendum)
 Patient disconnected during wait time. Attempted to contact patient x 1 to discuss symptoms; no answer, Will attempt to contact patient at a later time to further discuss concerns.       Copied from CRM #[id number]. Topic: Clinical - Red Word Triage >> Mar 12, 2024 10:16 AM Farrel B wrote: [facility] that prompted transfer to Nurse Triage: SOB

## 2024-03-12 NOTE — ED Triage Notes (Signed)
 Pt BIB EMS from home, dx with COPD 2 days ago at [facility] and not able to pick up any of her prescribed medications. On EMS arrival pt O2 sat on room air 65%. Pt lung sounds tight with not much movement. Given 5mg  Albuterol , SoluMedrol, DuoNeb with EMS with minimal relief. Pt on nonrebreather on arrival. Pt confused and pulling off mask in truck. Other VSS.

## 2024-03-12 NOTE — ED Provider Notes (Signed)
 " [place] EMERGENCY DEPARTMENT AT [facility] Provider Note   CSN: [id number] Arrival date & time: 03/12/24  1143     Patient presents with: Respiratory Distress   Shaneece Lariviere is a 63 y.o. female.   The history is provided by the patient, the EMS personnel and medical records. No language interpreter was used.     63 year old female with newly diagnosed history of COPD, anxiety, GERD, esophageal spasm, alcohol and tobacco use brought here via EMS from home for complaints of shortness of breath.  Patient was seen in the ED several days ago for shortness of breath and subsequently admitted to the hospital with a diagnosis of acute respiratory failure with hypoxia in the setting of COPD.  Patient receiving treatment in the hospital and was discharged yesterday with prescription for albuterol  inhaler and prednisone  taper.  She has not received the medication yet and throughout the day today she became increasingly short of breath and wheezing.  EMS initially evaluated patient and gave patient 5 mg of albuterol , Solu-Medrol, DuoNebs without adequate relief.  Patient was found to be hypoxic with oxygen of 65% on room air.  EMS report patient then became acutely confused and was attempting to pull off the mask in the truck.  History is limited due to patient's current situation.  Prior to Admission medications  Medication Sig Start Date End Date Taking? Authorizing Provider  albuterol  (VENTOLIN  HFA) 108 (90 Base) MCG/ACT inhaler Inhale 2 puffs into the lungs every 6 (six) hours as needed for wheezing or shortness of breath. 03/11/24   Darci Pore, MD  carvedilol  (COREG ) 25 MG tablet Take 1 tablet (25 mg total) by mouth 2 (two) times daily with a meal. 02/04/24   Tanda Bleacher, MD  chlorthalidone  (HYGROTON ) 25 MG tablet Take 1 tablet (25 mg total) by mouth daily. Please call [phone] to schedule an October appointment for future refills. Thank you. 07/17/23   Kate Lonni CROME, MD  clonazePAM  (KLONOPIN ) 0.5 MG tablet Take 1 tablet (0.5 mg total) by mouth 2 (two) times daily as needed (anxiety). 03/11/24   Darci Pore, MD  folic acid  (FOLVITE ) 1 MG tablet Take 1 tablet (1 mg total) by mouth daily. 03/12/24   Darci Pore, MD  guaiFENesin  (MUCINEX ) 600 MG 12 hr tablet Take 2 tablets (1,200 mg total) by mouth 2 (two) times daily for 10 days. 03/11/24 03/21/24  Darci Pore, MD  Multiple Vitamin (MULTIVITAMIN WITH MINERALS) TABS tablet Take 1 tablet by mouth daily. 03/12/24   Darci Pore, MD  omeprazole  (PRILOSEC) 20 MG capsule TAKE 1 CAPSULE(20 MG) BY MOUTH DAILY Patient not taking: Reported on 03/10/2024 02/01/24   Tanda Bleacher, MD  predniSONE  (DELTASONE ) 20 MG tablet Take 2 tablets (40 mg total) by mouth daily with breakfast for 3 days, THEN 1 tablet (20 mg total) daily with breakfast for 3 days. 03/12/24 03/18/24  Darci Pore, MD  rosuvastatin  (CRESTOR ) 20 MG tablet Take 1 tablet (20 mg total) by mouth daily. Patient not taking: Reported on 03/10/2024 05/15/23   Tanda Bleacher, MD  thiamine  (VITAMIN B-1) 100 MG tablet Take 1 tablet (100 mg total) by mouth daily. 03/12/24   Darci Pore, MD    Allergies: Amlodipine  benzoate, Amlodipine  besylate, Hydralazine , Hydrochlorothiazide , Lisinopril, Losartan, Metoprolol tartrate, and Spironolactone     Review of Systems  Unable to perform ROS: Severe respiratory distress    Updated Vital Signs BP 115/69   Pulse 78   Temp (!) 97.4 F (36.3 C) (Axillary)   Resp  17   SpO2 97%   Physical Exam Vitals and nursing note reviewed.  Constitutional:      General: She is in acute distress.     Appearance: She is well-developed. She is diaphoretic.  HENT:     Head: Atraumatic.  Eyes:     Conjunctiva/sclera: Conjunctivae normal.  Cardiovascular:     Rate and Rhythm: Tachycardia present.     Pulses: Normal pulses.     Heart sounds: Normal heart sounds.   Pulmonary:     Effort: Pulmonary effort is normal.     Breath sounds: Rhonchi present. No wheezing or rales.  Abdominal:     Palpations: Abdomen is soft.  Musculoskeletal:     Cervical back: Neck supple.     Right lower leg: No edema.     Left lower leg: No edema.  Skin:    Findings: No rash.  Neurological:     Mental Status: She is alert. She is disoriented.  Psychiatric:        Mood and Affect: Mood normal.     (all labs ordered are listed, but only abnormal results are displayed) Labs Reviewed  BASIC METABOLIC PANEL WITH GFR - Abnormal; Notable for the following components:      Result Value   Glucose, Bld 267 (*)    Calcium  8.8 (*)    All other components within normal limits  CBC WITH DIFFERENTIAL/PLATELET - Abnormal; Notable for the following components:   WBC 11.8 (*)    All other components within normal limits  PRO BRAIN NATRIURETIC PEPTIDE - Abnormal; Notable for the following components:   Pro Brain Natriuretic Peptide 1,981.0 (*)    All other components within normal limits  I-STAT VENOUS BLOOD GAS, ED - Abnormal; Notable for the following components:   pO2, Ven 200 (*)    Calcium , Ion 1.08 (*)    All other components within normal limits  I-STAT CG4 LACTIC ACID, ED - Abnormal; Notable for the following components:   Lactic Acid, Venous 2.0 (*)    All other components within normal limits  RESPIRATORY PANEL BY PCR  TROPONIN T, HIGH SENSITIVITY    EKG: None ED ECG REPORT   Date: 03/12/2024  Rate: 87  Rhythm: normal sinus rhythm  QRS Axis: left  Intervals: QT prolonged  ST/T Wave abnormalities: nonspecific T wave changes  Conduction Disutrbances:left anterior fascicular block  Narrative Interpretation:   Old EKG Reviewed: unchanged  I have personally reviewed the EKG tracing and agree with the computerized printout as noted.   Radiology: [facility] Chest Port 1 View Result Date: 03/12/2024 EXAM: 1 VIEW(S) XRAY OF THE CHEST 03/12/2024 12:14:50 PM  COMPARISON: 03/10/2024 CLINICAL HISTORY: sob sob FINDINGS: LUNGS AND PLEURA: No focal pulmonary opacity. No pleural effusion. No pneumothorax. HEART AND MEDIASTINUM: Aortic atherosclerosis. No acute abnormality of the cardiac and mediastinal silhouettes. BONES AND SOFT TISSUES: No acute osseous abnormality. IMPRESSION: 1. No active cardiopulmonary disease. Electronically signed by: Franky Crease MD 03/12/2024 12:57 PM EST RP Workstation: [id number]     .Critical Care  Performed by: Nivia Colon, PA-C Authorized by: Nivia Colon, PA-C   Critical care provider statement:    Critical care time (minutes):  33   Critical care was time spent personally by me on the following activities:  Development of treatment plan with patient or surrogate, discussions with consultants, evaluation of patient's response to treatment, examination of patient, ordering and review of laboratory studies, ordering and review of radiographic studies, ordering and performing treatments and interventions,  pulse oximetry, re-evaluation of patient's condition and review of old charts    Medications Ordered in the ED  albuterol  (PROVENTIL ,VENTOLIN ) solution continuous neb (has no administration in time range)  magnesium  sulfate IVPB 2 g 50 mL (has no administration in time range)                                    Medical Decision Making Amount and/or Complexity of Data Reviewed Labs: ordered. Radiology: ordered.  Risk Prescription drug management. Decision regarding hospitalization.   SpO2 100%   25:85 AM  63 year old female with newly diagnosed history of COPD, anxiety, GERD, esophageal spasm, alcohol and tobacco use brought here via EMS from home for complaints of shortness of breath.  Patient was seen in the ED several days ago for shortness of breath and subsequently admitted to the hospital with a diagnosis of acute respiratory failure with hypoxia in the setting of COPD.  Patient receiving treatment in the  hospital and was discharged yesterday with prescription for albuterol  inhaler and prednisone  taper.  She has not received the medication yet and throughout the day today she became increasingly short of breath and wheezing.  EMS initially evaluated patient and gave patient 5 mg of albuterol , Solu-Medrol, DuoNebs without adequate relief.  Patient was found to be hypoxic with oxygen of 65% on room air.  EMS report patient then became acutely confused and was attempting to pull off the mask in the truck.  History is limited due to patient's current situation.  On exam, patient is diaphoretic, in moderate respiratory discomfort, decreased lung sounds with both inspiratory and expiratory wheezes heard.  Patient was promptly placed on cardiac monitoring, respiratory therapist is available at bedside.  Will provide patient with continuous nebulizing as well as magnesium .  Will consider BiPAP if no significant improvement.  -Labs ordered, independently viewed and interpreted by me.  Labs remarkable for elevated lactic acid of 2.0.  CBG 276, WBC 11.8, proBNP 1981 -The patient was maintained on a cardiac monitor.  I personally viewed and interpreted the cardiac monitored which showed an underlying rhythm of: sinus tachycardia -Imaging independently viewed and interpreted by me and I agree with radiologist's interpretation.  Result remarkable for CXR unremarkable -This patient presents to the ED for concern of sob, this involves an extensive number of treatment options, and is a complaint that carries with it a high risk of complications and morbidity.  The differential diagnosis includes COPD exacerbation, CHF, pna, anemia, PTX -Co morbidities that complicate the patient evaluation includes COPD -Treatment includes solumedrol, duonebs, continuous nebs, mag -Reevaluation of the patient after these medicines showed that the patient improved -PCP office notes or outside notes reviewed -Discussion with specialist  [facility] Dr. Seena who agreees to admit pt -Escalation to admission/observation considered: patient agrees with admission.      Final diagnoses:  COPD exacerbation (HCC)  Acute respiratory failure with hypoxia [facility])    ED Discharge Orders     None          Nivia Colon, PA-C 03/12/24 1349  "

## 2024-03-13 ENCOUNTER — Inpatient Hospital Stay (HOSPITAL_COMMUNITY)

## 2024-03-13 DIAGNOSIS — Z7951 Long term (current) use of inhaled steroids: Secondary | ICD-10-CM | POA: Diagnosis not present

## 2024-03-13 DIAGNOSIS — I10 Essential (primary) hypertension: Secondary | ICD-10-CM | POA: Diagnosis present

## 2024-03-13 DIAGNOSIS — F411 Generalized anxiety disorder: Secondary | ICD-10-CM | POA: Diagnosis present

## 2024-03-13 DIAGNOSIS — K219 Gastro-esophageal reflux disease without esophagitis: Secondary | ICD-10-CM | POA: Diagnosis present

## 2024-03-13 DIAGNOSIS — Z8711 Personal history of peptic ulcer disease: Secondary | ICD-10-CM | POA: Diagnosis not present

## 2024-03-13 DIAGNOSIS — I700 Atherosclerosis of aorta: Secondary | ICD-10-CM | POA: Diagnosis present

## 2024-03-13 DIAGNOSIS — E669 Obesity, unspecified: Secondary | ICD-10-CM | POA: Diagnosis present

## 2024-03-13 DIAGNOSIS — Z8249 Family history of ischemic heart disease and other diseases of the circulatory system: Secondary | ICD-10-CM | POA: Diagnosis not present

## 2024-03-13 DIAGNOSIS — I447 Left bundle-branch block, unspecified: Secondary | ICD-10-CM | POA: Diagnosis present

## 2024-03-13 DIAGNOSIS — F17210 Nicotine dependence, cigarettes, uncomplicated: Secondary | ICD-10-CM | POA: Diagnosis present

## 2024-03-13 DIAGNOSIS — Z6824 Body mass index (BMI) 24.0-24.9, adult: Secondary | ICD-10-CM | POA: Diagnosis not present

## 2024-03-13 DIAGNOSIS — J9601 Acute respiratory failure with hypoxia: Secondary | ICD-10-CM | POA: Diagnosis present

## 2024-03-13 DIAGNOSIS — R9431 Abnormal electrocardiogram [ECG] [EKG]: Secondary | ICD-10-CM | POA: Diagnosis present

## 2024-03-13 DIAGNOSIS — J441 Chronic obstructive pulmonary disease with (acute) exacerbation: Secondary | ICD-10-CM | POA: Diagnosis present

## 2024-03-13 DIAGNOSIS — R7989 Other specified abnormal findings of blood chemistry: Secondary | ICD-10-CM | POA: Diagnosis present

## 2024-03-13 DIAGNOSIS — T380X5A Adverse effect of glucocorticoids and synthetic analogues, initial encounter: Secondary | ICD-10-CM | POA: Diagnosis present

## 2024-03-13 DIAGNOSIS — E782 Mixed hyperlipidemia: Secondary | ICD-10-CM | POA: Diagnosis present

## 2024-03-13 DIAGNOSIS — R7303 Prediabetes: Secondary | ICD-10-CM | POA: Diagnosis present

## 2024-03-13 LAB — COMPREHENSIVE METABOLIC PANEL WITH GFR
ALT: 12 U/L (ref 0–44)
AST: 27 U/L (ref 15–41)
Albumin: 3.9 g/dL (ref 3.5–5.0)
Alkaline Phosphatase: 84 U/L (ref 38–126)
Anion gap: 11 (ref 5–15)
BUN: 15 mg/dL (ref 8–23)
CO2: 24 mmol/L (ref 22–32)
Calcium: 9 mg/dL (ref 8.9–10.3)
Chloride: 101 mmol/L (ref 98–111)
Creatinine, Ser: 0.64 mg/dL (ref 0.44–1.00)
GFR, Estimated: >60 mL/min
Glucose, Bld: 171 mg/dL — ABNORMAL HIGH (ref 70–99)
Potassium: 3.8 mmol/L (ref 3.5–5.1)
Sodium: 137 mmol/L (ref 135–145)
Total Bilirubin: 0.5 mg/dL (ref 0.0–1.2)
Total Protein: 7.1 g/dL (ref 6.5–8.1)

## 2024-03-13 LAB — HEMOGLOBIN A1C
Hgb A1c MFr Bld: 5.7 % — ABNORMAL HIGH (ref 4.8–5.6)
Mean Plasma Glucose: 116.89 mg/dL

## 2024-03-13 LAB — CBC
HCT: 41 % (ref 36.0–46.0)
Hemoglobin: 13.6 g/dL (ref 12.0–15.0)
MCH: 31.5 pg (ref 26.0–34.0)
MCHC: 33.2 g/dL (ref 30.0–36.0)
MCV: 94.9 fL (ref 80.0–100.0)
Platelets: 308 K/uL (ref 150–400)
RBC: 4.32 MIL/uL (ref 3.87–5.11)
RDW: 11.8 % (ref 11.5–15.5)
WBC: 5.1 K/uL (ref 4.0–10.5)
nRBC: 0 % (ref 0.0–0.2)

## 2024-03-13 MED ORDER — KETOROLAC TROMETHAMINE 15 MG/ML IJ SOLN: 15.0000 mg | Freq: Four times a day (QID) | INTRAMUSCULAR | Status: DC | PRN

## 2024-03-13 MED ORDER — CARVEDILOL 12.5 MG PO TABS
12.5000 mg | ORAL_TABLET | Freq: Two times a day (BID) | ORAL | Status: DC
  Administered 2024-03-13 – 2024-03-15 (×4): 12.5 mg via ORAL
  Filled 2024-03-13 (×4): qty 1

## 2024-03-13 MED ORDER — IOHEXOL 350 MG/ML SOLN
75.0000 mL | Freq: Once | INTRAVENOUS | Status: AC | PRN
  Administered 2024-03-13: 75 mL via INTRAVENOUS

## 2024-03-13 MED ORDER — ALBUTEROL SULFATE (2.5 MG/3ML) 0.083% IN NEBU
2.5000 mg | INHALATION_SOLUTION | Freq: Two times a day (BID) | RESPIRATORY_TRACT | Status: DC
  Administered 2024-03-13 – 2024-03-15 (×4): 2.5 mg via RESPIRATORY_TRACT
  Filled 2024-03-13 (×4): qty 3

## 2024-03-13 MED ORDER — ARFORMOTEROL TARTRATE 15 MCG/2ML IN NEBU
15.0000 ug | INHALATION_SOLUTION | Freq: Two times a day (BID) | RESPIRATORY_TRACT | Status: DC
  Administered 2024-03-13 – 2024-03-15 (×4): 15 ug via RESPIRATORY_TRACT
  Filled 2024-03-13 (×4): qty 2

## 2024-03-13 MED ORDER — REVEFENACIN 175 MCG/3ML IN SOLN
175.0000 ug | Freq: Every day | RESPIRATORY_TRACT | Status: DC
  Administered 2024-03-14 – 2024-03-15 (×2): 175 ug via RESPIRATORY_TRACT
  Filled 2024-03-13 (×2): qty 3

## 2024-03-13 MED ORDER — ALBUTEROL SULFATE (2.5 MG/3ML) 0.083% IN NEBU
2.5000 mg | INHALATION_SOLUTION | RESPIRATORY_TRACT | Status: DC | PRN
  Administered 2024-03-15: 2.5 mg via RESPIRATORY_TRACT
  Filled 2024-03-13: qty 3

## 2024-03-13 MED ORDER — BUDESONIDE 0.25 MG/2ML IN SUSP
0.2500 mg | Freq: Two times a day (BID) | RESPIRATORY_TRACT | Status: DC
  Administered 2024-03-13 – 2024-03-15 (×4): 0.25 mg via RESPIRATORY_TRACT
  Filled 2024-03-13 (×4): qty 2

## 2024-03-13 NOTE — Progress Notes (Signed)
 " PROGRESS NOTE    Wendy Jensen  FMW:[id number] DOB: Jul 21, 1960 DOA: 03/12/2024 PCP: Tanda Bleacher, MD  Chief Complaint  Patient presents with   Respiratory Distress    Brief Narrative:    Wendy Jensen is [name] 64 y.o. female with medical history significant of hypertension, hyperlipidemia, GERD, reactive airway disease/presumed COPD, obesity, anxiety presenting with shortness of breath/respiratory distress.   She's been admitted with Ade Stmarie presumed COPD exacerbation.  Assessment & Plan:   Principal Problem:   Acute respiratory failure with hypoxia (HCC) Active Problems:   Essential hypertension   GERD   Generalized anxiety disorder   Pure hypercholesterolemia   Obesity  Acute respiratory with hypoxia Suspected COPD Exacerbation Long history of smoking, quit 4 years agao Currently on 3 L [place] CXR without active cardiopulmonary disease Wheezing on exam Will continue steroids, scheduled and prn nebs.  Needs controller medicine at discharge. Wean o2 as tolerated  Abnormal EKG Elevated Troponin Elevated Pro BNP EKG notably different than prior, sinus with normal rate, left axis deviation, incomplete left bundle branch block, T wave inversions in I, aVL.  Q waves V1, V2. Echo without regional wall motion abnormality.  Low normal EF, grade 1 diastolic dysfunction.  Repeat EKG pending. Consider CT PE protocol.  Not overloaded on exam   Hypertension Will resume coreg  at reduced dose Notes she was told to hold chlorthalidone  - BP currently appropriate, continue to hold    Hyperlipidemia Needs refills of statin  GERD Needs refill of PPI  Steroid Induced Hyperglycemia A1c    DVT prophylaxis: lovenox  Code Status: full Family Communication: none Disposition:   Status is: Inpatient Remains inpatient appropriate because: need for continued inpatient care   Consultants:  none  Procedures:  Echo IMPRESSIONS     1. Left ventricular ejection fraction, by  estimation, is 50 to 55%. The  left ventricle has low normal function. The left ventricle has no regional  wall motion abnormalities. There is moderate left ventricular hypertrophy  of the basal-septal segment.  Left ventricular diastolic parameters are consistent with Grade I  diastolic dysfunction (impaired relaxation).   2. Right ventricular systolic function is normal. The right ventricular  size is normal.   3. The mitral valve is normal in structure. No evidence of mitral valve  regurgitation. No evidence of mitral stenosis.   4. The aortic valve is tricuspid. Aortic valve regurgitation is not  visualized. No aortic stenosis is present.   Antimicrobials:  Anti-infectives (From admission, onward)    None       Subjective: Feels about 50% Still SOB   Objective: Vitals:   03/13/24 0604 03/13/24 0607 03/13/24 0759 03/13/24 1311  BP:  130/75 112/62 100/65  Pulse:  69 78 83  Resp:  20 13 14   Temp:  98.4 F (36.9 C) 98.1 F (36.7 C) 98.1 F (36.7 C)  TempSrc:  Oral Oral Oral  SpO2:  94% 96% 97%  Weight: 62 kg     Height: 5' 5 (1.651 m)       Intake/Output Summary (Last 24 hours) at 03/13/2024 1348 Last data filed at 03/13/2024 0800 Gross per 24 hour  Intake 240 ml  Output --  Net 240 ml   Filed Weights   03/13/24 0604  Weight: 62 kg    Examination:  General exam: Appears calm and comfortable  Respiratory system: scattered expiratory wheezes, diminished - on 2 L [place] Cardiovascular system: RRR Central nervous system: Alert and oriented. No focal neurological deficits. Extremities: no LEE  Data Reviewed: I have personally reviewed following labs and imaging studies  CBC: Recent Labs  Lab 03/10/24 1223 03/11/24 0520 03/12/24 1151 03/12/24 1213 03/13/24 0100  WBC 11.1* 9.7 11.8*  --  5.1  NEUTROABS 6.4  --  7.3  --   --   HGB 15.6* 13.9 13.8 14.3 13.6  HCT 48.2* 42.7 43.1 42.0 41.0  MCV 96.4 95.3 97.1  --  94.9  PLT 370 325 327  --  308    Basic  Metabolic Panel: Recent Labs  Lab 03/10/24 1223 03/11/24 0520 03/12/24 1151 03/12/24 1213 03/13/24 0100  NA 143 140 135 136 137  K 3.9 4.2 3.9 4.2 3.8  CL 104 104 101  --  101  CO2 24 26 23   --  24  GLUCOSE 151* 122* 267*  --  171*  BUN 13 15 16   --  15  CREATININE 0.72 0.69 0.69  --  0.64  CALCIUM  9.7 9.7 8.8*  --  9.0    GFR: Estimated Creatinine Clearance: 65.6 mL/min (by C-G formula based on SCr of 0.64 mg/dL).  Liver Function Tests: Recent Labs  Lab 03/10/24 1223 03/11/24 0520 03/13/24 0100  AST 27 32 27  ALT 12 12 12   ALKPHOS 116 96 84  BILITOT 0.9 0.6 0.5  PROT 8.5* 7.7 7.1  ALBUMIN 4.6 4.2 3.9    CBG: No results for input(s): GLUCAP in the last 168 hours.   Recent Results (from the past 240 hours)  Respiratory (~20 pathogens) panel by PCR     Status: None   Collection Time: 03/12/24  1:34 PM   Specimen: Nasopharyngeal Swab; Respiratory  Result Value Ref Range Status   Adenovirus NOT DETECTED NOT DETECTED Final   Coronavirus 229E NOT DETECTED NOT DETECTED Final    Comment: (NOTE) The Coronavirus on the Respiratory Panel, DOES NOT test for the novel  Coronavirus (2019 nCoV)    Coronavirus HKU1 NOT DETECTED NOT DETECTED Final   Coronavirus NL63 NOT DETECTED NOT DETECTED Final   Coronavirus OC43 NOT DETECTED NOT DETECTED Final   Metapneumovirus NOT DETECTED NOT DETECTED Final   Rhinovirus / Enterovirus NOT DETECTED NOT DETECTED Final   Influenza Aviyanna Colbaugh NOT DETECTED NOT DETECTED Final   Influenza B NOT DETECTED NOT DETECTED Final   Parainfluenza Virus 1 NOT DETECTED NOT DETECTED Final   Parainfluenza Virus 2 NOT DETECTED NOT DETECTED Final   Parainfluenza Virus 3 NOT DETECTED NOT DETECTED Final   Parainfluenza Virus 4 NOT DETECTED NOT DETECTED Final   Respiratory Syncytial Virus NOT DETECTED NOT DETECTED Final   Bordetella pertussis NOT DETECTED NOT DETECTED Final   Bordetella Parapertussis NOT DETECTED NOT DETECTED Final   Chlamydophila pneumoniae NOT  DETECTED NOT DETECTED Final   Mycoplasma pneumoniae NOT DETECTED NOT DETECTED Final    Comment: Performed at [facility], [address] [place], [place] [address]         Radiology Studies: ECHOCARDIOGRAM COMPLETE Result Date: 03/12/2024    ECHOCARDIOGRAM REPORT   Patient Name:   ZHOEY BLACKSTOCK Date of Exam: 03/12/2024 Medical Rec #:  [id number]           Height:       65.0 in Accession #:    [id number]          Weight:       150.0 lb Date of Birth:  [birth year]           BSA:          1.750  m Patient Age:    63 years            BP:           115/69 mmHg Patient Gender: F                   HR:           60 bpm. Exam Location:  Inpatient Procedure: 2D Echo, Cardiac Doppler and Color Doppler (Both Spectral and Color            Flow Doppler were utilized during procedure). Indications:    Dyspnea R06.00  History:        Patient has prior history of Echocardiogram examinations, most                 recent 10/26/2021. COPD.  Sonographer:    Jayson Gaskins Referring Phys: [id number] MARSA NOVAK MELVIN IMPRESSIONS  1. Left ventricular ejection fraction, by estimation, is 50 to 55%. The left ventricle has low normal function. The left ventricle has no regional wall motion abnormalities. There is moderate left ventricular hypertrophy of the basal-septal segment. Left ventricular diastolic parameters are consistent with Grade I diastolic dysfunction (impaired relaxation).  2. Right ventricular systolic function is normal. The right ventricular size is normal.  3. The mitral valve is normal in structure. No evidence of mitral valve regurgitation. No evidence of mitral stenosis.  4. The aortic valve is tricuspid. Aortic valve regurgitation is not visualized. No aortic stenosis is present. FINDINGS  Left Ventricle: Left ventricular ejection fraction, by estimation, is 50 to 55%. The left ventricle has low normal function. The left ventricle has no regional wall motion abnormalities. The left ventricular  internal cavity size was normal in size. There is moderate left ventricular hypertrophy of the basal-septal segment. Left ventricular diastolic parameters are consistent with Grade I diastolic dysfunction (impaired relaxation). Right Ventricle: The right ventricular size is normal. Right ventricular systolic function is normal. Left Atrium: Left atrial size was normal in size. Right Atrium: Right atrial size was normal in size. Pericardium: There is no evidence of pericardial effusion. Mitral Valve: The mitral valve is normal in structure. No evidence of mitral valve regurgitation. No evidence of mitral valve stenosis. Tricuspid Valve: The tricuspid valve is normal in structure. Tricuspid valve regurgitation is trivial. No evidence of tricuspid stenosis. Aortic Valve: The aortic valve is tricuspid. Aortic valve regurgitation is not visualized. No aortic stenosis is present. Aortic valve mean gradient measures 3.0 mmHg. Aortic valve peak gradient measures 4.2 mmHg. Aortic valve area, by VTI measures 2.93 cm. Pulmonic Valve: The pulmonic valve was not well visualized. Pulmonic valve regurgitation is not visualized. No evidence of pulmonic stenosis. Aorta: The aortic root is normal in size and structure. Venous: The inferior vena cava was not well visualized. IAS/Shunts: The interatrial septum was not well visualized.  LEFT VENTRICLE PLAX 2D LVIDd:         4.30 cm   Diastology LVIDs:         2.70 cm   LV e' medial:    6.20 cm/s LV PW:         1.00 cm   LV E/e' medial:  7.1 LV IVS:        1.20 cm   LV e' lateral:   5.11 cm/s LVOT diam:     1.96 cm   LV E/e' lateral: 8.6 LV SV:         63 LV SV Index:   36 LVOT  Area:     3.02 cm  RIGHT VENTRICLE RV S prime:     11.00 cm/s TAPSE (M-mode): 2.1 cm LEFT ATRIUM             Index        RIGHT ATRIUM           Index LA Vol (A2C):   41.1 ml 23.48 ml/m  RA Area:     13.50 cm LA Vol (A4C):   19.5 ml 11.14 ml/m  RA Volume:   31.40 ml  17.94 ml/m LA Biplane Vol: 28.8 ml 16.45  ml/m  AORTIC VALVE AV Area (Vmax):    2.90 cm AV Area (Vmean):   2.38 cm AV Area (VTI):     2.93 cm AV Vmax:           103.00 cm/s AV Vmean:          87.200 cm/s AV VTI:            0.216 m AV Peak Grad:      4.2 mmHg AV Mean Grad:      3.0 mmHg LVOT Vmax:         99.00 cm/s LVOT Vmean:        68.900 cm/s LVOT VTI:          0.210 m LVOT/AV VTI ratio: 0.97 MITRAL VALVE MV Area (PHT): 2.27 cm    SHUNTS MV Decel Time: 334 msec    Systemic VTI:  0.21 m MV E velocity: 44.00 cm/s  Systemic Diam: 1.96 cm MV Orley Lawry velocity: 88.60 cm/s MV E/Racheal Mathurin ratio:  0.50 Redell Shallow MD Electronically signed by Redell Shallow MD Signature Date/Time: 03/12/2024/3:34:30 PM    Final    DG Chest Port 1 View Result Date: 03/12/2024 EXAM: 1 VIEW(S) XRAY OF THE CHEST 03/12/2024 12:14:50 PM COMPARISON: 03/10/2024 CLINICAL HISTORY: sob sob FINDINGS: LUNGS AND PLEURA: No focal pulmonary opacity. No pleural effusion. No pneumothorax. HEART AND MEDIASTINUM: Aortic atherosclerosis. No acute abnormality of the cardiac and mediastinal silhouettes. BONES AND SOFT TISSUES: No acute osseous abnormality. IMPRESSION: 1. No active cardiopulmonary disease. Electronically signed by: Franky Crease MD 03/12/2024 12:57 PM EST RP Workstation: [id number]        Scheduled Meds:  albuterol   2.5 mg Nebulization BID   arformoterol  15 mcg Nebulization BID   budesonide (PULMICORT) nebulizer solution  0.25 mg Nebulization BID   carvedilol   12.5 mg Oral BID WC   enoxaparin  (LOVENOX ) injection  40 mg Subcutaneous Q24H   predniSONE   40 mg Oral Q breakfast   revefenacin  175 mcg Nebulization Daily   sodium chloride  flush  3 mL Intravenous Q12H   Continuous Infusions:   LOS: 0 days    Time spent: over 30 min     Meliton Monte, MD [facility]   To contact the attending provider between 7A-7P or the covering provider during after hours 7P-7A, please log into the web site [email or web address] and access using universal Bernard password for  that web site. If you do not have the password, please call the hospital operator.  03/13/2024, 1:48 PM    "

## 2024-03-13 NOTE — Plan of Care (Signed)
  Problem: Clinical Measurements: Goal: Will remain free from infection Outcome: Progressing   Problem: Nutrition: Goal: Adequate nutrition will be maintained Outcome: Progressing   Problem: Coping: Goal: Level of anxiety will decrease Outcome: Progressing   Problem: Skin Integrity: Goal: Risk for impaired skin integrity will decrease Outcome: Progressing   

## 2024-03-14 ENCOUNTER — Telehealth: Payer: Self-pay

## 2024-03-14 DIAGNOSIS — J9601 Acute respiratory failure with hypoxia: Secondary | ICD-10-CM | POA: Diagnosis not present

## 2024-03-14 LAB — CBC WITH DIFFERENTIAL/PLATELET
Abs Immature Granulocytes: 0.02 K/uL (ref 0.00–0.07)
Basophils Absolute: 0 K/uL (ref 0.0–0.1)
Basophils Relative: 0 %
Eosinophils Absolute: 0 K/uL (ref 0.0–0.5)
Eosinophils Relative: 1 %
HCT: 38.9 % (ref 36.0–46.0)
Hemoglobin: 12.7 g/dL (ref 12.0–15.0)
Immature Granulocytes: 0 %
Lymphocytes Relative: 29 %
Lymphs Abs: 2.5 K/uL (ref 0.7–4.0)
MCH: 30.8 pg (ref 26.0–34.0)
MCHC: 32.6 g/dL (ref 30.0–36.0)
MCV: 94.4 fL (ref 80.0–100.0)
Monocytes Absolute: 0.6 K/uL (ref 0.1–1.0)
Monocytes Relative: 7 %
Neutro Abs: 5.4 K/uL (ref 1.7–7.7)
Neutrophils Relative %: 63 %
Platelets: 290 K/uL (ref 150–400)
RBC: 4.12 MIL/uL (ref 3.87–5.11)
RDW: 11.9 % (ref 11.5–15.5)
WBC: 8.6 K/uL (ref 4.0–10.5)
nRBC: 0 % (ref 0.0–0.2)

## 2024-03-14 LAB — COMPREHENSIVE METABOLIC PANEL WITH GFR
ALT: 16 U/L (ref 0–44)
AST: 22 U/L (ref 15–41)
Albumin: 3.6 g/dL (ref 3.5–5.0)
Alkaline Phosphatase: 71 U/L (ref 38–126)
Anion gap: 7 (ref 5–15)
BUN: 17 mg/dL (ref 8–23)
CO2: 31 mmol/L (ref 22–32)
Calcium: 9 mg/dL (ref 8.9–10.3)
Chloride: 102 mmol/L (ref 98–111)
Creatinine, Ser: 0.76 mg/dL (ref 0.44–1.00)
GFR, Estimated: >60 mL/min
Glucose, Bld: 86 mg/dL (ref 70–99)
Potassium: 3.8 mmol/L (ref 3.5–5.1)
Sodium: 140 mmol/L (ref 135–145)
Total Bilirubin: 0.6 mg/dL (ref 0.0–1.2)
Total Protein: 6.6 g/dL (ref 6.5–8.1)

## 2024-03-14 LAB — MAGNESIUM: Magnesium: 2.2 mg/dL (ref 1.7–2.4)

## 2024-03-14 LAB — PHOSPHORUS: Phosphorus: 4.7 mg/dL — ABNORMAL HIGH (ref 2.5–4.6)

## 2024-03-14 MED ORDER — FAMOTIDINE 20 MG PO TABS
20.0000 mg | ORAL_TABLET | Freq: Two times a day (BID) | ORAL | Status: DC
  Administered 2024-03-14 – 2024-03-15 (×3): 20 mg via ORAL
  Filled 2024-03-14 (×3): qty 1

## 2024-03-14 NOTE — Telephone Encounter (Signed)
 Transition Care Management Unsuccessful Follow-up Telephone Call  Date of discharge and from where:  [facility] 03/11/24  Attempts:  2nd Attempt  Reason for unsuccessful TCM follow-up call:  Missing or invalid number

## 2024-03-14 NOTE — Plan of Care (Signed)

## 2024-03-14 NOTE — Progress Notes (Signed)
 " PROGRESS NOTE    Wendy Jensen  FMW:[id number] DOB: Jul 10, 1960 DOA: 03/12/2024 PCP: Tanda Bleacher, MD  Chief Complaint  Patient presents with   Respiratory Distress    Brief Narrative:    Jim Philemon is Wendy Jensen 64 y.o. female with medical history significant of hypertension, hyperlipidemia, GERD, reactive airway disease/presumed COPD, obesity, anxiety presenting with shortness of breath/respiratory distress.   She's been admitted with Morene Cecilio presumed COPD exacerbation.  Assessment & Plan:   Principal Problem:   Acute respiratory failure with hypoxia (HCC) Active Problems:   Essential hypertension   GERD   Generalized anxiety disorder   Pure hypercholesterolemia   Obesity  Acute respiratory with hypoxia Suspected COPD Exacerbation Long history of smoking, quit 4 years agao CT with diffuse bronchial wall thickening with bilateral lower lobe mucus plugging suggestive of small airway disease in the setting of emphysema/COPD.   Currently on 2 L [place] CXR without active cardiopulmonary disease Wheezing on exam Jensen continue steroids, scheduled and prn nebs.  Needs controller medicine at discharge. Wean o2 as tolerated  Abnormal EKG Elevated Troponin Elevated Pro BNP EKG notably different than prior, sinus with normal rate, left axis deviation, incomplete left bundle branch block, T wave inversions in I, aVL.  Q waves V1, V2. Echo without regional wall motion abnormality.  Low normal EF, grade 1 diastolic dysfunction.  Repeat EKG sinus, normal QRS, T wave inversion in I and aVL, V1, V2. CT PE protocol without PE Not overloaded on exam   Hypertension Jensen resume coreg  at reduced dose Notes she was told to hold chlorthalidone  - BP currently appropriate, continue to hold    Hyperlipidemia Needs refills of statin  GERD Needs refill of PPI  Prediabetes Steroid Induced Hyperglycemia A1c 5.7  Needs to d/c home with meds from [facility], last admission wasn't able to pick up  meds for some reason    DVT prophylaxis: lovenox  Code Status: full Family Communication: none Disposition:   Status is: Inpatient Remains inpatient appropriate because: need for continued inpatient care   Consultants:  none  Procedures:  Echo IMPRESSIONS     1. Left ventricular ejection fraction, by estimation, is 50 to 55%. The  left ventricle has low normal function. The left ventricle has no regional  wall motion abnormalities. There is moderate left ventricular hypertrophy  of the basal-septal segment.  Left ventricular diastolic parameters are consistent with Grade I  diastolic dysfunction (impaired relaxation).   2. Right ventricular systolic function is normal. The right ventricular  size is normal.   3. The mitral valve is normal in structure. No evidence of mitral valve  regurgitation. No evidence of mitral stenosis.   4. The aortic valve is tricuspid. Aortic valve regurgitation is not  visualized. No aortic stenosis is present.   Antimicrobials:  Anti-infectives (From admission, onward)    None       Subjective: Still doesn't feel herself Chest feels tight, feels SOB  Objective: Vitals:   03/14/24 0346 03/14/24 0645 03/14/24 0745 03/14/24 0826  BP: 133/77   110/65  Pulse: 72 81  69  Resp: 15 (!) 34  17  Temp: 97.6 F (36.4 C)   98.9 F (37.2 C)  TempSrc: Oral   Oral  SpO2: 96%  99% 96%  Weight:  62.3 kg    Height:       No intake or output data in the 24 hours ending 03/14/24 0954  Filed Weights   03/13/24 0604 03/14/24 0645  Weight: 62 kg 62.3 kg  Examination:  General: No acute distress. Cardiovascular: RRR Lungs: unlabored on 2 L, wheezing today most notable on anterior exam on L, posterior exam diminished, but wheezing improved Neurological: Alert and oriented 3. Moves all extremities 4 with equal strength. Cranial nerves II through XII grossly intact. Extremities: No clubbing or cyanosis. No edema.   Data Reviewed: I have  personally reviewed following labs and imaging studies  CBC: Recent Labs  Lab 03/10/24 1223 03/11/24 0520 03/12/24 1151 03/12/24 1213 03/13/24 0100 03/14/24 0300  WBC 11.1* 9.7 11.8*  --  5.1 8.6  NEUTROABS 6.4  --  7.3  --   --  5.4  HGB 15.6* 13.9 13.8 14.3 13.6 12.7  HCT 48.2* 42.7 43.1 42.0 41.0 38.9  MCV 96.4 95.3 97.1  --  94.9 94.4  PLT 370 325 327  --  308 290    Basic Metabolic Panel: Recent Labs  Lab 03/10/24 1223 03/11/24 0520 03/12/24 1151 03/12/24 1213 03/13/24 0100 03/14/24 0300  NA 143 140 135 136 137 140  K 3.9 4.2 3.9 4.2 3.8 3.8  CL 104 104 101  --  101 102  CO2 24 26 23   --  24 31  GLUCOSE 151* 122* 267*  --  171* 86  BUN 13 15 16   --  15 17  CREATININE 0.72 0.69 0.69  --  0.64 0.76  CALCIUM  9.7 9.7 8.8*  --  9.0 9.0  MG  --   --   --   --   --  2.2  PHOS  --   --   --   --   --  4.7*    GFR: Estimated Creatinine Clearance: 65.6 mL/min (by C-G formula based on SCr of 0.76 mg/dL).  Liver Function Tests: Recent Labs  Lab 03/10/24 1223 03/11/24 0520 03/13/24 0100 03/14/24 0300  AST 27 32 27 22  ALT 12 12 12 16   ALKPHOS 116 96 84 71  BILITOT 0.9 0.6 0.5 0.6  PROT 8.5* 7.7 7.1 6.6  ALBUMIN 4.6 4.2 3.9 3.6    CBG: No results for input(s): GLUCAP in the last 168 hours.   Recent Results (from the past 240 hours)  Respiratory (~20 pathogens) panel by PCR     Status: None   Collection Time: 03/12/24  1:34 PM   Specimen: Nasopharyngeal Swab; Respiratory  Result Value Ref Range Status   Adenovirus NOT DETECTED NOT DETECTED Final   Coronavirus 229E NOT DETECTED NOT DETECTED Final    Comment: (NOTE) The Coronavirus on the Respiratory Panel, DOES NOT test for the novel  Coronavirus (2019 nCoV)    Coronavirus HKU1 NOT DETECTED NOT DETECTED Final   Coronavirus NL63 NOT DETECTED NOT DETECTED Final   Coronavirus OC43 NOT DETECTED NOT DETECTED Final   Metapneumovirus NOT DETECTED NOT DETECTED Final   Rhinovirus / Enterovirus NOT DETECTED  NOT DETECTED Final   Influenza Joslin Doell NOT DETECTED NOT DETECTED Final   Influenza B NOT DETECTED NOT DETECTED Final   Parainfluenza Virus 1 NOT DETECTED NOT DETECTED Final   Parainfluenza Virus 2 NOT DETECTED NOT DETECTED Final   Parainfluenza Virus 3 NOT DETECTED NOT DETECTED Final   Parainfluenza Virus 4 NOT DETECTED NOT DETECTED Final   Respiratory Syncytial Virus NOT DETECTED NOT DETECTED Final   Bordetella pertussis NOT DETECTED NOT DETECTED Final   Bordetella Parapertussis NOT DETECTED NOT DETECTED Final   Chlamydophila pneumoniae NOT DETECTED NOT DETECTED Final   Mycoplasma pneumoniae NOT DETECTED NOT DETECTED Final    Comment: Performed  at [facility], [address] [place], [place] [address]         Radiology Studies: CT Angio Chest Pulmonary Embolism (PE) W or WO Contrast Addendum Date: 03/13/2024 * ADDENDUM #1 * ADDENDUM: Diffuse bronchial wall thickening with bilateral lower lobe mucus plugging suggestive of small airway disease in the setting of emphysema/COPD. ---------------------------------------------------- Electronically signed by: Kate Plummer MD 03/13/2024 05:21 PM EST RP Workstation: [id number]   Result Date: 03/13/2024 * ORIGINAL REPORT * EXAM: CTA CHEST 03/13/2024 04:36:00 PM TECHNIQUE: CTA of the chest was performed without and with the administration of 75 mL of iohexol (OMNIPAQUE) 350 MG/ML intravenous contrast. Multiplanar reformatted images are provided for review. MIP images are provided for review. Automated exposure control, iterative reconstruction, and/or weight based adjustment of the mA/kV was utilized to reduce the radiation dose to as low as reasonably achievable. COMPARISON: None available. CLINICAL HISTORY: shortness of breath FINDINGS: PULMONARY ARTERIES: Pulmonary arteries are adequately opacified for evaluation. No acute pulmonary embolus. Main pulmonary artery is normal in caliber. MEDIASTINUM: 4-vessel coronary artery calcifications.  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 LYMPH NODES: No mediastinal, hilar or axillary lymphadenopathy. LUNGS AND PLEURA: Moderate centrilobular emphysematous changes. Right base atelectasis. No focal consolidation or pulmonary edema. No evidence of pleural effusion or pneumothorax. UPPER ABDOMEN: The bone-density lesion of the left kidney likely represents Xianna Siverling simple renal cyst. Simple renal cysts do not require additional follow-up unless clinically indicated due to signs/symptoms. Tiny hiatal hernia. SOFT TISSUES AND BONES: No acute bone or soft tissue abnormality. IMPRESSION: 1. No pulmonary embolism. 2. Emphysema. 3. Atherosclerotic plaque including 4-vessel coronary artery calcifications. Electronically signed by: Morgane Naveau MD 03/13/2024 05:13 PM EST RP Workstation: [id number]   ECHOCARDIOGRAM COMPLETE Result Date: 03/12/2024    ECHOCARDIOGRAM REPORT   Patient Name:   Wendy Jensen Date of Exam: 03/12/2024 Medical Rec #:  [id number]           Height:       65.0 in Accession #:    [id number]          Weight:       150.0 lb Date of Birth:  05-Jun-1960           BSA:          1.750 m Patient Age:    63 years            BP:           115/69 mmHg Patient Gender: F                   HR:           60 bpm. Exam Location:  Inpatient Procedure: 2D Echo, Cardiac Doppler and Color Doppler (Both Spectral and Color            Flow Doppler were utilized during procedure). Indications:    Dyspnea R06.00  History:        Patient has prior history of Echocardiogram examinations, most                 recent 10/26/2021. COPD.  Sonographer:    Jayson Gaskins Referring Phys: [id number] MARSA NOVAK MELVIN IMPRESSIONS  1. Left ventricular ejection fraction, by estimation, is 50 to 55%. The left ventricle has low normal function. The left ventricle has no regional wall motion abnormalities. There is moderate left ventricular hypertrophy of the  basal-septal segment. Left ventricular diastolic parameters are consistent  with Grade I diastolic dysfunction (impaired relaxation).  2. Right ventricular systolic function is normal. The right ventricular size is normal.  3. The mitral valve is normal in structure. No evidence of mitral valve regurgitation. No evidence of mitral stenosis.  4. The aortic valve is tricuspid. Aortic valve regurgitation is not visualized. No aortic stenosis is present. FINDINGS  Left Ventricle: Left ventricular ejection fraction, by estimation, is 50 to 55%. The left ventricle has low normal function. The left ventricle has no regional wall motion abnormalities. The left ventricular internal cavity size was normal in size. There is moderate left ventricular hypertrophy of the basal-septal segment. Left ventricular diastolic parameters are consistent with Grade I diastolic dysfunction (impaired relaxation). Right Ventricle: The right ventricular size is normal. Right ventricular systolic function is normal. Left Atrium: Left atrial size was normal in size. Right Atrium: Right atrial size was normal in size. Pericardium: There is no evidence of pericardial effusion. Mitral Valve: The mitral valve is normal in structure. No evidence of mitral valve regurgitation. No evidence of mitral valve stenosis. Tricuspid Valve: The tricuspid valve is normal in structure. Tricuspid valve regurgitation is trivial. No evidence of tricuspid stenosis. Aortic Valve: The aortic valve is tricuspid. Aortic valve regurgitation is not visualized. No aortic stenosis is present. Aortic valve mean gradient measures 3.0 mmHg. Aortic valve peak gradient measures 4.2 mmHg. Aortic valve area, by VTI measures 2.93 cm. Pulmonic Valve: The pulmonic valve was not well visualized. Pulmonic valve regurgitation is not visualized. No evidence of pulmonic stenosis. Aorta: The aortic root is normal in size and structure. Venous: The inferior vena cava was not well visualized.  IAS/Shunts: The interatrial septum was not well visualized.  LEFT VENTRICLE PLAX 2D LVIDd:         4.30 cm   Diastology LVIDs:         2.70 cm   LV e' medial:    6.20 cm/s LV PW:         1.00 cm   LV E/e' medial:  7.1 LV IVS:        1.20 cm   LV e' lateral:   5.11 cm/s LVOT diam:     1.96 cm   LV E/e' lateral: 8.6 LV SV:         63 LV SV Index:   36 LVOT Area:     3.02 cm  RIGHT VENTRICLE RV S prime:     11.00 cm/s TAPSE (M-mode): 2.1 cm LEFT ATRIUM             Index        RIGHT ATRIUM           Index LA Vol (A2C):   41.1 ml 23.48 ml/m  RA Area:     13.50 cm LA Vol (A4C):   19.5 ml 11.14 ml/m  RA Volume:   31.40 ml  17.94 ml/m LA Biplane Vol: 28.8 ml 16.45 ml/m  AORTIC VALVE AV Area (Vmax):    2.90 cm AV Area (Vmean):   2.38 cm AV Area (VTI):     2.93 cm AV Vmax:           103.00 cm/s AV Vmean:          87.200 cm/s AV VTI:            0.216 m AV Peak Grad:      4.2 mmHg AV Mean Grad:      3.0 mmHg LVOT Vmax:  99.00 cm/s LVOT Vmean:        68.900 cm/s LVOT VTI:          0.210 m LVOT/AV VTI ratio: 0.97 MITRAL VALVE MV Area (PHT): 2.27 cm    SHUNTS MV Decel Time: 334 msec    Systemic VTI:  0.21 m MV E velocity: 44.00 cm/s  Systemic Diam: 1.96 cm MV Aliyanna Wassmer velocity: 88.60 cm/s MV E/Stacy Sailer ratio:  0.50 Redell Shallow MD Electronically signed by Redell Shallow MD Signature Date/Time: 03/12/2024/3:34:30 PM    Final    DG Chest Port 1 View Result Date: 03/12/2024 EXAM: 1 VIEW(S) XRAY OF THE CHEST 03/12/2024 12:14:50 PM COMPARISON: 03/10/2024 CLINICAL HISTORY: sob sob FINDINGS: LUNGS AND PLEURA: No focal pulmonary opacity. No pleural effusion. No pneumothorax. HEART AND MEDIASTINUM: Aortic atherosclerosis. No acute abnormality of the cardiac and mediastinal silhouettes. BONES AND SOFT TISSUES: No acute osseous abnormality. IMPRESSION: 1. No active cardiopulmonary disease. Electronically signed by: Franky Crease MD 03/12/2024 12:57 PM EST RP Workstation: [id number]        Scheduled Meds:  albuterol   2.5 mg  Nebulization BID   arformoterol  15 mcg Nebulization BID   budesonide (PULMICORT) nebulizer solution  0.25 mg Nebulization BID   carvedilol   12.5 mg Oral BID WC   enoxaparin  (LOVENOX ) injection  40 mg Subcutaneous Q24H   predniSONE   40 mg Oral Q breakfast   revefenacin  175 mcg Nebulization Daily   sodium chloride  flush  3 mL Intravenous Q12H   Continuous Infusions:   LOS: 1 day    Time spent: over 30 min     Meliton Monte, MD [facility]   To contact the attending provider between 7A-7P or the covering provider during after hours 7P-7A, please log into the web site [email or web address] and access using universal [place] password for that web site. If you do not have the password, please call the hospital operator.  03/14/2024, 9:54 AM    "

## 2024-03-15 ENCOUNTER — Other Ambulatory Visit (HOSPITAL_COMMUNITY): Payer: Self-pay

## 2024-03-15 DIAGNOSIS — J9601 Acute respiratory failure with hypoxia: Secondary | ICD-10-CM | POA: Diagnosis not present

## 2024-03-15 MED ORDER — PREDNISONE 20 MG PO TABS
ORAL_TABLET | ORAL | 0 refills | Status: AC
  Filled 2024-03-15: qty 6, 6d supply, fill #0

## 2024-03-15 MED ORDER — ALBUTEROL SULFATE HFA 108 (90 BASE) MCG/ACT IN AERS
2.0000 | INHALATION_SPRAY | RESPIRATORY_TRACT | 0 refills | Status: DC | PRN
  Filled 2024-03-15 (×2): qty 6.7, 25d supply, fill #0

## 2024-03-15 MED ORDER — CARVEDILOL 6.25 MG PO TABS
6.2500 mg | ORAL_TABLET | Freq: Two times a day (BID) | ORAL | 0 refills | Status: DC
  Filled 2024-03-15: qty 60, 30d supply, fill #0

## 2024-03-15 MED ORDER — ROSUVASTATIN CALCIUM 20 MG PO TABS
20.0000 mg | ORAL_TABLET | Freq: Every day | ORAL | 0 refills | Status: AC
  Filled 2024-03-15: qty 30, 30d supply, fill #0
  Filled 2024-03-15: qty 90, 90d supply, fill #0

## 2024-03-15 MED ORDER — DOXYCYCLINE HYCLATE 100 MG PO TABS
100.0000 mg | ORAL_TABLET | Freq: Two times a day (BID) | ORAL | 0 refills | Status: AC
  Filled 2024-03-15: qty 10, 5d supply, fill #0

## 2024-03-15 MED ORDER — BREZTRI AEROSPHERE 160-9-4.8 MCG/ACT IN AERO
2.0000 | INHALATION_SPRAY | Freq: Two times a day (BID) | RESPIRATORY_TRACT | 0 refills | Status: DC
  Filled 2024-03-15: qty 10.7, 30d supply, fill #0

## 2024-03-15 NOTE — Progress Notes (Signed)
SATURATION QUALIFICATIONS: (This note is used to comply with regulatory documentation for home oxygen)  Patient Saturations on Room Air at Rest = 91%  Patient Saturations on Room Air while Ambulating = 84%  Patient Saturations on 2 Liters of oxygen while Ambulating = 94%  Please briefly explain why patient needs home oxygen: 

## 2024-03-15 NOTE — Plan of Care (Signed)
   Problem: Clinical Measurements: Goal: Ability to maintain clinical measurements within normal limits will improve Outcome: Progressing Goal: Cardiovascular complication will be avoided Outcome: Progressing

## 2024-03-15 NOTE — Progress Notes (Signed)
 DISCHARGE NOTE HOME Wendy Jensen to be discharged Home per MD order. Discussed prescriptions and follow up appointments with the patient. Prescriptions given to patient; medication list explained in detail. Patient verbalized understanding.  Skin clean, dry and intact without evidence of skin break down, no evidence of skin tears noted. IV catheter discontinued intact. Site without signs and symptoms of complications. Dressing and pressure applied. Pt denies pain at the site currently. No complaints noted.  Patient free of lines, drains, and wounds.   An After Visit Summary (AVS) was printed and given to the patient. Patient escorted via wheelchair, and discharged home via private auto.  Peyton SHAUNNA Pepper, RN

## 2024-03-15 NOTE — Discharge Summary (Signed)
 Physician Discharge Summary  Jillian Mckinny FMW:[id number] DOB: July 03, 1960 DOA: 03/12/2024  PCP: Tanda Bleacher, MD  Admit date: 03/12/2024 Discharge date: 03/15/2024  Time spent: 40 minutes  Recommendations for Outpatient Follow-up:  Follow outpatient CBC/CMP  Follow with pulmonary outpatient - needs PFT's Follow blood pressure outpatient, heart rate - coreg  dose reduced   Discharge Diagnoses:  Principal Problem:   Acute respiratory failure with hypoxia (HCC) Active Problems:   Essential hypertension   GERD   Generalized anxiety disorder   Pure hypercholesterolemia   Obesity   Discharge Condition: stable  Diet recommendation: heart healthy  Filed Weights   03/13/24 0604 03/14/24 0645 03/15/24 0615  Weight: 62 kg 62.3 kg 64.2 kg    History of present illness:   Wendy Jensen is Wendy Jensen 64 y.o. female with medical history significant of hypertension, hyperlipidemia, GERD, reactive airway disease/presumed COPD, obesity, anxiety presenting with shortness of breath/respiratory distress.    She's been admitted with Dinesh Ulysse presumed COPD exacerbation.  Improved with steroids, inhalers.  Discharged on 1/3 with supplemental oxygen with activity.  Hospital Course:  Assessment and Plan:  Acute respiratory with hypoxia Suspected COPD Exacerbation Long history of smoking, quit 4 years agao CT with diffuse bronchial wall thickening with bilateral lower lobe mucus plugging suggestive of small airway disease in the setting of emphysema/COPD.   Requiring 2 L with activity, will discharge home with oxygen CXR without active cardiopulmonary disease Wheezing on exam Will continue steroids, scheduled and prn nebs.  Discharged with albuterol  and breztri .  Outpatient pulm follow up.   Abnormal EKG Elevated Troponin Elevated Pro BNP EKG notably different than prior, sinus with normal rate, left axis deviation, incomplete left bundle branch block, T wave inversions in I, aVL.  Q waves V1,  V2. Echo without regional wall motion abnormality.  Low normal EF, grade 1 diastolic dysfunction.  Repeat EKG sinus, normal QRS, T wave inversion in I and aVL, V1, V2. CT PE protocol without PE Not overloaded on exam   Hypertension Reduce dose of coreg  give brady    Hyperlipidemia Needs refills of statin   GERD Needs refill of PPI   Prediabetes Steroid Induced Hyperglycemia A1c 5.7   Needs to d/c home with meds from [facility], last admission wasn't able to pick up meds bc pharmacies closed.     Procedures: Echo IMPRESSIONS     1. Left ventricular ejection fraction, by estimation, is 50 to 55%. The  left ventricle has low normal function. The left ventricle has no regional  wall motion abnormalities. There is moderate left ventricular hypertrophy  of the basal-septal segment.  Left ventricular diastolic parameters are consistent with Grade I  diastolic dysfunction (impaired relaxation).   2. Right ventricular systolic function is normal. The right ventricular  size is normal.   3. The mitral valve is normal in structure. No evidence of mitral valve  regurgitation. No evidence of mitral stenosis.   4. The aortic valve is tricuspid. Aortic valve regurgitation is not  visualized. No aortic stenosis is present.    Consultations: none  Discharge Exam: Vitals:   03/15/24 0615 03/15/24 0853  BP:    Pulse: (!) 58   Resp: 11   Temp:    SpO2:  99%   No new complaints Noted coughing overnight Sig other at bedside  General: No acute distress. Cardiovascular: RRR Lungs: cough, but improved overall, less wheezing - unlabored Neurological: Alert and oriented 3. Moves all extremities 4 with equal strength. Cranial nerves II through XII grossly intact.  Extremities: No clubbing or cyanosis. No edema.   Discharge Instructions   Discharge Instructions     Call MD for:  difficulty breathing, headache or visual disturbances   Complete by: As directed    Call MD for:   extreme fatigue   Complete by: As directed    Call MD for:  hives   Complete by: As directed    Call MD for:  persistant dizziness or light-headedness   Complete by: As directed    Call MD for:  persistant nausea and vomiting   Complete by: As directed    Call MD for:  redness, tenderness, or signs of infection (pain, swelling, redness, odor or green/yellow discharge around incision site)   Complete by: As directed    Call MD for:  severe uncontrolled pain   Complete by: As directed    Call MD for:  temperature >100.4   Complete by: As directed    Discharge instructions   Complete by: As directed    You were seen for Schuyler Olden suspected COPD exacerbation.  You've improved with breathing treatments and steroids.  We'll send you home with Shakora Nordquist steroid taper and inhalers.  You'll have albuterol  which you should take as needed for shortness of breath.  In addition, I'll send you with breztri  which is Tyiana Hill controller medicine you should take daily as prescribed (2 puffs twice daily) to help control your symptoms.  You ultimately will need pulmonary function tests to help definitively diagnose your COPD.  I'll refer you to pulmonary as an outpatient.  You need oxygen with activity.  We'll send you with oxygen that you can use with activity.  Your PCP can help determine if you need this long term or if it can be discontinued.  Return for new, recurrent, or worsening symptoms.  Please ask your PCP to request records from this hospitalization so they know what was done and what the next steps will be.   Increase activity slowly   Complete by: As directed    Pulmonary Visit   Complete by: As directed    Suspected COPD   Reason for referral: Other Pulmonary      Allergies as of 03/15/2024       Reactions   Amlodipine  Benzoate Other (See Comments)   Patient reports this made her feel sick.   Amlodipine  Besylate    Patient reports this makes her feel sick.   Hydralazine  Other (See Comments)    Hydrochlorothiazide  Other (See Comments)   Lisinopril Other (See Comments)   Patient reports this made her feel sick.   Losartan Other (See Comments)   Metoprolol Tartrate    Patient reports this makes her feel sick.   Spironolactone     Nausea         Medication List     STOP taking these medications    chlorthalidone  25 MG tablet Commonly known as: HYGROTON    clonazePAM  0.5 MG tablet Commonly known as: KLONOPIN    guaiFENesin  600 MG 12 hr tablet Commonly known as: MUCINEX    multivitamin with minerals Tabs tablet       TAKE these medications    albuterol  108 (90 Base) MCG/ACT inhaler Commonly known as: VENTOLIN  HFA Inhale 2 puffs into the lungs every 4 (four) hours as needed for wheezing or shortness of breath. What changed: when to take this   Breztri  Aerosphere 160-9-4.8 MCG/ACT Aero inhaler Generic drug: budesonide -glycopyrrolate -formoterol  Inhale 2 puffs into the lungs in the morning and at bedtime.   carvedilol  6.25  MG tablet Commonly known as: COREG  Take 1 tablet (6.25 mg total) by mouth 2 (two) times daily with Jajuan Skoog meal. Follow with your PCP outpatient for refills and dose adjustment.  We reduced your dose because your heart rate was on the slower side. What changed:  medication strength how much to take additional instructions   folic acid  1 MG tablet Commonly known as: FOLVITE  Take 1 tablet (1 mg total) by mouth daily.   omeprazole  20 MG capsule Commonly known as: PRILOSEC TAKE 1 CAPSULE(20 MG) BY MOUTH DAILY   predniSONE  20 MG tablet Commonly known as: DELTASONE  Take 1.5 tablets (30 mg total) by mouth daily with breakfast for 2 days, THEN 1 tablet (20 mg total) daily with breakfast for 2 days, THEN 0.5 tablets (10 mg total) daily with breakfast for 2 days. Start taking on: March 16, 2024 What changed: See the new instructions.   rosuvastatin  20 MG tablet Commonly known as: CRESTOR  Take 1 tablet (20 mg total) by mouth daily.   thiamine  100 MG  tablet Commonly known as: Vitamin B-1 Take 1 tablet (100 mg total) by mouth daily.               Durable Medical Equipment  (From admission, onward)           Start     Ordered   03/15/24 1032  DME Oxygen  Once       Comments: SATURATION QUALIFICATIONS: (This note is used to comply with regulatory documentation for home oxygen)   Patient Saturations on Room Air at Rest = 91%   Patient Saturations on Room Air while Ambulating = 84%   Patient Saturations on 2 Liters of oxygen while Ambulating = 94%   Please briefly explain why patient needs home oxygen: To maintain sats over 88% with ambulation  Question Answer Comment  Length of Need 6 Months   Liters per Minute 2   Frequency Continuous (stationary and portable oxygen unit needed)   Oxygen conserving device Yes   Oxygen delivery system: Gas      03/15/24 1038           Allergies[1]    The results of significant diagnostics from this hospitalization (including imaging, microbiology, ancillary and laboratory) are listed below for reference.    Significant Diagnostic Studies: CT Angio Chest Pulmonary Embolism (PE) W or WO Contrast Addendum Date: 03/13/2024* ADDENDUM #1 * ADDENDUM: Diffuse bronchial wall thickening with bilateral lower lobe mucus plugging suggestive of small airway disease in the setting of emphysema/COPD. ---------------------------------------------------- Electronically signed by: Kate Plummer MD 03/13/2024 05:21 PM EST RP Workstation: [id number]   Result Date: 03/13/2024 * ORIGINAL REPORT * EXAM: CTA CHEST 03/13/2024 04:36:00 PM TECHNIQUE: CTA of the chest was performed without and with the administration of 75 mL of iohexol  (OMNIPAQUE ) 350 MG/ML intravenous contrast. Multiplanar reformatted images are provided for review. MIP images are provided for review. Automated exposure control, iterative reconstruction, and/or weight based adjustment of the mA/kV was utilized to reduce the radiation dose  to as low as reasonably achievable. COMPARISON: None available. CLINICAL HISTORY: shortness of breath FINDINGS: PULMONARY ARTERIES: Pulmonary arteries are adequately opacified for evaluation. No acute pulmonary embolus. Main pulmonary artery is normal in caliber. MEDIASTINUM: 4-vessel coronary artery calcifications. 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 LYMPH NODES: No mediastinal, hilar or axillary lymphadenopathy. LUNGS AND PLEURA: Moderate centrilobular emphysematous changes. Right base atelectasis. No focal consolidation or pulmonary edema. No evidence of  pleural effusion or pneumothorax. UPPER ABDOMEN: The bone-density lesion of the left kidney likely represents Zera Markwardt simple renal cyst. Simple renal cysts do not require additional follow-up unless clinically indicated due to signs/symptoms. Tiny hiatal hernia. SOFT TISSUES AND BONES: No acute bone or soft tissue abnormality. IMPRESSION: 1. No pulmonary embolism. 2. Emphysema. 3. Atherosclerotic plaque including 4-vessel coronary artery calcifications. Electronically signed by: Morgane Naveau MD 03/13/2024 05:13 PM EST RP Workstation: [id number]   ECHOCARDIOGRAM COMPLETE Result Date: 03/12/2024    ECHOCARDIOGRAM REPORT   Patient Name:   Wendy Jensen Date of Exam: 03/12/2024 Medical Rec #:  [id number]           Height:       65.0 in Accession #:    [id number]          Weight:       150.0 lb Date of Birth:  [birth year]           BSA:          1.750 m Patient Age:    63 years            BP:           115/69 mmHg Patient Gender: F                   HR:           60 bpm. Exam Location:  Inpatient Procedure: 2D Echo, Cardiac Doppler and Color Doppler (Both Spectral and Color            Flow Doppler were utilized during procedure). Indications:    Dyspnea R06.00  History:        Patient has prior history of Echocardiogram examinations, most                 recent 10/26/2021. COPD.   Sonographer:    Jayson Gaskins Referring Phys: [id number] MARSA NOVAK MELVIN IMPRESSIONS  1. Left ventricular ejection fraction, by estimation, is 50 to 55%. The left ventricle has low normal function. The left ventricle has no regional wall motion abnormalities. There is moderate left ventricular hypertrophy of the basal-septal segment. Left ventricular diastolic parameters are consistent with Grade I diastolic dysfunction (impaired relaxation).  2. Right ventricular systolic function is normal. The right ventricular size is normal.  3. The mitral valve is normal in structure. No evidence of mitral valve regurgitation. No evidence of mitral stenosis.  4. The aortic valve is tricuspid. Aortic valve regurgitation is not visualized. No aortic stenosis is present. FINDINGS  Left Ventricle: Left ventricular ejection fraction, by estimation, is 50 to 55%. The left ventricle has low normal function. The left ventricle has no regional wall motion abnormalities. The left ventricular internal cavity size was normal in size. There is moderate left ventricular hypertrophy of the basal-septal segment. Left ventricular diastolic parameters are consistent with Grade I diastolic dysfunction (impaired relaxation). Right Ventricle: The right ventricular size is normal. Right ventricular systolic function is normal. Left Atrium: Left atrial size was normal in size. Right Atrium: Right atrial size was normal in size. Pericardium: There is no evidence of pericardial effusion. Mitral Valve: The mitral valve is normal in structure. No evidence of mitral valve regurgitation. No evidence of mitral valve stenosis. Tricuspid Valve: The tricuspid valve is normal in structure. Tricuspid valve regurgitation is trivial. No evidence of tricuspid stenosis. Aortic Valve: The aortic valve is tricuspid. Aortic valve regurgitation is not visualized. No aortic stenosis is present. Aortic valve mean gradient measures 3.0 mmHg.  Aortic valve peak gradient  measures 4.2 mmHg. Aortic valve area, by VTI measures 2.93 cm. Pulmonic Valve: The pulmonic valve was not well visualized. Pulmonic valve regurgitation is not visualized. No evidence of pulmonic stenosis. Aorta: The aortic root is normal in size and structure. Venous: The inferior vena cava was not well visualized. IAS/Shunts: The interatrial septum was not well visualized.  LEFT VENTRICLE PLAX 2D LVIDd:         4.30 cm   Diastology LVIDs:         2.70 cm   LV e' medial:    6.20 cm/s LV PW:         1.00 cm   LV E/e' medial:  7.1 LV IVS:        1.20 cm   LV e' lateral:   5.11 cm/s LVOT diam:     1.96 cm   LV E/e' lateral: 8.6 LV SV:         63 LV SV Index:   36 LVOT Area:     3.02 cm  RIGHT VENTRICLE RV S prime:     11.00 cm/s TAPSE (M-mode): 2.1 cm LEFT ATRIUM             Index        RIGHT ATRIUM           Index LA Vol (A2C):   41.1 ml 23.48 ml/m  RA Area:     13.50 cm LA Vol (A4C):   19.5 ml 11.14 ml/m  RA Volume:   31.40 ml  17.94 ml/m LA Biplane Vol: 28.8 ml 16.45 ml/m  AORTIC VALVE AV Area (Vmax):    2.90 cm AV Area (Vmean):   2.38 cm AV Area (VTI):     2.93 cm AV Vmax:           103.00 cm/s AV Vmean:          87.200 cm/s AV VTI:            0.216 m AV Peak Grad:      4.2 mmHg AV Mean Grad:      3.0 mmHg LVOT Vmax:         99.00 cm/s LVOT Vmean:        68.900 cm/s LVOT VTI:          0.210 m LVOT/AV VTI ratio: 0.97 MITRAL VALVE MV Area (PHT): 2.27 cm    SHUNTS MV Decel Time: 334 msec    Systemic VTI:  0.21 m MV E velocity: 44.00 cm/s  Systemic Diam: 1.96 cm MV Raeya Merritts velocity: 88.60 cm/s MV E/Radin Raptis ratio:  0.50 Redell Shallow MD Electronically signed by Redell Shallow MD Signature Date/Time: 03/12/2024/3:34:30 PM    Final    DG Chest Port 1 View Result Date: 03/12/2024 EXAM: 1 VIEW(S) XRAY OF THE CHEST 03/12/2024 12:14:50 PM COMPARISON: 03/10/2024 CLINICAL HISTORY: sob sob FINDINGS: LUNGS AND PLEURA: No focal pulmonary opacity. No pleural effusion. No pneumothorax. HEART AND MEDIASTINUM: Aortic  atherosclerosis. No acute abnormality of the cardiac and mediastinal silhouettes. BONES AND SOFT TISSUES: No acute osseous abnormality. IMPRESSION: 1. No active cardiopulmonary disease. Electronically signed by: Franky Crease MD 03/12/2024 12:57 PM EST RP Workstation: [id number]   DG Chest 2 View Result Date: 03/10/2024 EXAM: 2 VIEW(S) XRAY OF THE CHEST 03/10/2024 07:01:36 AM COMPARISON: 05/12/2023 CLINICAL HISTORY: SOB FINDINGS: LUNGS AND PLEURA: No focal pulmonary opacity. No pleural effusion. No pneumothorax. HEART AND MEDIASTINUM: No acute abnormality of the cardiac and mediastinal silhouettes. Atherosclerotic calcifications. BONES  AND SOFT TISSUES: Multilevel degenerative changes of thoracic spine. IMPRESSION: 1. No acute process. Electronically signed by: Waddell Calk MD 03/10/2024 07:11 AM EST RP Workstation: [id number]    Microbiology: Recent Results (from the past 240 hours)  Respiratory (~20 pathogens) panel by PCR     Status: None   Collection Time: 03/12/24  1:34 PM   Specimen: Nasopharyngeal Swab; Respiratory  Result Value Ref Range Status   Adenovirus NOT DETECTED NOT DETECTED Final   Coronavirus 229E NOT DETECTED NOT DETECTED Final    Comment: (NOTE) The Coronavirus on the Respiratory Panel, DOES NOT test for the novel  Coronavirus (2019 nCoV)    Coronavirus HKU1 NOT DETECTED NOT DETECTED Final   Coronavirus NL63 NOT DETECTED NOT DETECTED Final   Coronavirus OC43 NOT DETECTED NOT DETECTED Final   Metapneumovirus NOT DETECTED NOT DETECTED Final   Rhinovirus / Enterovirus NOT DETECTED NOT DETECTED Final   Influenza Amman Bartel NOT DETECTED NOT DETECTED Final   Influenza B NOT DETECTED NOT DETECTED Final   Parainfluenza Virus 1 NOT DETECTED NOT DETECTED Final   Parainfluenza Virus 2 NOT DETECTED NOT DETECTED Final   Parainfluenza Virus 3 NOT DETECTED NOT DETECTED Final   Parainfluenza Virus 4 NOT DETECTED NOT DETECTED Final   Respiratory Syncytial Virus NOT DETECTED NOT DETECTED Final    Bordetella pertussis NOT DETECTED NOT DETECTED Final   Bordetella Parapertussis NOT DETECTED NOT DETECTED Final   Chlamydophila pneumoniae NOT DETECTED NOT DETECTED Final   Mycoplasma pneumoniae NOT DETECTED NOT DETECTED Final    Comment: Performed at [facility], [address], [place], [place] [address]     Labs: Basic Metabolic Panel: Recent Labs  Lab 03/10/24 1223 03/11/24 0520 03/12/24 1151 03/12/24 1213 03/13/24 0100 03/14/24 0300  NA 143 140 135 136 137 140  K 3.9 4.2 3.9 4.2 3.8 3.8  CL 104 104 101  --  101 102  CO2 24 26 23   --  24 31  GLUCOSE 151* 122* 267*  --  171* 86  BUN 13 15 16   --  15 17  CREATININE 0.72 0.69 0.69  --  0.64 0.76  CALCIUM  9.7 9.7 8.8*  --  9.0 9.0  MG  --   --   --   --   --  2.2  PHOS  --   --   --   --   --  4.7*   Liver Function Tests: Recent Labs  Lab 03/10/24 1223 03/11/24 0520 03/13/24 0100 03/14/24 0300  AST 27 32 27 22  ALT 12 12 12 16   ALKPHOS 116 96 84 71  BILITOT 0.9 0.6 0.5 0.6  PROT 8.5* 7.7 7.1 6.6  ALBUMIN 4.6 4.2 3.9 3.6   No results for input(s): LIPASE, AMYLASE in the last 168 hours. No results for input(s): AMMONIA in the last 168 hours. CBC: Recent Labs  Lab 03/10/24 1223 03/11/24 0520 03/12/24 1151 03/12/24 1213 03/13/24 0100 03/14/24 0300  WBC 11.1* 9.7 11.8*  --  5.1 8.6  NEUTROABS 6.4  --  7.3  --   --  5.4  HGB 15.6* 13.9 13.8 14.3 13.6 12.7  HCT 48.2* 42.7 43.1 42.0 41.0 38.9  MCV 96.4 95.3 97.1  --  94.9 94.4  PLT 370 325 327  --  308 290   Cardiac Enzymes: No results for input(s): CKTOTAL, CKMB, CKMBINDEX, TROPONINI in the last 168 hours. BNP: BNP (last 3 results) No results for input(s): BNP in the last 8760 hours.  ProBNP (  last 3 results) Recent Labs    03/12/24 1151  PROBNP 1,981.0*    CBG: No results for input(s): GLUCAP in the last 168 hours.     Signed:  Meliton Monte MD.  [facility] 03/15/2024, 10:40 AM       [1]   Allergies Allergen Reactions   Amlodipine  Benzoate Other (See Comments)    Patient reports this made her feel sick.   Amlodipine  Besylate     Patient reports this makes her feel sick.   Hydralazine  Other (See Comments)   Hydrochlorothiazide  Other (See Comments)   Lisinopril Other (See Comments)    Patient reports this made her feel sick.   Losartan Other (See Comments)   Metoprolol Tartrate     Patient reports this makes her feel sick.   Spironolactone      Nausea

## 2024-03-15 NOTE — Progress Notes (Signed)
 O2 has been delivered

## 2024-03-15 NOTE — TOC CM/SW Note (Signed)
 Referral received to assist with home O2. Met with pt at beside. She reports that she doesn't have a preference for a DME agency. She agreed to use [employer]. Contacted Jermaine at [employer] and he accepted the referral. She reports her son will be providing transportation today.

## 2024-03-17 ENCOUNTER — Telehealth: Payer: Self-pay | Admitting: *Deleted

## 2024-03-17 ENCOUNTER — Telehealth: Payer: Self-pay | Admitting: Cardiology

## 2024-03-17 ENCOUNTER — Ambulatory Visit: Payer: Self-pay

## 2024-03-17 DIAGNOSIS — Z5941 Food insecurity: Secondary | ICD-10-CM

## 2024-03-17 DIAGNOSIS — Z5982 Transportation insecurity: Secondary | ICD-10-CM

## 2024-03-17 NOTE — Transitions of Care (Post Inpatient/ED Visit) (Signed)
" ° °  03/17/2024  Name: Wendy Jensen MRN: [id number] DOB: 03-Sep-1960  Today's TOC FU Call Status: Today's TOC FU Call Status:: Unsuccessful Call (1st Attempt) Unsuccessful Call (1st Attempt) Date: 03/17/24  Attempted to reach the patient regarding the most recent Inpatient/ED visit.  Follow Up Plan: Additional outreach attempts will be made to reach the patient to complete the Transitions of Care (Post Inpatient/ED visit) call.   Andrea Dimes RN, BSN [place]  [facility] RN Care Manager [phone]  "

## 2024-03-17 NOTE — Telephone Encounter (Signed)
"  Noted thanks     "

## 2024-03-17 NOTE — Telephone Encounter (Signed)
 Spoke with pt regarding her blood pressure. Pt stated her bp was 217/119 earlier today but is now 183/80. Pt denied having any symptoms. Pt stated she is fine now but calling in about her medications. Pt stated her meds were changed in the hospital because her bp was low but now her bp is very high. Pt is asking about going to back to taking 25 mg of carvedilol  twice daily. Pt stated she was taking another medication but is unsure of what it was. The name of the medication came off of the bottle and the pt did not recognize the names of medications I presented to her from her past. Pt stated it is a small green pill. Pt would like Dr. Alvan input. Pt was told her questions would be sent to Dr. Kate. Pt verbalized understanding. All questions if any were answered.

## 2024-03-17 NOTE — Telephone Encounter (Signed)
 Recently hospitalized for COPD exacerbation.  Very anxious on prednisone , reporting increasing BP. 213/117 at time of triage.   Called 911 at 1214, EMS en route.  Son of pt [facility]) arrived, with patient.  Called 911 again at 1241, EMS en route for 6 minutes per dispatcher. Patient and her son Devon notified, call ended.   FYI Only or Action Required?: Action required by provider: update on patient condition.  Patient was last seen in primary care on 05/15/2023 by Tanda Bleacher, MD.  Called Nurse Triage reporting Anxiety.  Symptoms began today.  Interventions attempted: Prescription medications: coreg .  Symptoms are: rapidly worsening.  Triage Disposition: Go to ED Now (Notify PCP)  Patient/caregiver understands and will follow disposition?: Yes  Copied from CRM [id number]. Topic: Clinical - Red Word Triage >> Mar 17, 2024 11:48 AM Hadassah PARAS wrote: Red Word that prompted transfer to Nurse Triage: Pt is experiencing anxiety, overwhelmed and is wanting to know if she can take the other half of sertaline , as it is suppose to be half a pill a day. Reason for Disposition  [1] Systolic BP >= 160 OR Diastolic >= 100 AND [2] cardiac (e.g., breathing difficulty, chest pain) or neurologic symptoms (e.g., new-onset blurred or double vision, unsteady gait)    Pt excited and anxious, with BP of 203/117. 911 called on behalf of pt at 1114  Answer Assessment - Initial Assessment Questions 1. CONCERN: Did anything happen that prompted you to call today?      Recently reports an increase in anxiety since being discharged from the hospital and taking new medications (doxycycline  and prednisone ). Pt speaking   2. ANXIETY SYMPTOMS: Can you describe how you (your loved one; patient) have been feeling? (e.g., tense, restless, panicky, anxious, keyed up, overwhelmed, sense of impending doom).      Panicky  3. ONSET: How long have you been feeling this way? (e.g., hours, days, weeks)     This AM after  meds  4. SEVERITY: How would you rate the level of anxiety? (e.g., 0 - 10; or mild, moderate, severe).     Severe  5. FUNCTIONAL IMPAIRMENT: How have these feelings affected your ability to do daily activities? Have you had more difficulty than usual doing your normal daily activities? (e.g., getting better, same, worse; self-care, school, work, interactions)     I been laying around  6. HISTORY: Have you felt this way before? Have you ever been diagnosed with an anxiety problem in the past? (e.g., generalized anxiety disorder, panic attacks, PTSD). If Yes, ask: How was this problem treated? (e.g., medicines, counseling, etc.)     Hx of anxiety but this is more than normal  7. RISK OF HARM - SUICIDAL IDEATION: Do you ever have thoughts of hurting or killing yourself? If Yes, ask:  Do you have these feelings now? Do you have a plan on how you would do this?     Denies  10. POTENTIAL TRIGGERS: Do you drink caffeinated beverages (e.g., coffee, colas, teas), and how much daily? Do you drink alcohol or use any drugs? Have you started any new medicines recently?       Recently started taking prednisone , unsure if that is what is causing the spike in anxiety.  11. PATIENT SUPPORT: Who is with you now? Who do you live with? Do you have family or friends who you can talk to?        Devin her son, on his way  73. OTHER SYMPTOMS: Do you have  any other symptoms? (e.g., feeling depressed, trouble concentrating, trouble sleeping, trouble breathing, palpitations or fast heartbeat, chest pain, sweating, nausea, or diarrhea)       No CP, SOB  2L O2  189/98, HR 84  Answer Assessment - Initial Assessment Questions 1. BLOOD PRESSURE: What is your blood pressure? Did you take at least two measurements 5 minutes apart?     189/98, 213/117, then 183/95 while waiting 15+ minutes for EMS  2. ONSET: When did you take your blood pressure?     During time of triage  3.  HOW: How did you take your blood pressure? (e.g., automatic home BP monitor, visiting nurse)     At home BP monitor.   4. HISTORY: Do you have a history of high blood pressure?     Yes  5. MEDICINES: Are you taking any medicines for blood pressure? Have you missed any doses recently?     Carvedilol , no. Her daily dosage recently changed from 25mg  to 12.5mg    6. OTHER SYMPTOMS: Do you have any symptoms? (e.g., blurred vision, chest pain, difficulty breathing, headache, weakness)     No CP, no SOB  Protocols used: Anxiety and Panic Attack-A-AH, Blood Pressure - High-A-AH

## 2024-03-17 NOTE — Telephone Encounter (Signed)
 Pt c/o BP issue: STAT if pt c/o blurred vision, one-sided weakness or slurred speech.  STAT if BP is GREATER than 180/120 TODAY.  STAT if BP is LESS than 90/60 and SYMPTOMATIC TODAY  1. What is your BP concern? Hypertension   2. Have you taken any BP medication today?Yes   3. What are your last 5 BP readings? 217/119 today   4. Are you having any other symptoms (ex. Dizziness, headache, blurred vision, passed out)? No

## 2024-03-18 ENCOUNTER — Telehealth: Payer: Self-pay

## 2024-03-18 ENCOUNTER — Telehealth (HOSPITAL_BASED_OUTPATIENT_CLINIC_OR_DEPARTMENT_OTHER): Payer: Self-pay | Admitting: Licensed Clinical Social Worker

## 2024-03-18 ENCOUNTER — Telehealth: Payer: Self-pay | Admitting: *Deleted

## 2024-03-18 ENCOUNTER — Ambulatory Visit: Payer: Self-pay

## 2024-03-18 NOTE — Telephone Encounter (Signed)
 PT calling back to check status of medication concerns

## 2024-03-18 NOTE — Transitions of Care (Post Inpatient/ED Visit) (Signed)
 "  03/18/2024  Name: Wendy Jensen MRN: [id number] DOB: [birth year]  Today's TOC FU Call Status: Today's TOC FU Call Status:: Successful TOC FU Call Completed TOC FU Call Complete Date: 03/18/24  Patient's Name and Date of Birth confirmed. Name, DOB  Transition Care Management Follow-up Telephone Call Date of Discharge: 03/15/24 Discharge Facility: [facility]) Type of Discharge: Inpatient Admission Primary Inpatient Discharge Diagnosis:: Acute respiratory failure with hypoxia How have you been since you were released from the hospital?: Better  Items Reviewed: Did you receive and understand the discharge instructions provided?: Yes Medications obtained,verified, and reconciled?: Yes (Medications Reviewed) Any new allergies since your discharge?: No Dietary orders reviewed?: Yes Type of Diet Ordered:: heart healthy Do you have support at home?: Yes People in Home [RPT]: significant other Name of Support/Comfort Primary Source: Fiance  Medications Reviewed Today: Medications Reviewed Today     Reviewed by Lucky Andrea LABOR, RN (Registered Nurse) on 03/18/24 at 1554  Med List Status: <None>   Medication Order Taking? Sig Documenting Provider Last Dose Status Informant  albuterol  (VENTOLIN  HFA) 108 (90 Base) MCG/ACT inhaler [id number] Yes Inhale 2 puffs into the lungs every 4 (four) hours as needed for wheezing or shortness of breath. Perri LABOR Meliton Mickey., MD  Active   budesonide -glycopyrrolate -formoterol  (BREZTRI  AEROSPHERE) 160-9-4.8 MCG/ACT AERO inhaler [id number] Yes Inhale 2 puffs into the lungs in the morning and at bedtime. Perri LABOR Meliton Mickey., MD  Active   carvedilol  (COREG ) 6.25 MG tablet [id number] Yes Take 1 tablet (6.25 mg total) by mouth 2 (two) times daily with a meal. Follow with your PCP outpatient for refills and dose adjustment.  We reduced your dose because your heart rate was on the slower side.  Patient taking differently: Take 25 mg by mouth 2 (two) times  daily with a meal. As directed by Cardiology on 03/18/24   Perri LABOR Meliton Mickey., MD  Active   chlorthalidone  (HYGROTON ) 25 MG tablet [id number] Yes Take 25 mg by mouth daily. [provider]  Active   doxycycline  (VIBRA -TABS) 100 MG tablet [id number] Yes Take 1 tablet (100 mg total) by mouth 2 (two) times daily for 5 days. Perri LABOR Meliton Mickey., MD  Active   folic acid  (FOLVITE ) 1 MG tablet [id number]  Take 1 tablet (1 mg total) by mouth daily.  Patient not taking: Reported on 03/18/2024   Darci Pore, MD  Active Self, Pharmacy Records  omeprazole  [facility]) 20 MG capsule [id number]  TAKE 1 CAPSULE(20 MG) BY MOUTH DAILY  Patient not taking: Reported on 03/18/2024   Tanda Bleacher, MD  Active Self, Pharmacy Records  predniSONE  (DELTASONE ) 20 MG tablet [id number] Yes Take 1.5 tablets (30 mg total) by mouth daily with breakfast for 2 days, THEN 1 tablet (20 mg total) daily with breakfast for 2 days, THEN 0.5 tablets (10 mg total) daily with breakfast for 2 days. Perri LABOR Meliton Mickey., MD  Active   rosuvastatin  (CRESTOR ) 20 MG tablet [id number] Yes Take 1 tablet (20 mg total) by mouth daily. Perri LABOR Meliton Mickey., MD  Active   thiamine  (VITAMIN B-1) 100 MG tablet [id number]  Take 1 tablet (100 mg total) by mouth daily.  Patient not taking: Reported on 03/18/2024   Darci Pore, MD  Active Self, Pharmacy Records            Home Care and Equipment/Supplies: Were Home Health Services Ordered?: NA Any new equipment or medical supplies ordered?: Yes Name of Medical supply agency?: Oxygen from [employer] Were you able  to get the equipment/medical supplies?: Yes Do you have any questions related to the use of the equipment/supplies?: No  Functional Questionnaire: Do you need assistance with bathing/showering or dressing?: No Do you need assistance with meal preparation?: No Do you need assistance with eating?: No Do you have difficulty maintaining continence: No Do you need  assistance with getting out of bed/getting out of a chair/moving?: No Do you have difficulty managing or taking your medications?: No  Follow up appointments reviewed: PCP Follow-up appointment confirmed?: Yes Date of PCP follow-up appointment?: 03/26/24 Follow-up Provider: Dr. Tanda Specialist [facility] Follow-up appointment confirmed?: No Reason Specialist Follow-Up Not Confirmed: Appointment Sceduled by [facility] Calling Clinician (RNCM assisted with scheduling with Pulmonology on 03/20/24) Do you need transportation to your follow-up appointment?: No Do you understand care options if your condition(s) worsen?: Yes-patient verbalized understanding   Goals Addressed             This Visit's Progress    VBCI Transitions of Care (TOC) Care Plan       Problems:  Recent Hospitalization for treatment of Respiratory Failure No Specialist appointment referral to Pulmonology -has not been scheduled  Goal:  Over the next 30 days, the patient will not experience hospital readmission  Interventions:  Transitions of Care: Durable Medical Equipment (DME) reviewed with patient/caregiver Doctor Visits  - discussed the importance of doctor visits Post discharge activity limitations prescribed by provider reviewed Reviewed Signs and symptoms of infection Medication review-discussed changes to BP medications since discharge, reviewed Prednisone  instructions in detail Assisted with scheduling with Pulmonology on 03/20/24 Reviewed BP Reviewed oxygen saturation  Patient Self Care Activities:  Attend all scheduled provider appointments Call provider office for new concerns or questions  Notify RN Care Manager of [facility] call rescheduling needs Participate in Transition of Care Program/Attend TOC scheduled calls Take medications as prescribed   check blood pressure daily write blood pressure results in a log or diary keep a blood pressure log keep all doctor appointments eat more whole grains, fruits  and vegetables, lean meats and healthy fats  Plan:  Telephone follow up appointment with care management team member scheduled for:  03/25/24 at 2:30pm       Discussed and offered 30 day TOC program.  Patient      enrolled .  The patient has been provided with contact information for the care management team and has been advised to call with any health -related questions or concerns.  The patient verbalized understanding with current plan of care.  The patient is directed to their insurance card regarding availability of benefits coverage.   Patient had a visitor during this outreach. RNCM was unable to complete SDOH assessment. RNCM will review during next outreach.  Andrea Dimes RN, BSN [place]  [facility] RN Care Manager [phone]  "

## 2024-03-18 NOTE — Telephone Encounter (Signed)
 I received a message from Marit Lark, [place] / cardiology stating that the patient is requesting a portable O2 tank.   I called the patient and she confirmed she has a room O2 concentrator and portable O2 tanks. She was sent home from the hospital with the O2 tanks. She also confirmed that she has a wheeled O2 carrier for the tanks.  I explained to her that she can go out with those portable tanks, she just needs to make sure they are full. If she needs refills or has questions about the tanks she needs to contact [employer].   I then asked her if she is requesting the portable O2 concentrator that plugs in to re-charge and she said yes.  She said can't go shopping with the current portable tanks because she said they are difficult to manage getting in/ out of a vehicle as well as carrying groceries.    I explained that the portable concentrators deliver O2 with a pulse dose and I explained the difference between that and the continuous flow of O2 with the tanks she has.  I told her that she would need to be evaluated by [employer] to make sure she can tolerate the pulse dosing and [employer] would require an order from the provider to have her evaluated for a portable O2 concentrator.   She has not yet established care with a pulmonologist and I told her that she can discuss this request further with Dr Tanda at her upcoming appointment on 03/26/2024.   I reminded her again that she can go out with her current O2 tanks as long as they are full.  She was appreciative of the information and she said her son is out getting her some food now.

## 2024-03-18 NOTE — Telephone Encounter (Signed)
 Recommend increasing carvedilol  from 6.25 mg twice daily to 12.5 mg twice daily.  Recommend scheduling in pharmacy hypertension clinic.  Would ask to check BP twice daily and bring log and home monitor to calibrate to appointment with pharmacy

## 2024-03-18 NOTE — Telephone Encounter (Signed)
 Called pt and advised that Dr. Tanda is not able to place order for portable oxygen due to the requirements needed for ordering. Dr. Tanda advised patient to reach out to her pulmonologist. Patient reports just getting this condition and is waiting on a call from pulmonologist. Pt ended phone call stating her cardiologist was calling her.

## 2024-03-18 NOTE — Telephone Encounter (Signed)
 H&V Care Navigation CSW Progress Note  Clinical Social Worker received a message from Jasmine, [place] to inquire about assistance with food or portable oxygen. LCSW noted that pt recently d/c from hospital with Home O2 DME from [employer]. Pt stating that she cannot go get food to take her medications b/c she doesn't have a portable tank.   LCSW reviewed chart, noted that she was sent home with at least one portable tank. I reached out to [facility] with [facility] clinics. She clarified pt does have portable tank per documentation and home concentrator. She called and spoke with pt and updated me that pt has portable tank/can leave the house with it/her son was present and getting her food during her call. LCSW team remains available as needed.   Patient is participating in a Managed Medicaid Plan:  No, [employer] only  SDOH Screenings   Food Insecurity: Food Insecurity Present (03/12/2024)  Housing: Low Risk (03/12/2024)  Transportation Needs: No Transportation Needs (03/12/2024)  Utilities: Not At Risk (03/12/2024)  Alcohol Screen: Low Risk (05/15/2023)  Depression (PHQ2-9): Low Risk (05/15/2023)  Financial Resource Strain: High Risk (05/15/2023)  Physical Activity: Insufficiently Active (05/15/2023)  Social Connections: Unknown (03/12/2024)  Stress: Stress Concern Present (02/12/2023)  Tobacco Use: Medium Risk (03/12/2024)  Health Literacy: Adequate Health Literacy (05/15/2023)    Marit Lark, MSW, LCSW Clinical Social Worker II [facility] Health Heart/Vascular Care Navigation  [phone]- work cell phone (preferred)

## 2024-03-18 NOTE — Telephone Encounter (Signed)
 Copied from CRM [id number]. Topic: Clinical - Order For Equipment >> Mar 18, 2024 10:17 AM Rosaria BRAVO wrote: Reason for CRM: Pt called reporting that she needs her PCP to authorize her Portable Oxygen Carrier [place] [phone]  Says she needs this done today, because her refrigerator is down and she needs to eat.

## 2024-03-18 NOTE — Telephone Encounter (Signed)
 Called pt to relay message. Pt states her medication was reduced from 25 mg to 6.25 mg because her blood pressure was so low. Pt has taking 25 mg today. Blood pressure on the phone  176/100 HR 60 Pt takes Chlorthalidone  25 mg once daily for years. The medication is not on her list when reviewing.  Medication added to medication list per pt's request.   Pt is concerned due to currently taking Prednisone  and it increasing her blood pressure yesterday.   Pt will start taking Coreg  25 mg twice daily as prescribed prior being admitted.   Will consult pharmacy for advise.

## 2024-03-18 NOTE — Telephone Encounter (Signed)
 Called patient back about medications. Patient's current BP 186/96 HR 74. Patient stated she was going to go ahead and take her chlorthalidone  25 mg and coreg  25 mg, because she is tired of waiting to hear back from the doctor. ED discontinued chlorthalidone  and reduced coreg  6.25 mg BID. Patient would like to go back to her regular doses. Will forward to Dr. Kate for the okay for patient to go back to her original doses.

## 2024-03-18 NOTE — Telephone Encounter (Signed)
 FYI Only or Action Required?: Action required by provider: request for documentation or forms.  Patient was last seen in primary care on 05/15/2023 by Tanda Bleacher, MD.  Called Nurse Triage reporting Orders.  Triage Disposition: Call PCP Now  Patient/caregiver understands and will follow disposition?: Yes                   Copied from CRM [id number]. Topic: Clinical - Red Word Triage >> Mar 18, 2024 11:10 AM Kevelyn M wrote: Red Word that prompted transfer to Nurse Triage: Patient calling in because she needs the provider to call  because she has no oxygen. Patient's refrigerator is not working. Patinet needs to eat but can not go out of her house with out the oxygen. Lost connection with the patient and tried to reconnect with her 2 times. Also attempted to call [facility] [place] 2 times. Reason for Disposition  [1] Follow-up call from patient regarding patient's clinical status AND [2] information urgent  Answer Assessment - Initial Assessment Questions 1. REASON FOR CALL or QUESTION: What is your reason for calling today? or How can I best     She states she was released/discharged from the hospital on Saturday and was given home oxygen. But she states she needs portable oxygen and the [employer] told her they need a doctor's order to release the portable oxygen to her. She states she uses 2L [place] O2. Patient states no triage needed or symptoms at this time. She states her fridge broke and she needs food at home but can't leave the house due to no portable oxygen. RN asked if she has any friends or family who can help pick up groceries for her today. She states not at this time due to they are all at work but patient had to abruptly end triage call due to she states one of  her neighbor's was contacting her and she will try to get them to help her with food.  2. CALLER: Document the source of call. (e.g., laboratory staff, caregiver or patient).      Patient.  Patient called in yesterday and was triaged, had rescue called. She states she was not taken to the ED and rescue told her to calm down and stay home so she didn't get exposed to any infectious diseases at the hospital. RN asked if any issues or symptoms still and she states no.  Protocols used: PCP Call - No Triage-A-AH

## 2024-03-19 NOTE — Progress Notes (Signed)
 "  New Patient Pulmonology Office Visit   Subjective:  Patient ID: Wendy Jensen, female    DOB: Jun 25, 1960  MRN: [id number]  Referred by: Perri DELENA Meliton Mickey., MD  CC:  Chief Complaint  Patient presents with   Consult    Pt states x 1 week SOB, right before was coughing up green sputum / took otc Mucx    Discussed the use of AI scribe software for clinical note transcription with the patient, who gave verbal consent to proceed.  History of Present Illness Wendy Jensen is a 64 year old female who presents after COPD exacerbation admission.  Symptoms began about four weeks ago with nocturnal cough and phlegm that did not improve with intermittent Mucinex . About one week before this visit she developed shortness of breath similar to a prior bronchitis episode. She increased inhaler use over two to three days, called paramedics, and was hospitalized for one week, when she was started on oxygen for the first time.  Since discharge she uses oxygen intermittently at home, mainly with activity. She reports home oxygen saturations around 93%, not below 88%. She uses breztri  twice daily and additional doses when she feels panicky.  She denies a prior COPD diagnosis and this was her first respiratory-related hospital admission. She smoked cigarettes for about 30 years and quit 4 years ago in 2021. She currently smokes marijuana once or twice daily with non-tobacco wrappers for anxiety.  ROS as above   Allergies: Amlodipine  benzoate, Amlodipine  besylate, Hydralazine , Hydrochlorothiazide , Lisinopril, Losartan, Metoprolol tartrate, and Spironolactone  Current Medications[1] Past Medical History:  Diagnosis Date   GERD (gastroesophageal reflux disease)    Hypertension    Peptic ulcer    Reflux    Past Surgical History:  Procedure Laterality Date   COLONOSCOPY     ESOPHAGEAL DILATION     TUBAL LIGATION     UPPER GI ENDOSCOPY     Family History  Problem Relation Age of Onset    Hypertension Father    Social History   Socioeconomic History   Marital status: Single    Spouse name: Not on file   Number of children: Not on file   Years of education: Not on file   Highest education level: Not on file  Occupational History   Not on file  Tobacco Use   Smoking status: Former    Current packs/day: 0.00    Average packs/day: 0.2 packs/day    Types: Cigarettes    Quit date: 09/10/2018    Years since quitting: 5.5   Smokeless tobacco: Never  Vaping Use   Vaping status: Never Used  Substance and Sexual Activity   Alcohol use: Yes    Comment: 1 glass beer per day   Drug use: No   Sexual activity: Not Currently  Other Topics Concern   Not on file  Social History Narrative   Not on file   Social Drivers of Health   Tobacco Use: Medium Risk (03/12/2024)   Patient History    Smoking Tobacco Use: Former    Smokeless Tobacco Use: Never    Passive Exposure: Not on file  Financial Resource Strain: High Risk (05/15/2023)   Overall Financial Resource Strain (CARDIA)    Difficulty of Paying Living Expenses: Very hard  Food Insecurity: Food Insecurity Present (03/12/2024)   Epic    Worried About Programme Researcher, Broadcasting/film/video in the Last Year: Often true    Barista in the Last Year: Often true  Transportation Needs: No  Transportation Needs (03/12/2024)   Epic    Lack of Transportation (Medical): No    Lack of Transportation (Non-Medical): No  Physical Activity: Insufficiently Active (05/15/2023)   Exercise Vital Sign    Days of Exercise per Week: 3 days    Minutes of Exercise per Session: 30 min  Stress: Stress Concern Present (02/12/2023)   [employer] of Occupational Health - Occupational Stress Questionnaire    Feeling of Stress : Very much  Social Connections: Unknown (03/12/2024)   Social Connection and Isolation Panel    Frequency of Communication with Friends and Family: Not on file    Frequency of Social Gatherings with Friends and Family: Not on  file    Attends Religious Services: Not on file    Active Member of Clubs or Organizations: Not on file    Attends Banker Meetings: Not on file    Marital Status: Living with partner  Intimate Partner Violence: Not At Risk (03/12/2024)   Epic    Fear of Current or Ex-Partner: No    Emotionally Abused: No    Physically Abused: No    Sexually Abused: No  Depression (PHQ2-9): Low Risk (03/18/2024)   Depression (PHQ2-9)    PHQ-2 Score: 0  Alcohol Screen: Low Risk (05/15/2023)   Alcohol Screen    Last Alcohol Screening Score (AUDIT): 3  Housing: Low Risk (03/12/2024)   Epic    Unable to Pay for Housing in the Last Year: No    Number of Times Moved in the Last Year: 0    Homeless in the Last Year: No  Utilities: Not At Risk (03/12/2024)   Epic    Threatened with loss of utilities: No  Health Literacy: Adequate Health Literacy (05/15/2023)   B1300 Health Literacy    Frequency of need for help with medical instructions: Never       Objective:  There were no vitals taken for this visit. Wt Readings from Last 3 Encounters:  03/20/24 137 lb 9.6 oz (62.4 kg)  03/15/24 141 lb 8.6 oz (64.2 kg)  03/10/24 150 lb (68 kg)   BMI Readings from Last 3 Encounters:  03/20/24 22.90 kg/m  03/15/24 23.55 kg/m  03/10/24 24.96 kg/m   SpO2 Readings from Last 3 Encounters:  03/20/24 96%  03/18/24 93%  03/15/24 99%    Physical Exam Constitutional:      Appearance: Normal appearance.  HENT:     Head: Normocephalic.  Eyes:     Extraocular Movements: Extraocular movements intact.     Pupils: Pupils are equal, round, and reactive to light.  Cardiovascular:     Rate and Rhythm: Normal rate and regular rhythm.  Pulmonary:     Effort: Pulmonary effort is normal.     Breath sounds: Normal breath sounds.     Comments: No wheezing  Musculoskeletal:        General: Normal range of motion.  Skin:    General: Skin is warm.  Neurological:     General: No focal deficit present.      Mental Status: She is alert and oriented to person, place, and time.    Diagnostic Review:   CT angio 03/13/24 1. No pulmonary embolism. 2. Moderate Emphysema. 3. Atherosclerotic plaque including 4-vessel coronary artery calcifications  03/20/2024 Performed walking test and she did not require oxygen however she had a few seconds of O2 84%, and recover without using O2.  Assessment & Plan:   Assessment & Plan Chronic obstructive pulmonary disease (COPD) with  recent exacerbation  Centrilobular emphysema Former smoker - quitted 2021  Recent exacerbation likely due to weather changes and sinus issues. Hospitalized for one week with oxygen therapy. Currently on home oxygen with saturation at 93%. No prior COPD diagnosis. 30-year smoking history, quit 4 years ago, current marijuana use. CT shows emphysema. Pulmonary function test needed for severity assessment. Discussed oxygen use criteria, exercise importance, and advised against inhaling substances. Discussed lung cancer screening eligibility.  Performed walking test and she did not require oxygen however she had a few seconds of O2 84%, and recover without using O2. 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  - Continue Breztri  inhaler, 2 puffs BID. Rinse her mouth after use. - Use albuterol  inhaler as needed  - Ordered pulmonary function test to assess COPD severity. - Referred to lung cancer screening program for annual CT scan. - Encouraged daily exercise and walking. - Walking test in 3 weeks. - She will request FMLA and I provide a letter to possible return to work after 3 weeks, after an appt with me on 04/15/23. - Discussed potential need for oxygen therapy, when O2 < 88%.   I personally spent a total of  40 minutes in the care of the patient today including preparing to see the patient, getting/reviewing separately  obtained history, performing a medically appropriate exam/evaluation, counseling and educating, placing orders, independently interpreting results, and communicating results.   Marny Patch, MD Pulmonary and Critical Care Medicine [facility]     [1]  Current Outpatient Medications:    albuterol  (VENTOLIN  HFA) 108 (90 Base) MCG/ACT inhaler, Inhale 2 puffs into the lungs every 4 (four) hours as needed for wheezing or shortness of breath., Disp: 6.7 g, Rfl: 0   budesonide -glycopyrrolate -formoterol  (BREZTRI  AEROSPHERE) 160-9-4.8 MCG/ACT AERO inhaler, Inhale 2 puffs into the lungs in the morning and at bedtime., Disp: 10.7 g, Rfl: 0   carvedilol  (COREG ) 6.25 MG tablet, Take 1 tablet (6.25 mg total) by mouth 2 (two) times daily with a meal. Follow with your PCP outpatient for refills and dose adjustment.  We reduced your dose because your heart rate was on the slower side. (Patient taking differently: Take 25 mg by mouth 2 (two) times daily with a meal. As directed by Cardiology on 03/18/24), Disp: 60 tablet, Rfl: 0   chlorthalidone  (HYGROTON ) 25 MG tablet, Take 25 mg by mouth daily., Disp: , Rfl:    doxycycline  (VIBRA -TABS) 100 MG tablet, Take 1 tablet (100 mg total) by mouth 2 (two) times daily for 5 days., Disp: 10 tablet, Rfl: 0   folic acid  (FOLVITE ) 1 MG tablet, Take 1 tablet (1 mg total) by mouth daily. (Patient not taking: Reported on 03/18/2024), Disp: 30 tablet, Rfl: 1   omeprazole  (PRILOSEC) 20 MG capsule, TAKE 1 CAPSULE(20 MG) BY MOUTH DAILY (Patient not taking: Reported on 03/18/2024), Disp: 90 capsule, Rfl: 1   predniSONE  (DELTASONE ) 20 MG tablet, Take 1.5 tablets (30 mg total) by mouth daily with breakfast for 2 days, THEN 1 tablet (20 mg total) daily with breakfast for 2 days, THEN 0.5 tablets (10 mg total) daily with breakfast for 2 days., Disp: 6 tablet, Rfl: 0   rosuvastatin  (CRESTOR ) 20 MG tablet, Take 1 tablet (20 mg total) by mouth daily., Disp: 90 tablet, Rfl: 0    thiamine  (VITAMIN B-1) 100 MG tablet, Take 1 tablet (  100 mg total) by mouth daily. (Patient not taking: Reported on 03/18/2024), Disp: 30 tablet, Rfl: 1  "

## 2024-03-20 ENCOUNTER — Ambulatory Visit (INDEPENDENT_AMBULATORY_CARE_PROVIDER_SITE_OTHER)

## 2024-03-20 VITALS — BP 142/90 | HR 76 | Ht 65.0 in | Wt 137.6 lb

## 2024-03-20 DIAGNOSIS — J432 Centrilobular emphysema: Secondary | ICD-10-CM

## 2024-03-20 DIAGNOSIS — J449 Chronic obstructive pulmonary disease, unspecified: Secondary | ICD-10-CM

## 2024-03-20 DIAGNOSIS — Z87891 Personal history of nicotine dependence: Secondary | ICD-10-CM | POA: Diagnosis not present

## 2024-03-20 MED ORDER — ALBUTEROL SULFATE HFA 108 (90 BASE) MCG/ACT IN AERS: 2.0000 | INHALATION_SPRAY | RESPIRATORY_TRACT | 6 refills | Status: AC | PRN

## 2024-03-20 MED ORDER — BREZTRI AEROSPHERE 160-9-4.8 MCG/ACT IN AERO: 2.0000 | INHALATION_SPRAY | Freq: Two times a day (BID) | RESPIRATORY_TRACT | 6 refills | Status: AC

## 2024-03-20 NOTE — Patient Instructions (Addendum)
 Dear Ms. Depner;   I will recommend the following:  -Continue Breztri  2 puffs twice a day. Rinse your mouth afterwards. -Continue albuterol  as need it every 6 hours for shortness of breath . -I will do a pulmonary function test in 3 months. -I will refer you to the lung cancer screening program.  -Continue to exercise and walking every day.   I will see you in February second for a walking test.

## 2024-03-21 ENCOUNTER — Telehealth: Payer: Self-pay | Admitting: Licensed Clinical Social Worker

## 2024-03-21 NOTE — Telephone Encounter (Addendum)
 Tried to call patient several time. The recording states number cannot be completed at this time, please call again later.

## 2024-03-21 NOTE — Telephone Encounter (Signed)
 H&V Care Navigation CSW Progress Note  Clinical Social Worker contacted patient by phone to f/u on referral for food. Concerns related to portable tank and getting to the store resolved by case management with pcp team. However reached out to provide any additional resources pt may be interested in. LCSW contacted pt and was able to reach her at [phone], she shares she is trying to get up and move around a bit and requested a call back. Called her as discussed again and when I called again the number said out of service and then was able to get through.   Pt shares on second call that she's feeling a little anxious but okay to chat briefly. Confirmed full name, DOB and home address. We discussed referral for food resources, pt shares she would be interested in those being sent to her home address. When asked no additional questions or concerns shared with me today but encouraged her to call me as needed. Will include my card with resource packet.   Patient is participating in a Managed Medicaid Plan:  No, [employer]na state plan  SDOH Screenings   Food Insecurity: Food Insecurity Present (03/12/2024)  Housing: Low Risk (03/12/2024)  Transportation Needs: No Transportation Needs (03/12/2024)  Utilities: Not At Risk (03/12/2024)  Alcohol Screen: Low Risk (05/15/2023)  Depression (PHQ2-9): Low Risk (03/18/2024)  Financial Resource Strain: High Risk (05/15/2023)  Physical Activity: Insufficiently Active (05/15/2023)  Social Connections: Unknown (03/12/2024)  Stress: Stress Concern Present (02/12/2023)  Tobacco Use: Medium Risk (03/20/2024)  Health Literacy: Adequate Health Literacy (05/15/2023)    Marit Lark, MSW, LCSW Clinical Social Worker II [facility] Heart/Vascular Care Navigation  [phone]- work cell phone (preferred)

## 2024-03-21 NOTE — Telephone Encounter (Signed)
 H&V Care Navigation CSW Progress Note  Clinical Social Worker received call back from pt. She confirmed full name and DOB. She shares that she's having some lightheadedness and feels like she needs someone to be with her- when I asked if she meant right now or for intermittent help at home she states both. She shares she was told her prednisone  may make me jittery and checked her BP this morning and didn't find it worrisome. She's not sure if its her anxiety or something else. I shared for quickest response she needs to call 911. She is comfortable hanging up and doing so. Unfortunately, there is no way otherwise for her to have someone come see her,but can make sure she has personal care services resources sent to her home address. She states understanding, okay with me sending these concerns to RN team.   Patient is participating in a Managed Medicaid Plan:  No, Aetna commercial plan only  SDOH Screenings   Food Insecurity: Food Insecurity Present (03/12/2024)  Housing: Low Risk (03/12/2024)  Transportation Needs: No Transportation Needs (03/12/2024)  Utilities: Not At Risk (03/12/2024)  Alcohol Screen: Low Risk (05/15/2023)  Depression (PHQ2-9): Low Risk (03/18/2024)  Financial Resource Strain: High Risk (05/15/2023)  Physical Activity: Insufficiently Active (05/15/2023)  Social Connections: Unknown (03/12/2024)  Stress: Stress Concern Present (02/12/2023)  Tobacco Use: Medium Risk (03/20/2024)  Health Literacy: Adequate Health Literacy (05/15/2023)    Marit Lark, MSW, LCSW Clinical Social Worker II [facility] Heart/Vascular Care Navigation  [phone]- work cell phone (preferred)

## 2024-03-25 ENCOUNTER — Other Ambulatory Visit: Payer: Self-pay | Admitting: *Deleted

## 2024-03-25 NOTE — Transitions of Care (Post Inpatient/ED Visit) (Signed)
 " Transition of Care week 2  Visit Note  03/25/2024  Name: Wendy Jensen MRN: [id number]          DOB: 1960-03-15  Situation: Patient enrolled in [facility] 30-day program. Visit completed with Wendy Jensen by telephone.   Background:   Initial Transition Care Management Follow-up Telephone Call Discharge Date and Diagnosis: 03/15/24, Acute respiratory failure with hypoxia   Past Medical History:  Diagnosis Date   GERD (gastroesophageal reflux disease)    Hypertension    Peptic ulcer    Reflux     Assessment: Patient Reported Symptoms: Cognitive Cognitive Status: Able to follow simple commands, Alert and oriented to person, place, and time, Normal speech and language skills      Neurological Neurological Review of Symptoms: No symptoms reported    HEENT HEENT Symptoms Reported: No symptoms reported      Cardiovascular   Cardiovascular Comment: BP today 146/93 and 149/90. Denies chest pain  Respiratory Respiratory Symptoms Reported: No symptoms reported Additional Respiratory Details: Using oxygen as needed. Keeping oxygen sats in the 90s. Respiratory Management Strategies: Adequate rest, Oxygen therapy, Routine screening, Medication therapy, Coping strategies, Breathing techniques Respiratory Self-Management Outcome: 4 (good)  Endocrine Endocrine Symptoms Reported: No symptoms reported    Gastrointestinal Gastrointestinal Symptoms Reported: No symptoms reported      Genitourinary Genitourinary Symptoms Reported: No symptoms reported    Integumentary Integumentary Symptoms Reported: No symptoms reported    Musculoskeletal Musculoskelatal Symptoms Reviewed: No symptoms reported        Psychosocial Psychosocial Symptoms Reported: Not assessed         Today's Vitals   03/25/24 1448  BP: (!) 149/90  Pulse: 70  SpO2: 94%   Pain Score: 0-No pain  Medications Reviewed Today     Reviewed by Lucky Andrea LABOR, RN (Registered Nurse) on 03/25/24 at 1503  Med List  Status: <None>   Medication Order Taking? Sig Documenting Provider Last Dose Status Informant  albuterol  (VENTOLIN  HFA) 108 (90 Base) MCG/ACT inhaler [id number] Yes Inhale 2 puffs into the lungs every 4 (four) hours as needed for wheezing or shortness of breath. Adrien Guan, Tamala, MD  Active   budesonide -glycopyrrolate -formoterol  (BREZTRI  AEROSPHERE) 160-9-4.8 MCG/ACT AERO inhaler [id number] Yes Inhale 2 puffs into the lungs in the morning and at bedtime. Adrien Guan, Tamala, MD  Active   carvedilol  (COREG ) 6.25 MG tablet [id number] Yes Take 1 tablet (6.25 mg total) by mouth 2 (two) times daily with a meal. Follow with your PCP outpatient for refills and dose adjustment.  We reduced your dose because your heart rate was on the slower side.  Patient taking differently: Take 25 mg by mouth 2 (two) times daily with a meal. As directed by Cardiology on 03/18/24   Perri LABOR Meliton Mickey., MD  Active   chlorthalidone  (HYGROTON ) 25 MG tablet [id number] Yes Take 25 mg by mouth daily. [provider]  Active   folic acid  (FOLVITE ) 1 MG tablet [id number]  Take 1 tablet (1 mg total) by mouth daily.  Patient not taking: Reported on 03/25/2024   Darci Pore, MD  Active Self, Pharmacy Records  omeprazole  [facility]) 20 MG capsule [id number] Yes TAKE 1 CAPSULE(20 MG) BY MOUTH DAILY Tanda Bleacher, MD  Active Self, Pharmacy Records  rosuvastatin  (CRESTOR ) 20 MG tablet [id number] Yes Take 1 tablet (20 mg total) by mouth daily. Perri LABOR Meliton Mickey., MD  Active   thiamine  (VITAMIN B-1) 100 MG tablet [id number]  Take 1 tablet (100 mg total) by mouth daily.  Patient not taking: Reported on 03/25/2024   Darci Pore, MD  Active Self, Pharmacy Records            Recommendation:   Continue Current Plan of Care  Follow Up Plan:   Telephone follow-up in 1 week  Andrea Dimes RN, BSN [place]  Value-Based [facility] RN Care Manager [phone]     "

## 2024-03-25 NOTE — Telephone Encounter (Signed)
 Late entry: Spoke with pharmacy staff pt is to take medication as prescribed and monitor blood pressure until upcoming appointment. Pt verbalized understanding.

## 2024-03-25 NOTE — Patient Instructions (Signed)
 Visit Information  Thank you for taking time to visit with me today. Please don't hesitate to contact me if I can be of assistance to you before our next scheduled telephone appointment.   Following is a copy of your care plan:   Goals Addressed             This Visit's Progress    VBCI Transitions of Care (TOC) Care Plan       Problems:  Recent Hospitalization for treatment of Respiratory Failure No Specialist appointment referral to Pulmonology -has not been scheduled  Goal:  Over the next 30 days, the patient will not experience hospital readmission  Interventions:  Transitions of Care: Doctor Visits  - discussed the importance of doctor visits Post discharge activity limitations prescribed by provider reviewed Medication review-educated patient on having prescriptions transferred to pharmacy of choice Reviewed provider note from Pulmonology visit Reviewed BP Reviewed oxygen saturation Assisted with resetting MyChart password Advised taking all medications and BP readings to PCP appointment on 03/26/24  Patient Self Care Activities:  Attend all scheduled provider appointments Call provider office for new concerns or questions  Notify RN Care Manager of [facility] call rescheduling needs Participate in Transition of Care Program/Attend TOC scheduled calls Take medications as prescribed   check blood pressure daily write blood pressure results in a log or diary keep a blood pressure log keep all doctor appointments eat more whole grains, fruits and vegetables, lean meats and healthy fats  Plan:  Telephone follow up appointment with care management team member scheduled for:  04/01/24 at 3:15pm        Patient verbalizes understanding of instructions and care plan provided today and agrees to view in MyChart. Active MyChart status and patient understanding of how to access instructions and care plan via MyChart confirmed with patient.     Telephone follow up appointment  with care management team member scheduled for:04/01/24 at 3:15pm  Please call the care guide team at [phone] if you need to cancel or reschedule your appointment.   Please call [phone]-TALK (toll free, 24 hour hotline) go to [facility] [address], [place] [phone]) call 911 if you are experiencing a Mental Health or Behavioral Health Crisis or need someone to talk to.  Andrea Dimes RN, BSN [place]  Value-Based Care Institute [facility] RN Care Manager [phone]

## 2024-03-26 ENCOUNTER — Ambulatory Visit (INDEPENDENT_AMBULATORY_CARE_PROVIDER_SITE_OTHER): Admitting: Family Medicine

## 2024-03-26 ENCOUNTER — Encounter: Payer: Self-pay | Admitting: Family Medicine

## 2024-03-26 VITALS — BP 154/88 | HR 74 | Ht 65.0 in | Wt 140.8 lb

## 2024-03-26 DIAGNOSIS — I10 Essential (primary) hypertension: Secondary | ICD-10-CM

## 2024-03-26 DIAGNOSIS — Z09 Encounter for follow-up examination after completed treatment for conditions other than malignant neoplasm: Secondary | ICD-10-CM

## 2024-03-26 DIAGNOSIS — E782 Mixed hyperlipidemia: Secondary | ICD-10-CM | POA: Diagnosis not present

## 2024-03-26 DIAGNOSIS — F411 Generalized anxiety disorder: Secondary | ICD-10-CM | POA: Diagnosis not present

## 2024-03-26 MED ORDER — HYDROXYZINE HCL 25 MG PO TABS: 25.0000 mg | ORAL_TABLET | Freq: Three times a day (TID) | ORAL | 0 refills | Status: AC | PRN

## 2024-03-26 MED ORDER — FOLIC ACID 1 MG PO TABS: 1.0000 mg | ORAL_TABLET | Freq: Every day | ORAL | 1 refills | Status: AC

## 2024-03-26 MED ORDER — VITAMIN B-1 100 MG PO TABS: 100.0000 mg | ORAL_TABLET | Freq: Every day | ORAL | 1 refills | Status: AC

## 2024-03-27 ENCOUNTER — Telehealth: Payer: Self-pay

## 2024-03-27 ENCOUNTER — Encounter: Payer: Self-pay | Admitting: Family Medicine

## 2024-03-27 NOTE — Progress Notes (Signed)
 "  Established Patient Office Visit  Subjective    Patient ID: Wendy Jensen, female    DOB: Jun 10, 1960  Age: 64 y.o. MRN: [id number]  CC:  Chief Complaint  Patient presents with   Hospitalization Follow-up    Pt reports she has been having panic attacks. Happens mostly when she is at home by herself. Feels like she can't catch her breath.     HPI Wendy Jensen presents for routine hospital discharge follow-up where patient was dx with acute respiratory failure. Patient reports that she is doing well except having panic attacks since discharge.    Outpatient Encounter Medications as of 03/26/2024  Medication Sig   chlorthalidone  (HYGROTON ) 25 MG tablet Take 25 mg by mouth daily.   hydrOXYzine  (ATARAX ) 25 MG tablet Take 1 tablet (25 mg total) by mouth 3 (three) times daily as needed for anxiety.   omeprazole  (PRILOSEC) 20 MG capsule TAKE 1 CAPSULE(20 MG) BY MOUTH DAILY   rosuvastatin  (CRESTOR ) 20 MG tablet Take 1 tablet (20 mg total) by mouth daily.   albuterol  (VENTOLIN  HFA) 108 (90 Base) MCG/ACT inhaler Inhale 2 puffs into the lungs every 4 (four) hours as needed for wheezing or shortness of breath.   budesonide -glycopyrrolate -formoterol  (BREZTRI  AEROSPHERE) 160-9-4.8 MCG/ACT AERO inhaler Inhale 2 puffs into the lungs in the morning and at bedtime.   carvedilol  (COREG ) 6.25 MG tablet Take 1 tablet (6.25 mg total) by mouth 2 (two) times daily with a meal. Follow with your PCP outpatient for refills and dose adjustment.  We reduced your dose because your heart rate was on the slower side. (Patient taking differently: Take 25 mg by mouth 2 (two) times daily with a meal. As directed by Cardiology on 03/18/24)   folic acid  (FOLVITE ) 1 MG tablet Take 1 tablet (1 mg total) by mouth daily.   thiamine  (VITAMIN B-1) 100 MG tablet Take 1 tablet (100 mg total) by mouth daily.   [DISCONTINUED] folic acid  (FOLVITE ) 1 MG tablet Take 1 tablet (1 mg total) by mouth daily. (Patient not taking:  Reported on 03/25/2024)   [DISCONTINUED] thiamine  (VITAMIN B-1) 100 MG tablet Take 1 tablet (100 mg total) by mouth daily. (Patient not taking: Reported on 03/25/2024)   No facility-administered encounter medications on file as of 03/26/2024.    Past Medical History:  Diagnosis Date   GERD (gastroesophageal reflux disease)    Hypertension    Peptic ulcer    Reflux     Past Surgical History:  Procedure Laterality Date   COLONOSCOPY     ESOPHAGEAL DILATION     TUBAL LIGATION     UPPER GI ENDOSCOPY      Family History  Problem Relation Age of Onset   Hypertension Father     Social History   Socioeconomic History   Marital status: Single    Spouse name: Not on file   Number of children: Not on file   Years of education: Not on file   Highest education level: Not on file  Occupational History   Not on file  Tobacco Use   Smoking status: Former    Current packs/day: 0.00    Average packs/day: 0.2 packs/day    Types: Cigarettes    Quit date: 09/10/2018    Years since quitting: 5.5   Smokeless tobacco: Never  Vaping Use   Vaping status: Never Used  Substance and Sexual Activity   Alcohol use: Yes    Comment: 1 glass beer per day   Drug use: No  Sexual activity: Not Currently  Other Topics Concern   Not on file  Social History Narrative   Not on file   Social Drivers of Health   Tobacco Use: Medium Risk (03/26/2024)   Patient History    Smoking Tobacco Use: Former    Smokeless Tobacco Use: Never    Passive Exposure: Not on file  Financial Resource Strain: High Risk (05/15/2023)   Overall Financial Resource Strain (CARDIA)    Difficulty of Paying Living Expenses: Very hard  Food Insecurity: No Food Insecurity (03/25/2024)   Epic    Worried About Programme Researcher, Broadcasting/film/video in the Last Year: Never true    Ran Out of Food in the Last Year: Never true  Recent Concern: Food Insecurity - Food Insecurity Present (03/12/2024)   Epic    Worried About Programme Researcher, Broadcasting/film/video in the  Last Year: Often true    Ran Out of Food in the Last Year: Often true  Transportation Needs: No Transportation Needs (03/25/2024)   Epic    Lack of Transportation (Medical): No    Lack of Transportation (Non-Medical): No  Physical Activity: Insufficiently Active (05/15/2023)   Exercise Vital Sign    Days of Exercise per Week: 3 days    Minutes of Exercise per Session: 30 min  Stress: Stress Concern Present (02/12/2023)   [employer] of Occupational Health - Occupational Stress Questionnaire    Feeling of Stress : Very much  Social Connections: Unknown (03/12/2024)   Social Connection and Isolation Panel    Frequency of Communication with Friends and Family: Not on file    Frequency of Social Gatherings with Friends and Family: Not on file    Attends Religious Services: Not on file    Active Member of Clubs or Organizations: Not on file    Attends Banker Meetings: Not on file    Marital Status: Living with partner  Intimate Partner Violence: Not At Risk (03/25/2024)   Epic    Fear of Current or Ex-Partner: No    Emotionally Abused: No    Physically Abused: No    Sexually Abused: No  Depression (PHQ2-9): Low Risk (03/18/2024)   Depression (PHQ2-9)    PHQ-2 Score: 0  Alcohol Screen: Low Risk (05/15/2023)   Alcohol Screen    Last Alcohol Screening Score (AUDIT): 3  Housing: Unknown (03/25/2024)   Epic    Unable to Pay for Housing in the Last Year: No    Number of Times Moved in the Last Year: Not on file    Homeless in the Last Year: No  Utilities: Not At Risk (03/25/2024)   Epic    Threatened with loss of utilities: No  Health Literacy: Adequate Health Literacy (05/15/2023)   B1300 Health Literacy    Frequency of need for help with medical instructions: Never    Review of Systems  Psychiatric/Behavioral:  The patient is nervous/anxious.   All other systems reviewed and are negative.       Objective    BP (!) 154/88   Pulse 74   Ht 5' 5 (1.651 m)   Wt  140 lb 12.8 oz (63.9 kg)   SpO2 92%   BMI 23.43 kg/m   Physical Exam Vitals and nursing note reviewed.  Constitutional:      General: She is not in acute distress. Cardiovascular:     Rate and Rhythm: Normal rate and regular rhythm.  Pulmonary:     Effort: Pulmonary effort is normal.  Breath sounds: Normal breath sounds.  Abdominal:     Palpations: Abdomen is soft.     Tenderness: There is no abdominal tenderness.  Musculoskeletal:     Right lower leg: No edema.     Left lower leg: No edema.  Neurological:     General: No focal deficit present.     Mental Status: She is alert and oriented to person, place, and time.         Assessment & Plan:   Essential hypertension -     Comprehensive metabolic panel with GFR -     CBC with Differential/Platelet  Mixed hyperlipidemia  Anxiety state  Hospital discharge follow-up  Other orders -     Vitamin B-1; Take 1 tablet (100 mg total) by mouth daily.  Dispense: 30 tablet; Refill: 1 -     Folic Acid ; Take 1 tablet (1 mg total) by mouth daily.  Dispense: 30 tablet; Refill: 1 -     hydrOXYzine  HCl; Take 1 tablet (25 mg total) by mouth 3 (three) times daily as needed for anxiety.  Dispense: 30 tablet; Refill: 0     Return in about 6 months (around 09/23/2024).   Tanda Raguel SQUIBB, MD  "

## 2024-03-27 NOTE — Telephone Encounter (Signed)
 Copied from CRM #[id number]. Topic: General - Other >> Mar 24, 2024  1:24 PM Corean SAUNDERS wrote: Reason for CRM: Patient is calling to confirm that her FMLA paperwork has been received as it was faxed 1/9 >> Mar 27, 2024  9:22 AM Char D wrote: Message route to Triage for review  >> Mar 27, 2024  8:16 AM Benton KIDD wrote: Patient is calling to check in on her fmla papers and to see have they been sent back to her job

## 2024-03-27 NOTE — Telephone Encounter (Signed)
 New message    Communication  Reason for CRM: Patient is calling to confirm that her FMLA paperwork has been received as it was faxed 1/9

## 2024-03-28 ENCOUNTER — Ambulatory Visit: Admitting: Cardiology

## 2024-03-28 NOTE — Telephone Encounter (Signed)
 I checked up front and also A pod and there are no forms on this pt.  Wendy Jensen or Dr. Adrien, do you already have the forms?

## 2024-03-31 NOTE — Telephone Encounter (Signed)
 Copied from CRM [id number]. Topic: General - Other >> Mar 31, 2024 10:09 AM Dedra B wrote: Reason for CRM: Patient is calling to check the status of her FMLA paperwork. Please call patient.

## 2024-04-01 ENCOUNTER — Other Ambulatory Visit: Payer: Self-pay | Admitting: *Deleted

## 2024-04-01 NOTE — Patient Instructions (Signed)
 Visit Information  Thank you for taking time to visit with me today. Please don't hesitate to contact me if I can be of assistance to you before our next scheduled telephone appointment.   Following is a copy of your care plan:   Goals Addressed             This Visit's Progress    [facility] Transitions of Care (TOC) Care Plan       Problems:  Recent Hospitalization for treatment of Respiratory Failure No Specialist appointment referral to Pulmonology -has not been scheduled  Goal:  Over the next 30 days, the patient will not experience hospital readmission  Interventions:  Transitions of Care: Doctor Visits  - discussed the importance of doctor visits Post discharge activity limitations prescribed by provider reviewed Medication review Reviewed BP Reviewed oxygen saturation Advised taking all medications and BP readings to Cardiology appointment on 04/08/24  Patient Self Care Activities:  Attend all scheduled provider appointments Call provider office for new concerns or questions  Notify RN Care Manager of [facility] call rescheduling needs Participate in Transition of Care Program/Attend TOC scheduled calls Take medications as prescribed   check blood pressure daily write blood pressure results in a log or diary keep a blood pressure log keep all doctor appointments eat more whole grains, fruits and vegetables, lean meats and healthy fats  Plan:  Telephone follow up appointment with care management team member scheduled for:  04/08/24 at 3:45pm        Patient verbalizes understanding of instructions and care plan provided today and agrees to view in MyChart. Active MyChart status and patient understanding of how to access instructions and care plan via MyChart confirmed with patient.     Telephone follow up appointment with care management team member scheduled for:04/08/24 at 3:45pm  Please call the care guide team at [phone] if you need to cancel or reschedule your  appointment.   Please call [phone]-TALK (toll free, 24 hour hotline) go to [facility] [address] [place] [phone]) call 911 if you are experiencing a Mental Health or Behavioral Health Crisis or need someone to talk to.  Andrea Dimes RN, BSN [place]  [facility] RN Care Manager [phone]

## 2024-04-01 NOTE — Transitions of Care (Post Inpatient/ED Visit) (Signed)
 " Transition of Care week 3  Visit Note  04/01/2024  Name: Wendy Jensen MRN: [id number]          DOB: May 07, 1960  Situation: Patient enrolled in [facility] 30-day program. Visit completed with Ms. Mancinas by telephone.   Background:   Initial Transition Care Management Follow-up Telephone Call Discharge Date and Diagnosis: 03/15/24, Acute respiratory failure with hypoxia   Past Medical History:  Diagnosis Date   GERD (gastroesophageal reflux disease)    Hypertension    Peptic ulcer    Reflux     Assessment: Patient Reported Symptoms: Cognitive Cognitive Status: Able to follow simple commands, Alert and oriented to person, place, and time, Normal speech and language skills      Neurological Neurological Review of Symptoms: No symptoms reported    HEENT HEENT Symptoms Reported: No symptoms reported      Cardiovascular Cardiovascular Symptoms Reported: No symptoms reported Cardiovascular Self-Management Outcome: 3 (uncertain) Cardiovascular Comment: BP today 158/94. Patient reports taking BP medications as directed. Reviewed the importance of diet and exercise. Patient has follow up with cardiology on 04/08/24 and will take BP log to this appointment.  Respiratory Respiratory Symptoms Reported: Dry cough Additional Respiratory Details: no longer using oxygen all of the time, last oxygen sat was 96% Respiratory Self-Management Outcome: 4 (good)  Endocrine Endocrine Symptoms Reported: Not assessed    Gastrointestinal Gastrointestinal Symptoms Reported: No symptoms reported      Genitourinary Genitourinary Symptoms Reported: No symptoms reported    Integumentary Integumentary Symptoms Reported: No symptoms reported    Musculoskeletal Musculoskelatal Symptoms Reviewed: No symptoms reported        Psychosocial Psychosocial Symptoms Reported: Not assessed         Today's Vitals   04/01/24 1532  BP: (!) 158/94   Pain Score: 0-No pain  Medications Reviewed Today      Reviewed by Lucky Andrea LABOR, RN (Registered Nurse) on 04/01/24 at 1527  Med List Status: <None>   Medication Order Taking? Sig Documenting Provider Last Dose Status Informant  albuterol  (VENTOLIN  HFA) 108 (90 Base) MCG/ACT inhaler [id number] Yes Inhale 2 puffs into the lungs every 4 (four) hours as needed for wheezing or shortness of breath. Adrien Guan, Tamala, MD  Active   budesonide -glycopyrrolate -formoterol  (BREZTRI  AEROSPHERE) 160-9-4.8 MCG/ACT AERO inhaler [id number] Yes Inhale 2 puffs into the lungs in the morning and at bedtime. Adrien Guan, Tamala, MD  Active   carvedilol  (COREG ) 6.25 MG tablet [id number] Yes Take 1 tablet (6.25 mg total) by mouth 2 (two) times daily with a meal. Follow with your PCP outpatient for refills and dose adjustment.  We reduced your dose because your heart rate was on the slower side.  Patient taking differently: Take 25 mg by mouth 2 (two) times daily with a meal. As directed by Cardiology on 03/18/24   Perri LABOR Meliton Mickey., MD  Active   chlorthalidone  (HYGROTON ) 25 MG tablet [id number] Yes Take 25 mg by mouth daily. [provider]  Active   folic acid  (FOLVITE ) 1 MG tablet [id number] Yes Take 1 tablet (1 mg total) by mouth daily. Tanda Bleacher, MD  Active   hydrOXYzine  (ATARAX ) 25 MG tablet [id number]  Take 1 tablet (25 mg total) by mouth 3 (three) times daily as needed for anxiety.  Patient not taking: Reported on 04/01/2024   Tanda Bleacher, MD  Active   omeprazole  (PRILOSEC) 20 MG capsule [id number] Yes TAKE 1 CAPSULE(20 MG) BY MOUTH DAILY Tanda Bleacher, MD  Active Self, Pharmacy Records  rosuvastatin  (  CRESTOR ) 20 MG tablet [id number] Yes Take 1 tablet (20 mg total) by mouth daily. Perri DELENA Meliton Mickey., MD  Active   thiamine  (VITAMIN B-1) 100 MG tablet [id number] Yes Take 1 tablet (100 mg total) by mouth daily. Tanda Bleacher, MD  Active             Recommendation:   Continue Current Plan of Care  Follow Up Plan:   Telephone  follow-up in 1 week  Andrea Dimes RN, BSN [place]  [facility] RN Care Manager [phone]     "

## 2024-04-02 ENCOUNTER — Telehealth: Payer: Self-pay | Admitting: Licensed Clinical Social Worker

## 2024-04-02 ENCOUNTER — Telehealth: Payer: Self-pay

## 2024-04-02 NOTE — Progress Notes (Signed)
 " Cardiology Office Note:    Date:  04/05/2024   ID:  Wendy Jensen, DOB [birth year], MRN [id number]  PCP:  Wendy Bleacher, MD  Cardiologist:  Wendy LITTIE Nanas, MD  Electrophysiologist:  None   Referring MD: Wendy Bleacher, MD   Chief Complaint  Patient presents with   Hypertension    History of Present Illness:    Wendy Jensen is a 64 y.o. female with a hx of hypertension, PUD, hyperlipidemia who presents for follow-up.  She was referred by Dr. Prentiss for evaluation of hypertension, initially seen on 04/05/2020.   Echocardiogram 03/12/2024 shows EF 50 to 55%, moderate LVH, normal RV function, no significant valvular disease.  Since last clinic visit, she reports she is doing okay.  Had a recent hospitalization for COPD exacerbation, her carvedilol  was reduced due to bradycardia.  Since discharge from the hospital she went back to taking 25 mg twice daily of carvedilol  and chlorthalidone  25 mg daily.  Reports she did not take chlorthalidone  this morning.  She denies any chest pain.  Reports her dyspnea has improved.  Reports some lightheadedness but denies any syncope.  Denies any lower extremity edema or palpitations.    Past Medical History:  Diagnosis Date   GERD (gastroesophageal reflux disease)    Hypertension    Peptic ulcer    Reflux     Past Surgical History:  Procedure Laterality Date   COLONOSCOPY     ESOPHAGEAL DILATION     TUBAL LIGATION     UPPER GI ENDOSCOPY      Current Medications: Current Meds  Medication Sig   albuterol  (VENTOLIN  HFA) 108 (90 Base) MCG/ACT inhaler Inhale 2 puffs into the lungs every 4 (four) hours as needed for wheezing or shortness of breath.   budesonide -glycopyrrolate -formoterol  (BREZTRI  AEROSPHERE) 160-9-4.8 MCG/ACT AERO inhaler Inhale 2 puffs into the lungs in the morning and at bedtime.   carvedilol  (COREG ) 25 MG tablet Take 1 tablet (25 mg total) by mouth 2 (two) times daily.   chlorthalidone  (HYGROTON ) 25 MG  tablet Take 25 mg by mouth daily.   folic acid  (FOLVITE ) 1 MG tablet Take 1 tablet (1 mg total) by mouth daily.   hydrOXYzine  (ATARAX ) 25 MG tablet Take 1 tablet (25 mg total) by mouth 3 (three) times daily as needed for anxiety.   omeprazole  (PRILOSEC) 20 MG capsule TAKE 1 CAPSULE(20 MG) BY MOUTH DAILY   rosuvastatin  (CRESTOR ) 20 MG tablet Take 1 tablet (20 mg total) by mouth daily.   thiamine  (VITAMIN B-1) 100 MG tablet Take 1 tablet (100 mg total) by mouth daily.   [DISCONTINUED] carvedilol  (COREG ) 6.25 MG tablet Take 1 tablet (6.25 mg total) by mouth 2 (two) times daily with a meal. Follow with your PCP outpatient for refills and dose adjustment.  We reduced your dose because your heart rate was on the slower side. (Patient taking differently: Take 25 mg by mouth 2 (two) times daily with a meal. As directed by Cardiology on 03/18/24)     Allergies:   Amlodipine  benzoate, Amlodipine  besylate, Hydralazine , Hydrochlorothiazide , Lisinopril, Losartan, Metoprolol tartrate, and Spironolactone    Social History   Socioeconomic History   Marital status: Single    Spouse name: Not on file   Number of children: Not on file   Years of education: Not on file   Highest education level: Not on file  Occupational History   Not on file  Tobacco Use   Smoking status: Former    Current packs/day: 0.00  Average packs/day: 0.2 packs/day    Types: Cigarettes    Quit date: 09/10/2018    Years since quitting: 5.5   Smokeless tobacco: Never  Vaping Use   Vaping status: Never Used  Substance and Sexual Activity   Alcohol use: Yes    Comment: 1 glass beer per day   Drug use: No   Sexual activity: Not Currently  Other Topics Concern   Not on file  Social History Narrative   Not on file   Social Drivers of Health   Tobacco Use: Medium Risk (04/04/2024)   Patient History    Smoking Tobacco Use: Former    Smokeless Tobacco Use: Never    Passive Exposure: Not on file  Financial Resource Strain: High  Risk (05/15/2023)   Overall Financial Resource Strain (CARDIA)    Difficulty of Paying Living Expenses: Very hard  Food Insecurity: No Food Insecurity (03/25/2024)   Epic    Worried About Programme Researcher, Broadcasting/film/video in the Last Year: Never true    Ran Out of Food in the Last Year: Never true  Recent Concern: Food Insecurity - Food Insecurity Present (03/12/2024)   Epic    Worried About Programme Researcher, Broadcasting/film/video in the Last Year: Often true    Ran Out of Food in the Last Year: Often true  Transportation Needs: No Transportation Needs (03/25/2024)   Epic    Lack of Transportation (Medical): No    Lack of Transportation (Non-Medical): No  Physical Activity: Insufficiently Active (05/15/2023)   Exercise Vital Sign    Days of Exercise per Week: 3 days    Minutes of Exercise per Session: 30 min  Stress: Stress Concern Present (02/12/2023)   [employer] of Occupational Health - Occupational Stress Questionnaire    Feeling of Stress : Very much  Social Connections: Unknown (03/12/2024)   Social Connection and Isolation Panel    Frequency of Communication with Friends and Family: Not on file    Frequency of Social Gatherings with Friends and Family: Not on file    Attends Religious Services: Not on file    Active Member of Clubs or Organizations: Not on file    Attends Banker Meetings: Not on file    Marital Status: Living with partner  Depression (PHQ2-9): Low Risk (03/18/2024)   Depression (PHQ2-9)    PHQ-2 Score: 0  Alcohol Screen: Low Risk (05/15/2023)   Alcohol Screen    Last Alcohol Screening Score (AUDIT): 3  Housing: Unknown (03/25/2024)   Epic    Unable to Pay for Housing in the Last Year: No    Number of Times Moved in the Last Year: Not on file    Homeless in the Last Year: No  Utilities: Not At Risk (03/25/2024)   Epic    Threatened with loss of utilities: No  Health Literacy: Adequate Health Literacy (05/15/2023)   B1300 Health Literacy    Frequency of need for help with  medical instructions: Never     Family History: The patient's family history includes Hypertension in her father.  ROS:   Please see the history of present illness.     All other systems reviewed and are negative.  EKGs/Labs/Other Studies Reviewed:    The following studies were reviewed today:   EKG:  EKG is ordered today.  The ekg ordered today demonstrates sinus rhythm, rate 65, first-degree AV block, nonspecific T wave flattening, QTC 418  Recent Labs: 03/12/2024: Pro Brain Natriuretic Peptide 1,981.0 03/14/2024: ALT 16; Hemoglobin  12.7; Platelets 290 04/04/2024: BUN 17; Creatinine, Ser 0.80; Magnesium  2.1; Potassium 3.8; Sodium 140  Recent Lipid Panel    Component Value Date/Time   CHOL 234 (H) 04/04/2024 1243   TRIG 92 04/04/2024 1243   HDL 91 04/04/2024 1243   CHOLHDL 2.6 04/04/2024 1243   LDLCALC 127 (H) 04/04/2024 1243   LDLDIRECT 179 (H) 06/24/2019 1412    Physical Exam:    VS:  BP (!) 158/90 (BP Location: Left Arm, Patient Position: Sitting, Cuff Size: Normal)   Pulse 78   Ht 5' 5 (1.651 m)   Wt 139 lb 3.2 oz (63.1 kg)   SpO2 92%   BMI 23.16 kg/m     Wt Readings from Last 3 Encounters:  04/04/24 139 lb 3.2 oz (63.1 kg)  03/26/24 140 lb 12.8 oz (63.9 kg)  03/20/24 137 lb 9.6 oz (62.4 kg)     GEN:  in no acute distress HEENT: Normal NECK: No JVD; No carotid bruits LYMPHATICS: No lymphadenopathy CARDIAC: RRR, no murmurs, rubs, gallops RESPIRATORY:  Clear to auscultation without rales, wheezing or rhonchi  ABDOMEN: Soft, non-tender, non-distended MUSCULOSKELETAL:  No edema; No deformity  SKIN: Warm and dry NEUROLOGIC:  Alert and oriented x 3 PSYCHIATRIC:  Normal affect   ASSESSMENT:    1. Essential hypertension   2. Bradycardia   3. Hyperlipidemia, unspecified hyperlipidemia type     PLAN:    Hypertension: Allergies to multiple antihypertensives.  Currently on carvedilol  25 mg twice daily and chlorthalidone  25 mg daily.  BP elevated in clinic  today but did not take her chlorthalidone .  Asked to check BP twice daily and will schedule in pharmacy hypertension clinic.  Asked to bring her home BP log and monitor to calibrate to appointment  Bradycardia: Reportedly had low heart rate during hospitalization and carvedilol  was reduced.  Since discharge she is back to taking her normal carvedilol  25 mg twice daily, will check Zio patch x 7 days to evaluate for bradycardia  Hyperlipidemia: On rosuvastatin  20 mg daily.  Update lipid panel  Chest pain: Description suggest noncardiac chest pain, as describes eft-sided chest pain that lasts for few seconds and resolves.  No further cardiac work-up recommended at this time  RTC in 4 months  Medication Adjustments/Labs and Tests Ordered: Current medicines are reviewed at length with the patient today.  Concerns regarding medicines are outlined above.  Orders Placed This Encounter  Procedures   Basic Metabolic Panel (BMET)   Magnesium    Lipid panel   AMB Referral to [facility] Pharm-D   LONG TERM MONITOR (3-14 DAYS)   No orders of the defined types were placed in this encounter.   Patient Instructions  Medication Instructions:  Your physician recommends that you continue on your current medications as directed. Please refer to the Current Medication list given to you today.  *If you need a refill on your cardiac medications before your next appointment, please call your pharmacy*  Lab Work: Bmet, mg, lipid panel today If you have labs (blood work) drawn today and your tests are completely normal, you will receive your results only by: MyChart Message (if you have MyChart) OR A paper copy in the mail If you have any lab test that is abnormal or we need to change your treatment, we will call you to review the results.  Testing/Procedures: Zio  ZIO XT- Long Term Monitor Instructions  Your physician has requested you wear a ZIO patch monitor for 7 days.  This is a single patch  monitor. [employer] supplies one patch monitor per enrollment. Additional stickers are not available. Please do not apply patch if you will be having a Nuclear Stress Test,  Echocardiogram, Cardiac CT, MRI, or Chest Xray during the period you would be wearing the  monitor. The patch cannot be worn during these tests. You cannot remove and re-apply the  ZIO XT patch monitor.  Your ZIO patch monitor will be mailed 3 day USPS to your address on file. It may take 3-5 days  to receive your monitor after you have been enrolled.  Once you have received your monitor, please review the enclosed instructions. Your monitor  has already been registered assigning a specific monitor serial # to you.  Billing and Patient Assistance Program Information  We have supplied [employer] with any of your insurance information on file for billing purposes. [employer] offers a sliding scale Patient Assistance Program for patients that do not have  insurance, or whose insurance does not completely cover the cost of the ZIO monitor.  You must apply for the Patient Assistance Program to qualify for this discounted rate.  To apply, please call [employer] at [phone], select option 4, select option 2, ask to apply for  Patient Assistance Program. Meredeth will ask your household income, and how many people  are in your household. They will quote your out-of-pocket cost based on that information.  [employer] will also be able to set up a 85-month, interest-free payment plan if needed.  Applying the monitor   Shave hair from upper left chest.  Hold abrader disc by orange tab. Rub abrader in 40 strokes over the upper left chest as  indicated in your monitor instructions.  Clean area with 4 enclosed alcohol pads. Let dry.  Apply patch as indicated in monitor instructions. Patch will be placed under collarbone on left  side of chest with arrow pointing upward.  Rub patch adhesive wings for 2 minutes. Remove white label marked 1.  Remove the white  label marked 2. Rub patch adhesive wings for 2 additional minutes.  While looking in a mirror, press and release button in center of patch. A small green light will  flash 3-4 times. This will be your only indicator that the monitor has been turned on.  Do not shower for the first 24 hours. You may shower after the first 24 hours.  Press the button if you feel a symptom. You will hear a small click. Record Date, Time and  Symptom in the Patient Logbook.  When you are ready to remove the patch, follow instructions on the last 2 pages of Patient  Logbook. Stick patch monitor onto the last page of Patient Logbook.  Place Patient Logbook in the blue and white box. Use locking tab on box and tape box closed  securely. The blue and white box has prepaid postage on it. Please place it in the mailbox as  soon as possible. Your physician should have your test results approximately 7 days after the  monitor has been mailed back to [facility].  Call [facility] Customer Care at [phone] if you have questions regarding  your ZIO XT patch monitor. Call them immediately if you see an orange light blinking on your  monitor.  If your monitor falls off in less than 4 days, contact our Monitor department at [phone].  If your monitor becomes loose or falls off after 4 days call [employer] at [phone] for  suggestions on securing your monitor   Follow-Up: At [facility], you and your health needs are our priority.  As part of our continuing mission to provide you with exceptional heart care, our providers are all part of one team.  This team includes your primary Cardiologist (physician) and Advanced Practice Providers or APPs (Physician Assistants and Nurse Practitioners) who all work together to provide you with the care you need, when you need it.  Your next appointment:    4 months  Provider:   Dr. Kate   We recommend signing up for the patient  portal called MyChart.  Sign up information is provided on this After Visit Summary.  MyChart is used to connect with patients for Virtual Visits (Telemedicine).  Patients are able to view lab/test results, encounter notes, upcoming appointments, etc.  Non-urgent messages can be sent to your provider as well.   To learn more about what you can do with MyChart, go to [email or web address].   Other Instructions Referral to Pharm D Please check blood pressure twice a day for one week and send those readings            Signed, Wendy LITTIE Kate, MD  04/05/2024 9:02 PM    [facility] "

## 2024-04-02 NOTE — Telephone Encounter (Signed)
 We received the Fax from [employer] for [name]. Placed in Dr. Margaretann mailbox.

## 2024-04-02 NOTE — Telephone Encounter (Signed)
 H&V Care Navigation CSW Progress Note  Clinical Social Worker contacted patient by phone to f/u on patient resources mailed to her. Confirmed she received them, she shares appreciation for those and the call. Today she feels she is doing okay. She is aware of appt next week, confirmed date and time with her. Encouraged her to bring medications and a log of BP so they can see if any cardiac meds may have been making her feel unwell.    Patient is participating in a Managed Medicaid Plan:  No, Engineer, building services  SDOH Screenings   Food Insecurity: No Food Insecurity (03/25/2024)  Recent Concern: Food Insecurity - Food Insecurity Present (03/12/2024)  Housing: Unknown (03/25/2024)  Transportation Needs: No Transportation Needs (03/25/2024)  Utilities: Not At Risk (03/25/2024)  Alcohol Screen: Low Risk (05/15/2023)  Depression (PHQ2-9): Low Risk (03/18/2024)  Financial Resource Strain: High Risk (05/15/2023)  Physical Activity: Insufficiently Active (05/15/2023)  Social Connections: Unknown (03/12/2024)  Stress: Stress Concern Present (02/12/2023)  Tobacco Use: Medium Risk (03/27/2024)  Health Literacy: Adequate Health Literacy (05/15/2023)    Marit Lark, MSW, LCSW Clinical Social Worker II [facility] Heart/Vascular Care Navigation  [phone]- work cell phone (preferred)

## 2024-04-02 NOTE — Telephone Encounter (Signed)
 Called and spoke to patient and pt able to given blood pressure. Per patient 1/21 blood pressure 179/90.Patient denies headaches, lightheadedness or dizziness .SABRA Per pt seem blood pressure medication is not working.  Pt would like appt with provider and appt made. ED precautions reviewed with patient and patient verbalized an understanding.

## 2024-04-02 NOTE — Telephone Encounter (Signed)
 Copied from CRM #[id number]. Topic: General - Other >> Apr 02, 2024  8:45 AM Cherylann RAMAN wrote: Reason for CRM: Patient requesting an update on her FMLA paperwork. Advised patient of turn around time for FMLA paper work. Patient states that she turned it in on 03/21/24. Please advise. Patient can be contacted at [phone].

## 2024-04-04 ENCOUNTER — Ambulatory Visit

## 2024-04-04 ENCOUNTER — Ambulatory Visit: Attending: Cardiology | Admitting: Cardiology

## 2024-04-04 ENCOUNTER — Encounter: Payer: Self-pay | Admitting: Cardiology

## 2024-04-04 VITALS — BP 158/90 | HR 78 | Ht 65.0 in | Wt 139.2 lb

## 2024-04-04 DIAGNOSIS — I10 Essential (primary) hypertension: Secondary | ICD-10-CM | POA: Diagnosis not present

## 2024-04-04 DIAGNOSIS — R001 Bradycardia, unspecified: Secondary | ICD-10-CM | POA: Diagnosis not present

## 2024-04-04 DIAGNOSIS — E785 Hyperlipidemia, unspecified: Secondary | ICD-10-CM

## 2024-04-04 LAB — BASIC METABOLIC PANEL WITH GFR
BUN/Creatinine Ratio: 21 (ref 12–28)
BUN: 17 mg/dL (ref 8–27)
CO2: 27 mmol/L (ref 20–29)
Calcium: 9.6 mg/dL (ref 8.7–10.3)
Chloride: 97 mmol/L (ref 96–106)
Creatinine, Ser: 0.8 mg/dL (ref 0.57–1.00)
Glucose: 93 mg/dL (ref 70–99)
Potassium: 3.8 mmol/L (ref 3.5–5.2)
Sodium: 140 mmol/L (ref 134–144)
eGFR: 83 mL/min/1.73

## 2024-04-04 LAB — LIPID PANEL
Chol/HDL Ratio: 2.6 ratio (ref 0.0–4.4)
Cholesterol, Total: 234 mg/dL — ABNORMAL HIGH (ref 100–199)
HDL: 91 mg/dL
LDL Chol Calc (NIH): 127 mg/dL — ABNORMAL HIGH (ref 0–99)
Triglycerides: 92 mg/dL (ref 0–149)
VLDL Cholesterol Cal: 16 mg/dL (ref 5–40)

## 2024-04-04 LAB — MAGNESIUM: Magnesium: 2.1 mg/dL (ref 1.6–2.3)

## 2024-04-04 NOTE — Progress Notes (Unsigned)
 Enrolled patient for a 7 day Zio XT monitor to be mailed to patients home.

## 2024-04-04 NOTE — Telephone Encounter (Signed)
 Copied from CRM #[id number]. Topic: General - Other >> Apr 04, 2024 10:54 AM Joesph PARAS wrote: Reason for CRM: Patient is calling to request that Dr. Adrien call her ASAP, as it is important to patient (regarding FMLA).

## 2024-04-04 NOTE — Patient Instructions (Addendum)
 Medication Instructions:  Your physician recommends that you continue on your current medications as directed. Please refer to the Current Medication list given to you today.  *If you need a refill on your cardiac medications before your next appointment, please call your pharmacy*  Lab Work: Bmet, mg, lipid panel today If you have labs (blood work) drawn today and your tests are completely normal, you will receive your results only by: MyChart Message (if you have MyChart) OR A paper copy in the mail If you have any lab test that is abnormal or we need to change your treatment, we will call you to review the results.  Testing/Procedures: Zio  ZIO XT- Long Term Monitor Instructions  Your physician has requested you wear a ZIO patch monitor for 7 days.  This is a single patch monitor. [employer] supplies one patch monitor per enrollment. Additional stickers are not available. Please do not apply patch if you will be having a Nuclear Stress Test,  Echocardiogram, Cardiac CT, MRI, or Chest Xray during the period you would be wearing the  monitor. The patch cannot be worn during these tests. You cannot remove and re-apply the  ZIO XT patch monitor.  Your ZIO patch monitor will be mailed 3 day USPS to your address on file. It may take 3-5 days  to receive your monitor after you have been enrolled.  Once you have received your monitor, please review the enclosed instructions. Your monitor  has already been registered assigning a specific monitor serial # to you.  Billing and Patient Assistance Program Information  We have supplied [employer] with any of your insurance information on file for billing purposes. [employer] offers a sliding scale Patient Assistance Program for patients that do not have  insurance, or whose insurance does not completely cover the cost of the ZIO monitor.  You must apply for the Patient Assistance Program to qualify for this discounted rate.  To apply, please call  [employer] at [phone], select option 4, select option 2, ask to apply for  Patient Assistance Program. Meredeth will ask your household income, and how many people  are in your household. They will quote your out-of-pocket cost based on that information.  [employer] will also be able to set up a 82-month, interest-free payment plan if needed.  Applying the monitor   Shave hair from upper left chest.  Hold abrader disc by orange tab. Rub abrader in 40 strokes over the upper left chest as  indicated in your monitor instructions.  Clean area with 4 enclosed alcohol pads. Let dry.  Apply patch as indicated in monitor instructions. Patch will be placed under collarbone on left  side of chest with arrow pointing upward.  Rub patch adhesive wings for 2 minutes. Remove white label marked 1. Remove the white  label marked 2. Rub patch adhesive wings for 2 additional minutes.  While looking in a mirror, press and release button in center of patch. A small green light will  flash 3-4 times. This will be your only indicator that the monitor has been turned on.  Do not shower for the first 24 hours. You may shower after the first 24 hours.  Press the button if you feel a symptom. You will hear a small click. Record Date, Time and  Symptom in the Patient Logbook.  When you are ready to remove the patch, follow instructions on the last 2 pages of Patient  Logbook. Stick patch monitor onto the last page of Patient Logbook.  Place Patient Logbook in the blue and white box. Use locking tab on box and tape box closed  securely. The blue and white box has prepaid postage on it. Please place it in the mailbox as  soon as possible. Your physician should have your test results approximately 7 days after the  monitor has been mailed back to [facility].  Call [facility] Customer Care at [phone] if you have questions regarding  your ZIO XT patch monitor. Call them immediately if you see an orange  light blinking on your  monitor.  If your monitor falls off in less than 4 days, contact our Monitor department at [phone].  If your monitor becomes loose or falls off after 4 days call [employer] at [phone] for  suggestions on securing your monitor   Follow-Up: At [facility], you and your health needs are our priority.  As part of our continuing mission to provide you with exceptional heart care, our providers are all part of one team.  This team includes your primary Cardiologist (physician) and Advanced Practice Providers or APPs (Physician Assistants and Nurse Practitioners) who all work together to provide you with the care you need, when you need it.  Your next appointment:    4 months  Provider:   Dr. Kate   We recommend signing up for the patient portal called MyChart.  Sign up information is provided on this After Visit Summary.  MyChart is used to connect with patients for Virtual Visits (Telemedicine).  Patients are able to view lab/test results, encounter notes, upcoming appointments, etc.  Non-urgent messages can be sent to your provider as well.   To learn more about what you can do with MyChart, go to [email or web address].   Other Instructions Referral to Pharm D Please check blood pressure twice a day for one week and send those readings

## 2024-04-05 ENCOUNTER — Ambulatory Visit: Payer: Self-pay | Admitting: Cardiology

## 2024-04-08 ENCOUNTER — Ambulatory Visit: Admitting: Cardiology

## 2024-04-08 ENCOUNTER — Other Ambulatory Visit: Payer: Self-pay | Admitting: *Deleted

## 2024-04-08 DIAGNOSIS — Z0289 Encounter for other administrative examinations: Secondary | ICD-10-CM

## 2024-04-08 NOTE — Telephone Encounter (Signed)
 Copied from CRM [id number]. Topic: General - Other >> Apr 07, 2024  8:32 AM Ismael A wrote: Reason for CRM: patient is calling back regarding FMLA paperwork, she states she needs them in order to get paid and pay her bills - she is requesting to have Dr. Adrien call her back ASAP >> Apr 08, 2024 12:49 PM Corean R wrote: Patient states she has been waiting a month for her FMLA paperwork and still no one has called her back, patient states at first the clinic lost her paperwork and she re sent it and still there has been no update. Patient is at risk for losing her home and oulitis being shut off is she doesn't get this paperwork right away. Please call patient back to aldose.

## 2024-04-08 NOTE — Transitions of Care (Post Inpatient/ED Visit) (Signed)
 " Transition of Care week 4  Visit Note  04/08/2024  Name: Wendy Jensen MRN: [id number]          DOB: 1960-12-02  Situation: Patient enrolled in [facility] 30-day program. Visit completed with Ms. Bordas by telephone.   Background:   Initial Transition Care Management Follow-up Telephone Call Discharge Date and Diagnosis: 03/15/24, Acute respiratory failure with hypoxia   Past Medical History:  Diagnosis Date   GERD (gastroesophageal reflux disease)    Hypertension    Peptic ulcer    Reflux     Assessment: Patient Reported Symptoms: Cognitive Cognitive Status: Able to follow simple commands, Alert and oriented to person, place, and time, Normal speech and language skills      Neurological Neurological Review of Symptoms: No symptoms reported    HEENT HEENT Symptoms Reported: No symptoms reported      Cardiovascular Cardiovascular Symptoms Reported: No symptoms reported Does patient have uncontrolled Hypertension?: Yes Is patient checking Blood Pressure at home?: Yes Cardiovascular Comment: BP today prior to taking BP medication was 171/111, recheck after taking medication was 164/83. Seen by Cardiology on 04/04/24, no medication changes. 7-day heart monitor ordered by Cardiology. Diet and exercise reviewed.  Respiratory Respiratory Symptoms Reported: Productive cough Additional Respiratory Details: Oxygen 2L as needed. Pulmonology follow up on 04/23/24 Respiratory Management Strategies: Routine screening Respiratory Self-Management Outcome: 4 (good)  Endocrine Endocrine Symptoms Reported: No symptoms reported    Gastrointestinal Gastrointestinal Symptoms Reported: No symptoms reported      Genitourinary Genitourinary Symptoms Reported: No symptoms reported    Integumentary Integumentary Symptoms Reported: No symptoms reported    Musculoskeletal Musculoskelatal Symptoms Reviewed: No symptoms reported   Falls in the past year?: No    Psychosocial Psychosocial  Symptoms Reported: Other Other Psychosocial Conditions: Patient is having stress related to [facility] paperwork not being filled out in a timely manner. Additional Psychological Details: RNCM sent secure communication to Pulmonology/Dr. Winfred inquiring about FMLA paperwork.         Today's Vitals   04/08/24 1616  BP: (!) 164/83   Pain Score: 0-No pain  Medications Reviewed Today     Reviewed by Lucky Andrea LABOR, RN (Registered Nurse) on 04/08/24 at 1558  Med List Status: <None>   Medication Order Taking? Sig Documenting Provider Last Dose Status Informant  albuterol  (VENTOLIN  HFA) 108 (90 Base) MCG/ACT inhaler [id number] Yes Inhale 2 puffs into the lungs every 4 (four) hours as needed for wheezing or shortness of breath. Adrien Winfred, Tamala, MD  Active   budesonide -glycopyrrolate -formoterol  (BREZTRI  AEROSPHERE) 160-9-4.8 MCG/ACT AERO inhaler [id number] Yes Inhale 2 puffs into the lungs in the morning and at bedtime. Adrien Winfred, Tamala, MD  Active   carvedilol  (COREG ) 25 MG tablet [id number] Yes Take 1 tablet (25 mg total) by mouth 2 (two) times daily. Kate Lonni CROME, MD  Active   chlorthalidone  (HYGROTON ) 25 MG tablet [id number] Yes Take 25 mg by mouth daily. [provider]  Active   folic acid  (FOLVITE ) 1 MG tablet [id number] Yes Take 1 tablet (1 mg total) by mouth daily. Tanda Bleacher, MD  Active   hydrOXYzine  (ATARAX ) 25 MG tablet [id number]  Take 1 tablet (25 mg total) by mouth 3 (three) times daily as needed for anxiety.  Patient not taking: Reported on 04/08/2024   Tanda Bleacher, MD  Active   omeprazole  (PRILOSEC) 20 MG capsule [id number] Yes TAKE 1 CAPSULE(20 MG) BY MOUTH DAILY Tanda Bleacher, MD  Active Self, Pharmacy Records  rosuvastatin  (CRESTOR ) 20 MG tablet [id number]  Yes Take 1 tablet (20 mg total) by mouth daily. Perri DELENA Meliton Mickey., MD  Active   thiamine  (VITAMIN B-1) 100 MG tablet [id number] Yes Take 1 tablet (100 mg total) by mouth daily. Tanda Bleacher, MD  Active             Recommendation:   Continue Current Plan of Care  Follow Up Plan:   Telephone follow-up in 1 week  Andrea Dimes RN, BSN [place]  [facility] Institute [facility] RN Care Manager [phone]     "

## 2024-04-08 NOTE — Patient Instructions (Signed)
 Visit Information  Thank you for taking time to visit with me today. Please don't hesitate to contact me if I can be of assistance to you before our next scheduled telephone appointment.   Following is a copy of your care plan:   Goals Addressed             This Visit's Progress    VBCI Transitions of Care (TOC) Care Plan       Problems:  Recent Hospitalization for treatment of Respiratory Failure No Specialist appointment referral to Pulmonology -has not been scheduled  Goal:  Over the next 30 days, the patient will not experience hospital readmission  Interventions:  Transitions of Care: Doctor Visits  - discussed the importance of doctor visits Post discharge activity limitations prescribed by provider reviewed Medication review Reviewed BP Reviewed diet and exercise Advised taking all medications and BP readings  Communication with Dr. Winfred regarding FMLA paperwork-completed and faxed  Patient Self Care Activities:  Attend all scheduled provider appointments Call provider office for new concerns or questions  Notify RN Care Manager of [facility] call rescheduling needs Participate in Transition of Care Program/Attend TOC scheduled calls Take medications as prescribed   check blood pressure daily write blood pressure results in a log or diary keep a blood pressure log keep all doctor appointments eat more whole grains, fruits and vegetables, lean meats and healthy fats  Plan:  Telephone follow up appointment with care management team member scheduled for:  04/16/24 at 3:30pm        Patient verbalizes understanding of instructions and care plan provided today and agrees to view in MyChart. Active MyChart status and patient understanding of how to access instructions and care plan via MyChart confirmed with patient.     Telephone follow up appointment with care management team member scheduled for:04/16/24 at 3:30pm  Please call the care guide team at [phone] if  you need to cancel or reschedule your appointment.   Please call 1-800-273-TALK (toll free, 24 hour hotline) call 911 if you are experiencing a Mental Health or Behavioral Health Crisis or need someone to talk to.  Andrea Dimes RN, BSN   Value[facility]Based Care Institute [facility] RN Care Manager [phone]

## 2024-04-08 NOTE — Telephone Encounter (Signed)
 FMLA faxed to [facility] 04/08/24. Pt notified.

## 2024-04-08 NOTE — Telephone Encounter (Signed)
 Copied from CRM [id number]. Topic: General - Other >> Apr 07, 2024  8:32 AM Ismael A wrote: Reason for CRM: patient is calling back regarding FMLA paperwork, she states she needs them in order to get paid and pay her bills - she is requesting to have Dr. Adrien call her back ASAP

## 2024-04-08 NOTE — Telephone Encounter (Signed)
 Forms were received by Dr. Adrien  Routing to her to document once completed and will then call the pt  Note that her next appt here is 04/23/24 with Dr. Adrien

## 2024-04-09 NOTE — Telephone Encounter (Signed)
 Yearly FMLA Paperwork charge has been posted IAO $29 and notified PAA's for collection.

## 2024-04-14 ENCOUNTER — Other Ambulatory Visit (HOSPITAL_COMMUNITY): Payer: Self-pay

## 2024-04-14 ENCOUNTER — Telehealth: Payer: Self-pay | Admitting: Pharmacy Technician

## 2024-04-14 NOTE — Telephone Encounter (Signed)
 Wendy Jensen

## 2024-04-14 NOTE — Telephone Encounter (Signed)
" ° °  Chlorthalidone  is 23.27 for 90 days  "

## 2024-04-15 ENCOUNTER — Ambulatory Visit: Admitting: Pharmacist Clinician (PhC)/ Clinical Pharmacy Specialist

## 2024-04-16 ENCOUNTER — Other Ambulatory Visit: Payer: Self-pay | Admitting: *Deleted

## 2024-04-16 DIAGNOSIS — J9601 Acute respiratory failure with hypoxia: Secondary | ICD-10-CM

## 2024-04-16 NOTE — Telephone Encounter (Signed)
 Patient is calling back regarding FMLA paperwork, she states she needs them in order to get paid and pay her bills - she is requesting to have Dr. Adrien call her back ASAP, also called to get the return date on her FMLA.  I contacted patient who states our office did not return the forms in time. I informed patient we did not get the forms until 04/02/2024 and completed them on 04/08/2024 and we have confirmation the fax went through. Patient is upset, states original forms were sent 03/21/2024 and someone told her that were received. Contacted [employer] supervisors to see if we can pull the call to see who informed patient we received the forms on 03/21/2024 because that is incorrect. Also left a voicemail with Jayden Adams form [facility] to see if they have the forms we sent on 04/08/2024 and if there is anything we can do to get patient her paycheck.

## 2024-04-16 NOTE — Transitions of Care (Post Inpatient/ED Visit) (Signed)
 " Transition of Care week 5  Visit Note  04/16/2024  Name: Wendy Jensen MRN: [id number]          DOB: 1960/06/13  Situation: Patient enrolled in [facility] 30-day program. Visit completed with Wendy Jensen by telephone.   Background:   Initial Transition Care Management Follow-up Telephone Call Discharge Date and Diagnosis: 03/15/24, Acute respiratory failure with hypoxia   Past Medical History:  Diagnosis Date   GERD (gastroesophageal reflux disease)    Hypertension    Peptic ulcer    Reflux     Assessment: Patient Reported Symptoms: Cognitive Cognitive Status: Able to follow simple commands, Alert and oriented to person, place, and time, Normal speech and language skills      Neurological Neurological Review of Symptoms: No symptoms reported    HEENT HEENT Symptoms Reported: Not assessed      Cardiovascular Cardiovascular Symptoms Reported: No symptoms reported Does patient have uncontrolled Hypertension?: Yes Is patient checking Blood Pressure at home?: Yes Patient's Recent BP reading at home: Patient did not check BP today, yesterday was 146/93, pulse 77 Cardiovascular Management Strategies: Medication therapy, Routine screening, Coping strategies, Diet modification, Adequate rest Cardiovascular Self-Management Outcome: 3 (uncertain) Cardiovascular Comment: Wearing 7 day heart monitor, advised exercise 30 minutes daily, patient's neighbor is going to help her get medication refill.  Respiratory Respiratory Symptoms Reported: No symptoms reported Respiratory Management Strategies: Adequate rest, Routine screening, Coping strategies Respiratory Self-Management Outcome: 4 (good)  Endocrine Endocrine Symptoms Reported: Not assessed    Gastrointestinal Gastrointestinal Symptoms Reported: No symptoms reported      Genitourinary Genitourinary Symptoms Reported: No symptoms reported    Integumentary Integumentary Symptoms Reported: No symptoms reported    Musculoskeletal  Musculoskelatal Symptoms Reviewed: No symptoms reported        Psychosocial Psychosocial Symptoms Reported: Other Other Psychosocial Conditions: Patient is having increased stress today related to not getting paid for 2 weeks due to [facility] paperwork delay. Additional Psychological Details: Advised asking friends and family for assistance until she gets paid in 2 weeks.         There were no vitals filed for this visit. Pain Score: 0-No pain  Medications Reviewed Today     Reviewed by Lucky Andrea LABOR, RN (Registered Nurse) on 04/16/24 at 1528  Med List Status: <None>   Medication Order Taking? Sig Documenting Provider Last Dose Status Informant  albuterol  (VENTOLIN  HFA) 108 (90 Base) MCG/ACT inhaler [id number] Yes Inhale 2 puffs into the lungs every 4 (four) hours as needed for wheezing or shortness of breath. Adrien Guan, Tamala, MD  Active   budesonide -glycopyrrolate -formoterol  (BREZTRI  AEROSPHERE) 160-9-4.8 MCG/ACT AERO inhaler [id number] Yes Inhale 2 puffs into the lungs in the morning and at bedtime. Adrien Guan, Tamala, MD  Active   carvedilol  (COREG ) 25 MG tablet [id number] Yes Take 1 tablet (25 mg total) by mouth 2 (two) times daily. Kate Lonni CROME, MD  Active   chlorthalidone  (HYGROTON ) 25 MG tablet [id number] Yes Take 25 mg by mouth daily. [provider]  Active   folic acid  (FOLVITE ) 1 MG tablet [id number] Yes Take 1 tablet (1 mg total) by mouth daily. Tanda Bleacher, MD  Active   hydrOXYzine  (ATARAX ) 25 MG tablet [id number]  Take 1 tablet (25 mg total) by mouth 3 (three) times daily as needed for anxiety.  Patient not taking: Reported on 04/16/2024   Tanda Bleacher, MD  Active   omeprazole  (PRILOSEC) 20 MG capsule [id number] Yes TAKE 1 CAPSULE(20 MG) BY MOUTH DAILY Tanda Bleacher, MD  Active Self, Pharmacy Records  rosuvastatin  (CRESTOR ) 20 MG tablet [id number] Yes Take 1 tablet (20 mg total) by mouth daily. Perri DELENA Meliton Mickey., MD  Active   thiamine   (VITAMIN B-1) 100 MG tablet [id number] Yes Take 1 tablet (100 mg total) by mouth daily. Tanda Bleacher, MD  Active             Recommendation:   Continue Current Plan of Care  Follow Up Plan:   Referral to RN Case Manager Closing From:  Transitions of Care Program  Andrea Dimes RN, BSN [place]  Value-Based Care Institute [facility] RN Care Manager [phone]     "

## 2024-04-17 ENCOUNTER — Telehealth: Payer: Self-pay | Admitting: *Deleted

## 2024-04-17 NOTE — Progress Notes (Unsigned)
 Complex Care Management Note Care Guide Note  04/17/2024 Name: Wendy Jensen MRN: [id number] DOB: Mar 18, 1960   Complex Care Management Outreach Attempts: An unsuccessful telephone outreach was attempted today to offer the patient information about available complex care management services.  Follow Up Plan:  Additional outreach attempts will be made to offer the patient complex care management information and services.   Encounter Outcome:  No Answer  Harlene Satterfield  [facility]  [facility], [facility] Guide  Direct Dial: [phone]  Fax [phone]

## 2024-04-17 NOTE — Telephone Encounter (Signed)
 Copied from CRM [id number]. Topic: General - Other >> Apr 17, 2024 10:17 AM Rozanna MATSU wrote: Reason for CRM: pt calling about her return to work on her FMLA paperwork. I attempted to call CAL several times no answer. She is very upset. Stated she has called several times and the doctor is not calling her back. She just wants to confirm the date of her return to work on the [name] forms. She is stating her job states there is a different date on the form.

## 2024-04-18 ENCOUNTER — Telehealth: Payer: Self-pay | Admitting: Cardiology

## 2024-04-18 NOTE — Telephone Encounter (Signed)
 NT called in on behalf of patient, with patient on her line--Patient is very upset that she has not received a call back regarding lab results. She is requesting feedback as soon as possible. Please advise.

## 2024-04-18 NOTE — Telephone Encounter (Signed)
 Spoke with [employer], she just needs a letter the day she comes back for her f/u on 2/11      Return - to work 04/27/2024

## 2024-04-18 NOTE — Progress Notes (Signed)
 Complex Care Management Note  Care Guide Note 04/18/2024 Name: Jesselle Laflamme MRN: [id number] DOB: June 05, 1960  Zanaiya Valade is a 64 y.o. year old female who sees Tanda Bleacher, MD for primary care. I reached out to [employer] by phone today to offer complex care management services.  Ms. Pickerill was given information about Complex Care Management services today including:   The Complex Care Management services include support from the care team which includes your Nurse Care Manager, Clinical Social Worker, or Pharmacist.  The Complex Care Management team is here to help remove barriers to the health concerns and goals most important to you. Complex Care Management services are voluntary, and the patient may decline or stop services at any time by request to their care team member.   Complex Care Management Consent Status: Patient agreed to services and verbal consent obtained.   Follow up plan:  Telephone appointment with complex care management team member scheduled for:  05/01/24  Encounter Outcome:  Patient Scheduled  Harlene Satterfield  [facility]  [facility], [facility], [facility] Guide  Direct Dial: [phone]  Fax [phone]

## 2024-04-23 ENCOUNTER — Ambulatory Visit

## 2024-05-01 ENCOUNTER — Telehealth

## 2024-05-27 ENCOUNTER — Ambulatory Visit: Admitting: Pharmacist Clinician (PhC)/ Clinical Pharmacy Specialist

## 2024-09-23 ENCOUNTER — Ambulatory Visit: Payer: Self-pay | Admitting: Family Medicine
